# Patient Record
Sex: Male | Born: 1950 | Hispanic: No | Marital: Single | Smoking: Former smoker
Health system: Southern US, Community
[De-identification: ages and names within clinical notes are randomized; demographics above are authoritative.]

## PROBLEM LIST (undated history)

## (undated) DIAGNOSIS — M1990 Unspecified osteoarthritis, unspecified site: Secondary | ICD-10-CM

## (undated) DIAGNOSIS — K625 Hemorrhage of anus and rectum: Secondary | ICD-10-CM

## (undated) DIAGNOSIS — E119 Type 2 diabetes mellitus without complications: Secondary | ICD-10-CM

## (undated) DIAGNOSIS — I10 Essential (primary) hypertension: Secondary | ICD-10-CM

## (undated) DIAGNOSIS — K469 Unspecified abdominal hernia without obstruction or gangrene: Secondary | ICD-10-CM

## (undated) DIAGNOSIS — K7460 Unspecified cirrhosis of liver: Secondary | ICD-10-CM

## (undated) HISTORY — PX: FRACTURE SURGERY: SHX138

---

## 1996-01-24 HISTORY — PX: PELVIC FRACTURE SURGERY: SHX119

## 2000-05-16 ENCOUNTER — Emergency Department (HOSPITAL_COMMUNITY): Admission: EM | Admit: 2000-05-16 | Discharge: 2000-05-16 | Payer: Self-pay | Admitting: Emergency Medicine

## 2000-05-16 ENCOUNTER — Encounter: Payer: Self-pay | Admitting: Emergency Medicine

## 2000-05-30 ENCOUNTER — Encounter: Admission: RE | Admit: 2000-05-30 | Discharge: 2000-05-30 | Payer: Self-pay | Admitting: Hematology and Oncology

## 2000-07-02 ENCOUNTER — Emergency Department (HOSPITAL_COMMUNITY): Admission: EM | Admit: 2000-07-02 | Discharge: 2000-07-02 | Payer: Self-pay

## 2002-11-25 ENCOUNTER — Emergency Department (HOSPITAL_COMMUNITY): Admission: EM | Admit: 2002-11-25 | Discharge: 2002-11-25 | Payer: Self-pay | Admitting: Emergency Medicine

## 2004-04-03 ENCOUNTER — Emergency Department (HOSPITAL_COMMUNITY): Admission: EM | Admit: 2004-04-03 | Discharge: 2004-04-03 | Payer: Self-pay | Admitting: Emergency Medicine

## 2004-04-05 ENCOUNTER — Emergency Department (HOSPITAL_COMMUNITY): Admission: EM | Admit: 2004-04-05 | Discharge: 2004-04-05 | Payer: Self-pay | Admitting: Family Medicine

## 2004-04-09 ENCOUNTER — Emergency Department (HOSPITAL_COMMUNITY): Admission: EM | Admit: 2004-04-09 | Discharge: 2004-04-09 | Payer: Self-pay | Admitting: Emergency Medicine

## 2004-04-18 ENCOUNTER — Emergency Department (HOSPITAL_COMMUNITY): Admission: EM | Admit: 2004-04-18 | Discharge: 2004-04-18 | Payer: Self-pay | Admitting: Emergency Medicine

## 2004-11-09 ENCOUNTER — Emergency Department (HOSPITAL_COMMUNITY): Admission: EM | Admit: 2004-11-09 | Discharge: 2004-11-09 | Payer: Self-pay | Admitting: Emergency Medicine

## 2004-11-12 ENCOUNTER — Emergency Department (HOSPITAL_COMMUNITY): Admission: EM | Admit: 2004-11-12 | Discharge: 2004-11-12 | Payer: Self-pay | Admitting: Emergency Medicine

## 2004-11-13 ENCOUNTER — Emergency Department (HOSPITAL_COMMUNITY): Admission: EM | Admit: 2004-11-13 | Discharge: 2004-11-13 | Payer: Self-pay | Admitting: *Deleted

## 2005-03-10 ENCOUNTER — Emergency Department (HOSPITAL_COMMUNITY): Admission: EM | Admit: 2005-03-10 | Discharge: 2005-03-10 | Payer: Self-pay | Admitting: Family Medicine

## 2005-03-11 ENCOUNTER — Emergency Department (HOSPITAL_COMMUNITY): Admission: EM | Admit: 2005-03-11 | Discharge: 2005-03-11 | Payer: Self-pay | Admitting: Family Medicine

## 2006-01-19 ENCOUNTER — Emergency Department (HOSPITAL_COMMUNITY): Admission: EM | Admit: 2006-01-19 | Discharge: 2006-01-19 | Payer: Self-pay | Admitting: Emergency Medicine

## 2006-03-31 ENCOUNTER — Ambulatory Visit: Payer: Self-pay | Admitting: Internal Medicine

## 2006-03-31 ENCOUNTER — Inpatient Hospital Stay (HOSPITAL_COMMUNITY): Admission: EM | Admit: 2006-03-31 | Discharge: 2006-04-02 | Payer: Self-pay | Admitting: Emergency Medicine

## 2006-04-02 ENCOUNTER — Encounter: Payer: Self-pay | Admitting: Internal Medicine

## 2007-07-20 ENCOUNTER — Emergency Department (HOSPITAL_COMMUNITY): Admission: EM | Admit: 2007-07-20 | Discharge: 2007-07-20 | Payer: Self-pay | Admitting: Emergency Medicine

## 2007-11-24 ENCOUNTER — Emergency Department (HOSPITAL_COMMUNITY): Admission: EM | Admit: 2007-11-24 | Discharge: 2007-11-24 | Payer: Self-pay | Admitting: Emergency Medicine

## 2008-01-30 ENCOUNTER — Encounter: Admission: RE | Admit: 2008-01-30 | Discharge: 2008-01-30 | Payer: Self-pay | Admitting: Occupational Medicine

## 2008-08-23 ENCOUNTER — Emergency Department (HOSPITAL_COMMUNITY): Admission: EM | Admit: 2008-08-23 | Discharge: 2008-08-23 | Payer: Self-pay | Admitting: Emergency Medicine

## 2008-08-29 ENCOUNTER — Emergency Department (HOSPITAL_COMMUNITY): Admission: EM | Admit: 2008-08-29 | Discharge: 2008-08-29 | Payer: Self-pay | Admitting: Emergency Medicine

## 2008-09-20 ENCOUNTER — Emergency Department (HOSPITAL_COMMUNITY): Admission: EM | Admit: 2008-09-20 | Discharge: 2008-09-20 | Payer: Self-pay | Admitting: Emergency Medicine

## 2009-05-06 ENCOUNTER — Emergency Department (HOSPITAL_COMMUNITY): Admission: EM | Admit: 2009-05-06 | Discharge: 2009-05-06 | Payer: Self-pay | Admitting: Emergency Medicine

## 2009-05-17 ENCOUNTER — Emergency Department (HOSPITAL_COMMUNITY): Admission: EM | Admit: 2009-05-17 | Discharge: 2009-05-17 | Payer: Self-pay | Admitting: Emergency Medicine

## 2009-11-04 ENCOUNTER — Emergency Department (HOSPITAL_COMMUNITY): Admission: EM | Admit: 2009-11-04 | Discharge: 2009-11-04 | Payer: Self-pay | Admitting: Emergency Medicine

## 2010-02-08 ENCOUNTER — Emergency Department (HOSPITAL_COMMUNITY)
Admission: EM | Admit: 2010-02-08 | Discharge: 2010-02-09 | Payer: Self-pay | Source: Home / Self Care | Admitting: Emergency Medicine

## 2010-02-09 LAB — CBC
HCT: 41.7 % (ref 39.0–52.0)
Hemoglobin: 14.5 g/dL (ref 13.0–17.0)
MCH: 31.3 pg (ref 26.0–34.0)
MCHC: 34.8 g/dL (ref 30.0–36.0)
MCV: 89.9 fL (ref 78.0–100.0)
Platelets: 128 10*3/uL — ABNORMAL LOW (ref 150–400)
RBC: 4.64 MIL/uL (ref 4.22–5.81)
RDW: 13.4 % (ref 11.5–15.5)
WBC: 4.5 10*3/uL (ref 4.0–10.5)

## 2010-02-09 LAB — DIFFERENTIAL
Basophils Absolute: 0 10*3/uL (ref 0.0–0.1)
Basophils Relative: 1 % (ref 0–1)
Eosinophils Absolute: 0.1 10*3/uL (ref 0.0–0.7)
Eosinophils Relative: 1 % (ref 0–5)
Lymphocytes Relative: 35 % (ref 12–46)
Lymphs Abs: 1.6 10*3/uL (ref 0.7–4.0)
Monocytes Absolute: 0.5 10*3/uL (ref 0.1–1.0)
Monocytes Relative: 12 % (ref 3–12)
Neutro Abs: 2.3 10*3/uL (ref 1.7–7.7)
Neutrophils Relative %: 51 % (ref 43–77)

## 2010-02-09 LAB — BASIC METABOLIC PANEL
BUN: 10 mg/dL (ref 6–23)
CO2: 25 mEq/L (ref 19–32)
Calcium: 8.5 mg/dL (ref 8.4–10.5)
Chloride: 98 mEq/L (ref 96–112)
Creatinine, Ser: 0.72 mg/dL (ref 0.4–1.5)
GFR calc Af Amer: >60 mL/min (ref >60)
GFR calc non Af Amer: >60 mL/min (ref >60)
Glucose, Bld: 97 mg/dL (ref 70–99)
Potassium: 4.1 mEq/L (ref 3.5–5.1)
Sodium: 130 mEq/L — ABNORMAL LOW (ref 135–145)

## 2010-02-09 LAB — URINALYSIS, ROUTINE W REFLEX MICROSCOPIC
Bilirubin Urine: NEGATIVE
Hgb urine dipstick: NEGATIVE
Ketones, ur: NEGATIVE mg/dL
Nitrite: NEGATIVE
Protein, ur: NEGATIVE mg/dL
Specific Gravity, Urine: 1.038 — ABNORMAL HIGH (ref 1.005–1.030)
Urine Glucose, Fasting: NEGATIVE mg/dL
Urobilinogen, UA: 0.2 mg/dL (ref 0.0–1.0)
pH: 6 (ref 5.0–8.0)

## 2010-06-10 NOTE — Discharge Summary (Signed)
[name], [name]                ACCOUNT NO.:  [id number]   MEDICAL RECORD NO.:  [id number]          PATIENT TYPE:  INP   LOCATION:  2016                         FACILITY:  [facility]   PHYSICIAN:  Gardiner Barefoot, MD    DATE OF BIRTH:  08-28-1950   DATE OF ADMISSION:  03/31/2006  DATE OF DISCHARGE:  04/02/2006                               DISCHARGE SUMMARY   REASON FOR ADMISSION:  Chest pain.   DISCHARGE DIAGNOSIS:  Resolved chest pain secondary to crack cocaine  use.   HISTORY OF PRESENT ILLNESS:  Please see dictated history and physical  already dictated.  Briefly, this is a 60 year old male with no known  cardiac history and no physician who presented with about a two week  history of this malaise and flu-like symptoms, some subjective fevers,  sweats and some weakness and had unstable angina symptoms with 6-7/10  retrosternal chest pain and some shortness of breath.  The patient said  that occurred prior 4-5 days prior to coming into the hospital and was  not pleuritic in nature and would last about 15-20 minutes and then  resolve spontaneously.  He continued to work as a Scientist, water quality, doing  heavy exertion, but was limited by the chest pain.  In evaluation in the  emergency room, his EKG was noted to have no acute ST changes although  one EKG did report some PACs and had a bradycardiac episode after a  nitroglycerin secondary to vasovagal event which responded well to  atropine.  The patient denied any significant risk factors for cardiac  disease initially with admitting to just about 1 cigarette weekly,  although a significant history of smoking and denied any alcohol or drug  use.  However, the patient upon further questioning by myself and the  Cardiologist it was revealed that the patient has had a recent history  of crack cocaine use and in fact his urine drug screen was positive for  cocaine this hospitalization.  The patient was then admitted for rule  out of cardiac  disease and risk stratification.   ALLERGIES:  No known drug allergies.   MEDICATIONS:  No home medications prior to the hospitalization.   HOSPITAL COURSE:  The patient was admitted and ruled out with negative  enzymes and had an echocardiogram which was significant for diastolic  dysfunction with an EF of 65-70% and mildly increased left ventricular  wall thickness.  Otherwise no pericardial effusion or other  abnormalities noted.  The patient remained chest pain-free throughout  his hospital stay and Cardiology did feel that this would unlikely be a  cardiac origin of chest pain, it is more likely secondary to drug use  and was resolving.  The patient also was seen by the social worker which  gave him contacts for drug and alcohol rehabilitation.  The patient the  day of discharge was eager to leave, in fact was becoming agitated with  nursing staff and it was felt that there was no further need for in  patient work up of his noncardiac chest pain.  Additionally, the patient  was  found to be smoking in his bathroom in the hospital room.  The  patient was given contact information for the primary care Maleena Eddleman and  encouraged to follow up and continue with a daily aspirin.   For hypertriglyceridemia:  The patient was noted to have significantly  elevated hypertriglycerides in the 600s, however, also with elevated  LFTs likely secondary to his chronic alcohol abuse.  The fibrin was not  started in hospital due to his increased LFTs but it was recommended  that he have follow up with his primary care physician about his  triglycerides for question of starting gemfibrozil.   The patient was stable upon discharge, in no pain and acknowledged his  need to follow up closely with a new physician.      Gardiner Barefoot, MD  Electronically Signed     RWC/MEDQ  D:  04/03/2006  T:  04/05/2006  Job:  [id number]
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
## 2010-07-10 ENCOUNTER — Emergency Department (HOSPITAL_COMMUNITY)
Admission: EM | Admit: 2010-07-10 | Discharge: 2010-07-11 | Disposition: A | Discharge status: Home / Self Care | Payer: Self-pay | Attending: Emergency Medicine | Admitting: Emergency Medicine

## 2010-07-10 DIAGNOSIS — M25559 Pain in unspecified hip: Secondary | ICD-10-CM | POA: Insufficient documentation

## 2010-07-10 DIAGNOSIS — X58XXXA Exposure to other specified factors, initial encounter: Secondary | ICD-10-CM | POA: Insufficient documentation

## 2010-07-10 DIAGNOSIS — S139XXA Sprain of joints and ligaments of unspecified parts of neck, initial encounter: Secondary | ICD-10-CM | POA: Insufficient documentation

## 2010-07-10 DIAGNOSIS — M533 Sacrococcygeal disorders, not elsewhere classified: Secondary | ICD-10-CM | POA: Insufficient documentation

## 2010-07-10 DIAGNOSIS — M542 Cervicalgia: Secondary | ICD-10-CM | POA: Insufficient documentation

## 2010-07-11 ENCOUNTER — Emergency Department (HOSPITAL_COMMUNITY): Payer: Self-pay

## 2010-11-12 ENCOUNTER — Emergency Department (HOSPITAL_COMMUNITY)
Admission: EM | Admit: 2010-11-12 | Discharge: 2010-11-13 | Disposition: A | Discharge status: Home / Self Care | Payer: Self-pay | Attending: Emergency Medicine | Admitting: Emergency Medicine

## 2010-11-12 DIAGNOSIS — M25559 Pain in unspecified hip: Secondary | ICD-10-CM | POA: Insufficient documentation

## 2010-11-12 DIAGNOSIS — M76899 Other specified enthesopathies of unspecified lower limb, excluding foot: Secondary | ICD-10-CM | POA: Insufficient documentation

## 2010-11-12 DIAGNOSIS — F411 Generalized anxiety disorder: Secondary | ICD-10-CM | POA: Insufficient documentation

## 2010-11-13 ENCOUNTER — Emergency Department (HOSPITAL_COMMUNITY): Payer: Self-pay

## 2010-12-29 ENCOUNTER — Encounter: Payer: Self-pay | Admitting: Emergency Medicine

## 2010-12-29 ENCOUNTER — Emergency Department (HOSPITAL_COMMUNITY)
Admission: EM | Admit: 2010-12-29 | Discharge: 2010-12-30 | Disposition: A | Discharge status: Home / Self Care | Payer: Self-pay | Attending: Emergency Medicine | Admitting: Emergency Medicine

## 2010-12-29 DIAGNOSIS — L0291 Cutaneous abscess, unspecified: Secondary | ICD-10-CM

## 2010-12-29 DIAGNOSIS — L0231 Cutaneous abscess of buttock: Secondary | ICD-10-CM | POA: Insufficient documentation

## 2010-12-29 DIAGNOSIS — L0390 Cellulitis, unspecified: Secondary | ICD-10-CM

## 2010-12-29 NOTE — ED Notes (Signed)
PT. REPORTS ABSCESS AT RIGHT BUTTOCK FOR 4 DAYS WITH DRAINAGE , LOW GRADE FEVER AND SLIGHT CHILLS.

## 2010-12-30 MED ORDER — DOXYCYCLINE HYCLATE 100 MG PO TABS
100.0000 mg | ORAL_TABLET | Freq: Once | ORAL | Status: AC
  Administered 2010-12-30: 100 mg via ORAL
  Filled 2010-12-30: qty 1

## 2010-12-30 MED ORDER — HYDROCODONE-ACETAMINOPHEN 5-325 MG PO TABS: 2.0000 | ORAL_TABLET | Freq: Four times a day (QID) | ORAL | Status: DC | PRN

## 2010-12-30 MED ORDER — OXYCODONE-ACETAMINOPHEN 5-325 MG PO TABS
2.0000 | ORAL_TABLET | Freq: Once | ORAL | Status: AC
  Administered 2010-12-30: 2 via ORAL
  Filled 2010-12-30: qty 2

## 2010-12-30 MED ORDER — DOXYCYCLINE HYCLATE 100 MG PO CAPS: 100.0000 mg | ORAL_CAPSULE | Freq: Two times a day (BID) | ORAL | Status: DC

## 2010-12-30 MED ORDER — LIDOCAINE HCL (PF) 1 % IJ SOLN
2.0000 mL | Freq: Once | INTRAMUSCULAR | Status: AC
  Administered 2010-12-30: 2 mL via INTRADERMAL
  Filled 2010-12-30: qty 5

## 2010-12-30 NOTE — ED Provider Notes (Signed)
History     CSN: [id number] Arrival date & time: 12/29/2010  9:14 PM   First MD Initiated Contact with Patient 12/29/10 2342      Chief Complaint  Patient presents with  . Abscess    (Consider location/radiation/quality/duration/timing/severity/associated sxs/prior treatment) HPI Comments: [name] has an abscess on his right buttock at the gluteal fold, but not involving the anus.  This is been progressive over the last 4 days.  He is tried warm soaks and over-the-counter ibuprofen or Tylenol without relief.  Denies previous history of abscesses  Patient is a 60 y.o. male presenting with abscess. The history is provided by the patient.  Abscess  This is a new problem. The current episode started less than one week ago. The problem occurs rarely. The problem has been unchanged. The abscess is present on the right buttock. The problem is moderate. The abscess is characterized by redness and painfulness. The patient was exposed to OTC medications. The abscess first occurred at home. Pertinent negatives include no fever. There were no sick contacts. He has received no recent medical care.    History reviewed. No pertinent past medical history.  History reviewed. No pertinent past surgical history.  No family history on file.  History  Substance Use Topics  . Smoking status: Never Smoker   . Smokeless tobacco: Not on file  . Alcohol Use: Yes      Review of Systems  Constitutional: Negative for fever.  HENT: Negative.   Eyes: Negative.   Respiratory: Negative.   Gastrointestinal: Negative for blood in stool, abdominal distention, anal bleeding and rectal pain.  Genitourinary: Negative.   Musculoskeletal: Negative.   Skin: Positive for wound.  Neurological: Negative.   Hematological: Negative.   Psychiatric/Behavioral: Negative.     Allergies  Review of patient's allergies indicates no known allergies.  Home Medications   Current Outpatient Rx  Name Route Sig  Dispense Refill  . DOXYCYCLINE HYCLATE 100 MG PO CAPS Oral Take 1 capsule (100 mg total) by mouth 2 (two) times daily. 20 capsule 0  . HYDROCODONE-ACETAMINOPHEN 5-325 MG PO TABS Oral Take 2 tablets by mouth every 6 (six) hours as needed for pain. 30 tablet 0    BP 141/107  Pulse 95  Temp(Src) 97.8 F (36.6 C) (Oral)  Resp 18  SpO2 96%  Physical Exam  Constitutional: He is oriented to person, place, and time. He appears well-developed and well-nourished.  Eyes: Pupils are equal, round, and reactive to light.  Neck: Normal range of motion.  Cardiovascular: Normal rate.   Pulmonary/Chest: Effort normal.  Abdominal: Soft.  Musculoskeletal: Normal range of motion.  Neurological: He is oriented to person, place, and time.  Skin:       Small fluctuant area surrounded by 6 cm round area of firm cellulitis   Psychiatric: He has a normal mood and affect.    ED Course  INCISION AND DRAINAGE Date/Time: 12/30/2010 1:17 AM Performed by: Arman Filter Authorized by: Arman Filter Consent: Verbal consent obtained. Consent given by: patient Patient understanding: patient states understanding of the procedure being performed Site marked: the operative site was not marked Required items: required blood products, implants, devices, and special equipment available Patient identity confirmed: verbally with patient Time out: Immediately prior to procedure a "time out" was called to verify the correct patient, procedure, equipment, support staff and site/side marked as required. Type: abscess Body area: anogenital Anesthesia: local infiltration Local anesthetic: lidocaine 1% without epinephrine Patient sedated: no Scalpel size:  11 Needle gauge: 22 Incision type: single straight Complexity: simple Drainage: purulent Drainage amount: moderate Wound treatment: drain placed Packing material: 1/4 in iodoform gauze Patient tolerance: Patient tolerated the procedure well with no immediate  complications.   (including critical care time)  Labs Reviewed - No data to display No results found.   1. Abscess and cellulitis       MDM  Abscess with cellulitis will I&D prescribed antibiotics, pain control and have patient return in 2 days for re\re examination of the wound as he has no primary care doctor        Arman Filter, NP 12/30/10 0119  Arman Filter, NP 12/30/10 0120

## 2010-12-30 NOTE — ED Notes (Signed)
Pt c/o abscess to R buttock.  Redness and pus draining from R buttock.

## 2010-12-30 NOTE — ED Provider Notes (Signed)
Medical screening examination/treatment/procedure(s) were performed by non-physician practitioner and as supervising physician I was immediately available for consultation/collaboration.  Kendel Pesnell M Chirag Krueger, MD 12/30/10 1746 

## 2010-12-30 NOTE — ED Notes (Signed)
Dry dressing placed on pt.

## 2010-12-31 ENCOUNTER — Encounter (HOSPITAL_COMMUNITY): Payer: Self-pay | Admitting: Emergency Medicine

## 2010-12-31 ENCOUNTER — Emergency Department (HOSPITAL_COMMUNITY)
Admission: EM | Admit: 2010-12-31 | Discharge: 2010-12-31 | Disposition: A | Discharge status: Home / Self Care | Payer: Self-pay | Attending: Emergency Medicine | Admitting: Emergency Medicine

## 2010-12-31 DIAGNOSIS — Z09 Encounter for follow-up examination after completed treatment for conditions other than malignant neoplasm: Secondary | ICD-10-CM | POA: Insufficient documentation

## 2010-12-31 DIAGNOSIS — IMO0001 Reserved for inherently not codable concepts without codable children: Secondary | ICD-10-CM | POA: Insufficient documentation

## 2010-12-31 DIAGNOSIS — L0231 Cutaneous abscess of buttock: Secondary | ICD-10-CM | POA: Insufficient documentation

## 2010-12-31 MED ORDER — HYDROCODONE-ACETAMINOPHEN 5-325 MG PO TABS
2.0000 | ORAL_TABLET | Freq: Once | ORAL | Status: AC
  Administered 2010-12-31: 2 via ORAL
  Filled 2010-12-31: qty 2

## 2010-12-31 MED ORDER — HYDROCODONE-ACETAMINOPHEN 5-500 MG PO TABS: 1.0000 | ORAL_TABLET | Freq: Four times a day (QID) | ORAL | Status: AC | PRN

## 2010-12-31 NOTE — ED Provider Notes (Addendum)
History     CSN: [id number] Arrival date & time: 12/31/2010  6:26 PM   First MD Initiated Contact with Patient 12/31/10 1949      Chief Complaint  Patient presents with  . Wound Check    Right buttock area    (Consider location/radiation/quality/duration/timing/severity/associated sxs/prior treatment) Patient is a 60 y.o. male presenting with wound check. The history is provided by the patient.  Wound Check   pt had I and D of right buttock abscess 2 days ago. Presents for recheck. Says pain improved, but persists. No fever or chills. Still taking antibiotic. Does not feel ill, no weakness, no nv, no chills/sweats. States is almost out of pain meds. Constant, dull soreness to area, no radiation, worse w palpation, but improved from prior.   History reviewed. No pertinent past medical history.  Past Surgical History  Procedure Date  . Pelvic fracture surgery     History reviewed. No pertinent family history.  History  Substance Use Topics  . Smoking status: Never Smoker   . Smokeless tobacco: Not on file  . Alcohol Use: Yes      Review of Systems  Constitutional: Negative for fever and chills.  Gastrointestinal: Negative for abdominal pain.  Musculoskeletal: Negative for myalgias.  Skin: Negative for rash.    Allergies  Review of patient's allergies indicates no known allergies.  Home Medications   Current Outpatient Rx  Name Route Sig Dispense Refill  . DOXYCYCLINE HYCLATE 100 MG PO CAPS Oral Take 100 mg by mouth 2 (two) times daily.      Marland Kitchen HYDROCODONE-ACETAMINOPHEN 5-325 MG PO TABS Oral Take 2 tablets by mouth every 6 (six) hours as needed. For pain       BP 145/100  Pulse 86  Temp(Src) 98 F (36.7 C) (Oral)  Resp 20  SpO2 96%  Physical Exam  Nursing note and vitals reviewed. Constitutional: He is oriented to person, place, and time. He appears well-developed and well-nourished. No distress.  HENT:  Head: Atraumatic.  Eyes: Pupils are equal, round,  and reactive to light.  Neck: Neck supple. No tracheal deviation present.  Cardiovascular: Normal rate.   Pulmonary/Chest: Effort normal. No accessory muscle usage. No respiratory distress.  Abdominal: Soft. He exhibits no distension. There is no tenderness.  Musculoskeletal: Normal range of motion. He exhibits no edema.  Neurological: He is alert and oriented to person, place, and time.  Skin: Skin is warm and dry.       Drained abscess to right buttock. Area of erythema much improved as compared to recent prior exam. No fluctuance.   Psychiatric: He has a normal mood and affect.    ED Course  Procedures (including critical care time)     MDM   No fluctuance and area of erythema much improved. Pt requests small quantity pain med.    Pt says has ride, did not drive to ed. No pain meds pta. vicodin po.    Suzi Roots, MD 12/31/10 1953  Suzi Roots, MD 12/31/10 [phone]

## 2010-12-31 NOTE — ED Notes (Signed)
Pt reports boil to right buttock area x 7 days.

## 2011-01-25 ENCOUNTER — Emergency Department (HOSPITAL_COMMUNITY)
Admission: EM | Admit: 2011-01-25 | Discharge: 2011-01-26 | Disposition: A | Discharge status: Home / Self Care | Payer: Self-pay | Attending: Emergency Medicine | Admitting: Emergency Medicine

## 2011-01-25 ENCOUNTER — Encounter (HOSPITAL_COMMUNITY): Payer: Self-pay | Admitting: *Deleted

## 2011-01-25 ENCOUNTER — Emergency Department (HOSPITAL_COMMUNITY): Payer: Self-pay

## 2011-01-25 DIAGNOSIS — R61 Generalized hyperhidrosis: Secondary | ICD-10-CM | POA: Insufficient documentation

## 2011-01-25 DIAGNOSIS — M549 Dorsalgia, unspecified: Secondary | ICD-10-CM | POA: Insufficient documentation

## 2011-01-25 DIAGNOSIS — G43909 Migraine, unspecified, not intractable, without status migrainosus: Secondary | ICD-10-CM | POA: Insufficient documentation

## 2011-01-25 DIAGNOSIS — R51 Headache: Secondary | ICD-10-CM

## 2011-01-25 DIAGNOSIS — B9789 Other viral agents as the cause of diseases classified elsewhere: Secondary | ICD-10-CM | POA: Insufficient documentation

## 2011-01-25 DIAGNOSIS — B349 Viral infection, unspecified: Secondary | ICD-10-CM

## 2011-01-25 LAB — DIFFERENTIAL
Basophils Absolute: 0 10*3/uL (ref 0.0–0.1)
Basophils Relative: 1 % (ref 0–1)
Eosinophils Absolute: 0.1 10*3/uL (ref 0.0–0.7)
Eosinophils Relative: 2 % (ref 0–5)
Lymphocytes Relative: 40 % (ref 12–46)
Lymphs Abs: 2.1 10*3/uL (ref 0.7–4.0)
Monocytes Absolute: 0.6 10*3/uL (ref 0.1–1.0)
Monocytes Relative: 11 % (ref 3–12)
Neutro Abs: 2.4 10*3/uL (ref 1.7–7.7)
Neutrophils Relative %: 46 % (ref 43–77)

## 2011-01-25 LAB — BASIC METABOLIC PANEL
BUN: 10 mg/dL (ref 6–23)
CO2: 26 mEq/L (ref 19–32)
Calcium: 9.4 mg/dL (ref 8.4–10.5)
Chloride: 104 mEq/L (ref 96–112)
Creatinine, Ser: 0.73 mg/dL (ref 0.50–1.35)
GFR calc Af Amer: >90 mL/min (ref >90)
GFR calc non Af Amer: >90 mL/min (ref >90)
Glucose, Bld: 115 mg/dL — ABNORMAL HIGH (ref 70–99)
Potassium: 4.1 mEq/L (ref 3.5–5.1)
Sodium: 139 mEq/L (ref 135–145)

## 2011-01-25 LAB — CBC
HCT: 41.2 % (ref 39.0–52.0)
Hemoglobin: 14.4 g/dL (ref 13.0–17.0)
MCH: 31.8 pg (ref 26.0–34.0)
MCHC: 35 g/dL (ref 30.0–36.0)
MCV: 90.9 fL (ref 78.0–100.0)
Platelets: 136 10*3/uL — ABNORMAL LOW (ref 150–400)
RBC: 4.53 MIL/uL (ref 4.22–5.81)
RDW: 12.6 % (ref 11.5–15.5)
WBC: 5.3 10*3/uL (ref 4.0–10.5)

## 2011-01-25 MED ORDER — DEXAMETHASONE SODIUM PHOSPHATE 10 MG/ML IJ SOLN
10.0000 mg | Freq: Once | INTRAMUSCULAR | Status: AC
  Administered 2011-01-25: 10 mg via INTRAVENOUS
  Filled 2011-01-25: qty 1

## 2011-01-25 MED ORDER — METOCLOPRAMIDE HCL 5 MG/ML IJ SOLN
10.0000 mg | Freq: Once | INTRAMUSCULAR | Status: AC
  Administered 2011-01-25: 10 mg via INTRAVENOUS
  Filled 2011-01-25: qty 2

## 2011-01-25 MED ORDER — SODIUM CHLORIDE 0.9 % IV BOLUS (SEPSIS)
1000.0000 mL | Freq: Once | INTRAVENOUS | Status: AC
  Administered 2011-01-25: 1000 mL via INTRAVENOUS

## 2011-01-25 MED ORDER — KETOROLAC TROMETHAMINE 30 MG/ML IJ SOLN
30.0000 mg | Freq: Once | INTRAMUSCULAR | Status: AC
  Administered 2011-01-25: 30 mg via INTRAVENOUS
  Filled 2011-01-25: qty 1

## 2011-01-25 MED ORDER — DIPHENHYDRAMINE HCL 50 MG/ML IJ SOLN
25.0000 mg | Freq: Once | INTRAMUSCULAR | Status: AC
  Administered 2011-01-25: 50 mg via INTRAVENOUS
  Filled 2011-01-25: qty 1

## 2011-01-25 NOTE — ED Notes (Signed)
When pt given mask, pt mentions: "some blood when he blows nose".

## 2011-01-25 NOTE — ED Notes (Signed)
MD at bedside to discuss plan of care

## 2011-01-25 NOTE — ED Notes (Signed)
Patient transported to CT stable and in no acute distress.

## 2011-01-25 NOTE — ED Notes (Addendum)
Pt reports that he has had a migraine headache x 1 week, states nothing makes worse or better, with bilateral lower back pain radiating around to hips. Reports walking increases pain. Pt able to move extremities, no numbness or tingling.

## 2011-01-25 NOTE — ED Notes (Signed)
Pt reports feeling better. Headache now 6/10 and patient is resting with eyes closed. SR up. No acute distress is noted.
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
## 2011-01-25 NOTE — ED Notes (Signed)
Pt now admitting to the rad tech, "yes, I have had cough, congestion & sob for ~ 1 week".

## 2011-01-25 NOTE — ED Provider Notes (Signed)
History     CSN: [id number]  Arrival date & time 01/25/11  4098   First MD Initiated Contact with Patient 01/25/11 2055      Chief Complaint  Patient presents with  . Migraine  . Back Pain    (Consider location/radiation/quality/duration/timing/severity/associated sxs/prior treatment) Patient is a 61 y.o. male presenting with migraine and back pain. The history is provided by the patient.  Migraine This is a new problem. Episode onset: about a week ago. Episode frequency: on and off. The problem has been gradually worsening. Associated symptoms include headaches. Pertinent negatives include no chest pain, no abdominal pain and no shortness of breath. The symptoms are aggravated by nothing. The symptoms are relieved by nothing. He has tried acetaminophen for the symptoms. The treatment provided mild relief.  Back Pain  Associated symptoms include headaches. Pertinent negatives include no chest pain, no numbness, no abdominal pain, no dysuria and no weakness.   Patient has chronic back pain status post remote injury.  Using tonight with on and off headache and some flulike symptoms. The taking Tylenol at home intermittent relief. He has remote history of TB does complain of some night sweats but no productive sputum no hemoptysis no recorded fevers. No recent rash or hospitalization or travel. He did not get a flu shot this season. He has no known sick contacts. He has a weakness or numbness. He denies any neck pain or stiffness. Denies any difficulty with speech or with ambulation. He does not have a primary care physician but states he is able to followup at [facility] if needed History reviewed. No pertinent past medical history.  Past Surgical History  Procedure Date  . Pelvic fracture surgery     History reviewed. No pertinent family history.  History  Substance Use Topics  . Smoking status: Former Games developer  . Smokeless tobacco: Not on file  . Alcohol Use: Yes       Review of Systems  Constitutional: Negative for appetite change and fatigue.  HENT: Negative for sore throat, mouth sores, neck pain and neck stiffness.   Eyes: Negative for pain.  Respiratory: Negative for shortness of breath.   Cardiovascular: Negative for chest pain.  Gastrointestinal: Negative for nausea, vomiting and abdominal pain.  Genitourinary: Negative for dysuria.  Musculoskeletal: Positive for back pain.  Skin: Negative for rash.  Neurological: Positive for headaches. Negative for dizziness, tremors, seizures, syncope, speech difficulty, weakness and numbness.  All other systems reviewed and are negative.    Allergies  Review of patient's allergies indicates no known allergies.  Home Medications  No current outpatient prescriptions on file.  BP 140/89  Pulse 70  Temp(Src) 97.3 F (36.3 C) (Oral)  Resp 20  SpO2 98%  Physical Exam  Constitutional: He is oriented to person, place, and time. He appears well-developed and well-nourished.  HENT:  Head: Normocephalic and atraumatic.  Eyes: Conjunctivae and EOM are normal. Pupils are equal, round, and reactive to light.  Neck: Full passive range of motion without pain. Neck supple. No thyromegaly present.       No meningismus  Cardiovascular: Normal rate, regular rhythm, S1 normal, S2 normal and intact distal pulses.   Pulmonary/Chest: Effort normal and breath sounds normal.  Abdominal: Soft. Bowel sounds are normal. There is no tenderness. There is no CVA tenderness.  Musculoskeletal: Normal range of motion.  Neurological: He is alert and oriented to person, place, and time. He has normal strength and normal reflexes. No cranial nerve deficit or sensory  deficit. He displays a negative Romberg sign. GCS eye subscore is 4. GCS verbal subscore is 5. GCS motor subscore is 6.       Normal Gait  Skin: Skin is warm and dry. No rash noted. No cyanosis. Nails show no clubbing.  Psychiatric: He has a normal mood and  affect. His speech is normal and behavior is normal.    ED Course  Procedures (including critical care time)  Labs Reviewed  CBC - Abnormal; Notable for the following:    Platelets 136 (*)    All other components within normal limits  BASIC METABOLIC PANEL - Abnormal; Notable for the following:    Glucose, Bld 115 (*)    All other components within normal limits  DIFFERENTIAL   Dg Chest 2 View  01/25/2011  *RADIOLOGY REPORT*  Clinical Data: Shortness of breath, cough, night sweats.  History of TB.  CHEST - 2 VIEW  Comparison: 01/30/2008  Findings: The heart size and pulmonary vascularity are normal. The lungs appear clear and expanded without focal air space disease or consolidation. No blunting of the costophrenic angles.  No pneumothorax.  No findings to suggest active TB.  No significant change since previous study.  IMPRESSION: No evidence of active pulmonary disease.  Original Report Authenticated By: Marlon Pel, M.D.   Ct Head Wo Contrast  01/25/2011  *RADIOLOGY REPORT*  Clinical Data: Migraine headache for week.  Low back pain radiating to the hips.  CT HEAD WITHOUT CONTRAST  Technique:  Contiguous axial images were obtained from the base of the skull through the vertex without contrast.  Comparison: 02/08/2010  Findings: Mild diffuse cerebral atrophy.  Mild ventricular dilatation consistent with central atrophy.  No mass effect or midline shift.  No abnormal extra-axial fluid collections.  The gray-white matter junctions are distinct.  The basal cisterns are not effaced.  No evidence of acute intracranial hemorrhage.  No depressed skull fractures.  Visualized paranasal sinuses are are not opacified.  No significant change since previous study.  IMPRESSION: Mild cerebral atrophy.  No evidence of acute intracranial hemorrhage, mass lesion, or acute infarct.  Original Report Authenticated By: Marlon Pel, M.D.    Labs. His x-ray. CT brain as above. IV fluids and had a  cocktail. Recheck at 11:45 PM still in much better and wishing to be discharged home. Patient states she can followup at the health department.  He was also [facility]   MDM   Headache and flulike symptoms last week. Currently vital signs within normal limits and afebrile. No meningismus. No findings on exam to suggest active TB and has already been treated for the same.  On recheck is improved with exam unchanged stable for discharge home.        Sunnie Nielsen, MD 01/26/11 [phone]

## 2011-01-25 NOTE — ED Notes (Signed)
Report given to [facility], RNs, no further questions asked, "no more information needed".

## 2011-01-25 NOTE — ED Provider Notes (Signed)
Pt presents w/ multiple complaints. 2 week history of severe headache. Has had headaches in the past but none as severe as this headache. Patient also reports increasing low back pain over the last several days. Remote history of pelvic fractures, states has had problems with his back since this injury. Denies any recent injury. Patient also complains of flu-like symptoms for one week. States he's had night sweats and possible fever. Patient denies cough but noted to be coughing. Patient denies shortness of breath or sinus congestion. However radiology technician entered patient's room and ask same question to which pt answered yes. Reports history of TB approximately one year ago while incarcerated, states received complete treatment. BBS CTA, denies any neurological symptoms and his neurological exam is without focal findings. Will medicate for headache and pain and move to acute side for further evaluation.  Leanne Chang, NP 01/25/11 [phone]

## 2011-01-26 MED ORDER — ACETAMINOPHEN-CODEINE 120-12 MG/5ML PO SUSP: 5.0000 mL | Freq: Four times a day (QID) | ORAL | Status: AC | PRN

## 2011-01-26 NOTE — ED Provider Notes (Signed)
Medical screening examination/treatment/procedure(s) were performed by non-physician practitioner and as supervising physician I was immediately available for consultation/collaboration.  Raeford Razor, MD 01/26/11 1511

## 2011-06-18 ENCOUNTER — Encounter (HOSPITAL_COMMUNITY): Payer: Self-pay | Admitting: Family Medicine

## 2011-06-18 ENCOUNTER — Emergency Department (HOSPITAL_COMMUNITY): Payer: Self-pay

## 2011-06-18 ENCOUNTER — Emergency Department (HOSPITAL_COMMUNITY)
Admission: EM | Admit: 2011-06-18 | Discharge: 2011-06-18 | Disposition: A | Discharge status: Home / Self Care | Payer: Self-pay | Attending: Emergency Medicine | Admitting: Emergency Medicine

## 2011-06-18 DIAGNOSIS — G8929 Other chronic pain: Secondary | ICD-10-CM | POA: Insufficient documentation

## 2011-06-18 DIAGNOSIS — M25559 Pain in unspecified hip: Secondary | ICD-10-CM | POA: Insufficient documentation

## 2011-06-18 DIAGNOSIS — Z87891 Personal history of nicotine dependence: Secondary | ICD-10-CM | POA: Insufficient documentation

## 2011-06-18 DIAGNOSIS — I10 Essential (primary) hypertension: Secondary | ICD-10-CM | POA: Insufficient documentation

## 2011-06-18 HISTORY — DX: Essential (primary) hypertension: I10

## 2011-06-18 MED ORDER — OXYCODONE-ACETAMINOPHEN 5-325 MG PO TABS: 1.0000 | ORAL_TABLET | ORAL | Status: AC | PRN

## 2011-06-18 MED ORDER — OXYCODONE-ACETAMINOPHEN 5-325 MG PO TABS
1.0000 | ORAL_TABLET | Freq: Once | ORAL | Status: AC
  Administered 2011-06-18: 1 via ORAL
  Filled 2011-06-18: qty 1

## 2011-06-18 NOTE — ED Notes (Signed)
Pt sts right hip pain for about 3 days. Pt had pins in pelvis that were placed in 1990 something.

## 2011-06-18 NOTE — ED Provider Notes (Signed)
History     CSN: [id number]  Arrival date & time 06/18/11  1130   First MD Initiated Contact with Patient 06/18/11 1140      Chief Complaint  Patient presents with  . Hip Pain    The history is provided by the patient. No language interpreter was used.    [name] is a 61 y.o. male who presents to the Emergency Department complaining of gradual onset moderate severe localized Right hip pain onset 3 days ago without injury, denying cough, back pain, and fever. Pt has a history of hypertension and pelvic fracture surgery.  His pain is moderate to severe.  He's had trauma and orthopedic procedures back in the 1990s    Past Medical History  Diagnosis Date  . Hypertension     Past Surgical History  Procedure Date  . Pelvic fracture surgery     History reviewed. No pertinent family history.  History  Substance Use Topics  . Smoking status: Former Games developer  . Smokeless tobacco: Not on file  . Alcohol Use: Yes      Review of Systems  Constitutional: Negative for fever and chills.  Respiratory: Negative for shortness of breath.   Gastrointestinal: Negative for nausea and vomiting.  Musculoskeletal: Negative for back pain.       Right Hip pain  Neurological: Negative for weakness.    Allergies  Review of patient's allergies indicates no known allergies.  Home Medications  No current outpatient prescriptions on file.  BP 153/109  Pulse 75  Temp(Src) 97.8 F (36.6 C) (Oral)  Resp 19  SpO2 97%  Physical Exam  Nursing note and vitals reviewed. Constitutional: He is oriented to person, place, and time. He appears well-developed and well-nourished. No distress.  HENT:  Head: Normocephalic and atraumatic.  Eyes: EOM are normal. Pupils are equal, round, and reactive to light.  Neck: Neck supple. No tracheal deviation present.  Cardiovascular: Normal rate.   Pulmonary/Chest: Effort normal. No respiratory distress.       Normal DT and DP pulse in right leg    Abdominal: Soft. He exhibits no distension.  Musculoskeletal: Normal range of motion. He exhibits no edema.       Normal ROM of R leg and R knee  Neurological: He is alert and oriented to person, place, and time. No sensory deficit.  Skin: Skin is warm and dry.  Psychiatric: He has a normal mood and affect. His behavior is normal.    ED Course  Procedures (including critical care time) DIAGNOSTIC STUDIES: Oxygen Saturation is 97% on room air, normal by my interpretation.    COORDINATION OF CARE:  Labs Reviewed - No data to display No results found.   1. Chronic right hip pain      MDM  This is likely ongoing chronic right hip pain with acute exacerbation.  He no longer follows with his primary care physician he is to write him a chronic oxycodone.  The patient be written a short course of pain medicine but he was instructed to develop a relationship with her primary care physician.  He was informed that this would not be the place for treatment for ongoing chronic pain.  His plain films are without significant change and no acute injury/fracture   I personally performed the services described in this documentation, which was scribed in my presence. The recorded information has been reviewed and considered.        Lyanne Co, MD 06/18/11 [phone]

## 2011-06-18 NOTE — Discharge Instructions (Signed)

## 2011-08-30 ENCOUNTER — Encounter (HOSPITAL_COMMUNITY): Payer: Self-pay | Admitting: Emergency Medicine

## 2011-08-30 DIAGNOSIS — K7689 Other specified diseases of liver: Secondary | ICD-10-CM | POA: Insufficient documentation

## 2011-08-30 DIAGNOSIS — Z87891 Personal history of nicotine dependence: Secondary | ICD-10-CM | POA: Insufficient documentation

## 2011-08-30 DIAGNOSIS — I10 Essential (primary) hypertension: Secondary | ICD-10-CM | POA: Insufficient documentation

## 2011-08-30 DIAGNOSIS — K625 Hemorrhage of anus and rectum: Secondary | ICD-10-CM | POA: Insufficient documentation

## 2011-08-30 DIAGNOSIS — K649 Unspecified hemorrhoids: Secondary | ICD-10-CM | POA: Insufficient documentation

## 2011-08-30 DIAGNOSIS — K429 Umbilical hernia without obstruction or gangrene: Secondary | ICD-10-CM | POA: Insufficient documentation

## 2011-08-30 LAB — POCT I-STAT, CHEM 8
Calcium, Ion: 1.17 mmol/L (ref 1.12–1.23)
Glucose, Bld: 140 mg/dL — ABNORMAL HIGH (ref 70–99)
HCT: 47 % (ref 39.0–52.0)
Hemoglobin: 16 g/dL (ref 13.0–17.0)
Potassium: 3.6 mEq/L (ref 3.5–5.1)
TCO2: 23 mmol/L (ref 0–100)

## 2011-08-30 LAB — URINALYSIS, ROUTINE W REFLEX MICROSCOPIC
Glucose, UA: NEGATIVE mg/dL
Hgb urine dipstick: NEGATIVE
Nitrite: NEGATIVE
Protein, ur: NEGATIVE mg/dL
Urobilinogen, UA: 0.2 mg/dL (ref 0.0–1.0)
pH: 6 (ref 5.0–8.0)

## 2011-08-30 NOTE — ED Notes (Signed)
Pt c/o mid abd pain x's 3 days.  St's noticed bright red blood in stool yesterday.  Pt denies nausea or vomiting.  Skin warm and dry color appropriate

## 2011-08-31 ENCOUNTER — Emergency Department (HOSPITAL_COMMUNITY): Payer: Self-pay

## 2011-08-31 ENCOUNTER — Emergency Department (HOSPITAL_COMMUNITY)
Admission: EM | Admit: 2011-08-31 | Discharge: 2011-08-31 | Disposition: A | Discharge status: Home / Self Care | Payer: Self-pay | Attending: Emergency Medicine | Admitting: Emergency Medicine

## 2011-08-31 DIAGNOSIS — R109 Unspecified abdominal pain: Secondary | ICD-10-CM

## 2011-08-31 DIAGNOSIS — K625 Hemorrhage of anus and rectum: Secondary | ICD-10-CM

## 2011-08-31 DIAGNOSIS — K429 Umbilical hernia without obstruction or gangrene: Secondary | ICD-10-CM

## 2011-08-31 DIAGNOSIS — K649 Unspecified hemorrhoids: Secondary | ICD-10-CM

## 2011-08-31 LAB — CBC WITH DIFFERENTIAL/PLATELET
Eosinophils Relative: 2 % (ref 0–5)
Hemoglobin: 15.3 g/dL (ref 13.0–17.0)
Lymphocytes Relative: 35 % (ref 12–46)
Lymphs Abs: 2 10*3/uL (ref 0.7–4.0)
MCH: 32.3 pg (ref 26.0–34.0)
MCV: 92.4 fL (ref 78.0–100.0)
Monocytes Relative: 9 % (ref 3–12)
Platelets: 116 10*3/uL — ABNORMAL LOW (ref 150–400)
RBC: 4.73 MIL/uL (ref 4.22–5.81)
WBC: 5.7 10*3/uL (ref 4.0–10.5)

## 2011-08-31 MED ORDER — IOHEXOL 300 MG/ML  SOLN
20.0000 mL | INTRAMUSCULAR | Status: AC
  Administered 2011-08-31: 20 mL via ORAL

## 2011-08-31 MED ORDER — IOHEXOL 300 MG/ML  SOLN
100.0000 mL | Freq: Once | INTRAMUSCULAR | Status: AC | PRN
  Administered 2011-08-31: 100 mL via INTRAVENOUS

## 2011-08-31 MED ORDER — OXYCODONE-ACETAMINOPHEN 5-325 MG PO TABS
2.0000 | ORAL_TABLET | ORAL | Status: AC | PRN
Start: 2011-08-31 — End: 2011-09-10

## 2011-08-31 MED ORDER — MORPHINE SULFATE 4 MG/ML IJ SOLN
4.0000 mg | Freq: Once | INTRAMUSCULAR | Status: AC
  Administered 2011-08-31: 4 mg via INTRAVENOUS
  Filled 2011-08-31: qty 1

## 2011-08-31 MED ORDER — PRAMOXINE HCL 1 % RE FOAM: RECTAL | Status: AC | PRN

## 2011-08-31 MED ORDER — POLYETHYLENE GLYCOL 3350 17 G PO PACK: 17.0000 g | PACK | Freq: Every day | ORAL | Status: AC

## 2011-08-31 NOTE — ED Notes (Signed)
CT informed that pt finished contrast dye

## 2011-08-31 NOTE — ED Notes (Signed)
Pt c/o bright red blood in stool x2days. Pt states he sees blood in toilet and on paper. Pt states he also has pain from abdominal hernia since 2002, no new pain. NAD noted. Denies n/v.

## 2011-08-31 NOTE — ED Notes (Signed)
Pt dc home in NAD. PT voiced understanding of d/c instructions and follow up care. Instructed not to drive after taking percocet.

## 2011-09-01 NOTE — ED Provider Notes (Signed)
History     CSN: [id number]  Arrival date & time 08/30/11  2246   First MD Initiated Contact with Patient 08/31/11 0040      Chief Complaint  Patient presents with  . Abdominal Pain    (Consider location/radiation/quality/duration/timing/severity/associated sxs/prior treatment) HPI 61 year old male presents to emergency department complaining of diffuse abdominal pain, 3 days of bright red blood per rectum, more when wiping toilet paper. He denies any nausea or vomiting, no change in his stool habits. Patient reports remote history of hernia, never followed back up with the surgeon. 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 He denies previous history of hemorrhoids. No prior colonoscopy. No recent weight loss.  Past Medical History  Diagnosis Date  . Hypertension   . Hernia     Past Surgical History  Procedure Date  . Pelvic fracture surgery     No family history on file.  History  Substance Use Topics  . Smoking status: Former Games developer  . Smokeless tobacco: Not on file  . Alcohol Use: Yes     6 beers daily      Review of Systems  All other systems reviewed and are negative.    Allergies  Review of patient's allergies indicates no known allergies.  Home Medications   Current Outpatient Rx  Name Route Sig Dispense Refill  . OXYCODONE-ACETAMINOPHEN 5-325 MG PO TABS Oral Take 2 tablets by mouth every 4 (four) hours as needed for pain. 20 tablet 0  . POLYETHYLENE GLYCOL 3350 PO PACK Oral Take 17 g by mouth daily. 14 each 0  . PRAMOXINE HCL 1 % RE FOAM Rectal Place rectally every 2 (two) hours as needed for hemorrhoids. 15 g 0    BP 136/75  Pulse 66  Temp 97.9 F (36.6 C) (Oral)  Resp 14  SpO2 98%  Physical Exam  Nursing note and vitals reviewed. Constitutional: He is oriented to person, place, and time. He appears well-developed and well-nourished.  HENT:  Head: Normocephalic and atraumatic.  Nose: Nose normal.  Mouth/Throat:  Oropharynx is clear and moist.  Eyes: Conjunctivae and EOM are normal. Pupils are equal, round, and reactive to light.  Neck: Normal range of motion. Neck supple. No JVD present. No tracheal deviation present. No thyromegaly present.  Cardiovascular: Normal rate, regular rhythm, normal heart sounds and intact distal pulses.  Exam reveals no gallop and no friction rub.   No murmur heard. Pulmonary/Chest: Effort normal and breath sounds normal. No stridor. No respiratory distress. He has no wheezes. He has no rales. He exhibits no tenderness.  Abdominal: Soft. Bowel sounds are normal. He exhibits no distension and no mass. There is tenderness (tenderness throughout the abdomen more in around the umbilical area. Unable to palpate a hernia in this area). There is no rebound and no guarding.  Genitourinary:       Patient with several hemorrhoids, none actively bleeding thrombosed. There is a slightly inflamed one at 11:00 area. patient without inguinal hernias  Musculoskeletal: Normal range of motion. He exhibits no edema and no tenderness.  Lymphadenopathy:    He has no cervical adenopathy.  Neurological: He is alert and oriented to person, place, and time. He exhibits normal muscle tone. Coordination normal.  Skin: Skin is warm and dry. No rash noted. No erythema. No pallor.  Psychiatric: He has a normal mood and affect. His behavior is normal. Judgment and thought content normal.    ED Course  Procedures (including critical care time)  Labs Reviewed  CBC WITH DIFFERENTIAL - Abnormal; Notable for the following:    Platelets 116 (*)  PLATELET COUNT CONFIRMED BY SMEAR   All other components within normal limits  POCT I-STAT, CHEM 8 - Abnormal; Notable for the following:    Glucose, Bld 140 (*)     All other components within normal limits  URINALYSIS, ROUTINE W REFLEX MICROSCOPIC  OCCULT BLOOD, POC DEVICE  LAB REPORT - SCANNED   Ct Abdomen Pelvis W Contrast  08/31/2011  *RADIOLOGY REPORT*   Clinical Data: Abdominal pain.  Fever.  Rectal bleeding.  CT ABDOMEN AND PELVIS WITH CONTRAST  Technique:  Multidetector CT imaging of the abdomen and pelvis was performed following the standard protocol during bolus administration of intravenous contrast.  Contrast: 1 OMNIPAQUE IOHEXOL 300 MG/ML  SOLN, OMNIPAQUE IOHEXOL 300 MG/ML  SOLN  Comparison: 02/08/2010  Findings: Dependent changes in the lung bases.  Diffuse low attenuation change throughout the liver consistent with fatty infiltration.  Calcified granuloma in the spleen. Gallbladder, pancreas, adrenal glands, kidneys, and retroperitoneal lymph nodes are unremarkable.  Minimal calcification in the abdominal aorta without aneurysm.  The stomach, small bowel, and colon are not abnormally distended.  No apparent wall thickening. No free air or free fluid in the abdomen.  Mesenteric and portal vessels are patent.  Small supra umbilical hernia containing fat, stable since previous study.  Pelvis:  Prostate gland is not enlarged.  Bladder wall is not thickened.  No free or loculated pelvic fluid collections.  No evidence of diverticulitis.  The appendix is normal.  No significant pelvic lymphadenopathy.  Degenerative changes in the lower thoracic spine.  Spondylolysis and mild spondylolisthesis of L5 on the sacrum.  Screw fixation of the sacroiliac joints bilaterally.  Old bilateral inferior and superior pubic rami fractures.  IMPRESSION: Stable appearance of the examination since previous study.  Fatty infiltration of the liver.  Minimal fat containing supraumbilical hernia.  Old postoperative and post-traumatic changes in the pelvis and sacrum.  Spondylolysis and spondylolisthesis at L5-S1.  Original Report Authenticated By: Marlon Pel, M.D.     1. Umbilical hernia   2. Rectal bleeding   3. Hemorrhoids   4. Abdominal pain of unknown etiology       MDM  61 year old male with reported rectal bleeding most likely from hemorrhoids. We'll  refer patient to gastroenterology for colonoscopy as he is hemodynamically stable with normal H&H and no further bleeding. We'll also refer him back to the surgery clinic for his umbilical hernia.        Olivia Mackie, MD 09/01/11 [phone]

## 2011-12-11 ENCOUNTER — Emergency Department (HOSPITAL_COMMUNITY): Payer: Self-pay

## 2011-12-11 ENCOUNTER — Emergency Department (HOSPITAL_COMMUNITY)
Admission: EM | Admit: 2011-12-11 | Discharge: 2011-12-11 | Disposition: A | Discharge status: Home / Self Care | Payer: Self-pay | Attending: Emergency Medicine | Admitting: Emergency Medicine

## 2011-12-11 ENCOUNTER — Encounter (HOSPITAL_COMMUNITY): Payer: Self-pay | Admitting: *Deleted

## 2011-12-11 DIAGNOSIS — M546 Pain in thoracic spine: Secondary | ICD-10-CM | POA: Insufficient documentation

## 2011-12-11 DIAGNOSIS — Z79899 Other long term (current) drug therapy: Secondary | ICD-10-CM | POA: Insufficient documentation

## 2011-12-11 DIAGNOSIS — I10 Essential (primary) hypertension: Secondary | ICD-10-CM | POA: Insufficient documentation

## 2011-12-11 DIAGNOSIS — Z87891 Personal history of nicotine dependence: Secondary | ICD-10-CM | POA: Insufficient documentation

## 2011-12-11 DIAGNOSIS — J40 Bronchitis, not specified as acute or chronic: Secondary | ICD-10-CM | POA: Insufficient documentation

## 2011-12-11 DIAGNOSIS — M549 Dorsalgia, unspecified: Secondary | ICD-10-CM

## 2011-12-11 DIAGNOSIS — Z8719 Personal history of other diseases of the digestive system: Secondary | ICD-10-CM | POA: Insufficient documentation

## 2011-12-11 LAB — CBC WITH DIFFERENTIAL/PLATELET
Basophils Relative: 1 % (ref 0–1)
Eosinophils Relative: 2 % (ref 0–5)
HCT: 42.8 % (ref 39.0–52.0)
Hemoglobin: 14.6 g/dL (ref 13.0–17.0)
MCHC: 34.1 g/dL (ref 30.0–36.0)
MCV: 94.3 fL (ref 78.0–100.0)
Monocytes Absolute: 0.7 10*3/uL (ref 0.1–1.0)
Monocytes Relative: 11 % (ref 3–12)
Neutro Abs: 3 10*3/uL (ref 1.7–7.7)
RDW: 14.1 % (ref 11.5–15.5)

## 2011-12-11 LAB — POCT I-STAT, CHEM 8
BUN: 7 mg/dL (ref 6–23)
Calcium, Ion: 1.17 mmol/L (ref 1.12–1.23)
Chloride: 106 mEq/L (ref 96–112)
Glucose, Bld: 103 mg/dL — ABNORMAL HIGH (ref 70–99)
HCT: 45 % (ref 39.0–52.0)
TCO2: 22 mmol/L (ref 0–100)

## 2011-12-11 MED ORDER — OXYCODONE-ACETAMINOPHEN 5-325 MG PO TABS: 1.0000 | ORAL_TABLET | ORAL | Status: DC | PRN

## 2011-12-11 MED ORDER — AZITHROMYCIN 250 MG PO TABS: 250.0000 mg | ORAL_TABLET | Freq: Every day | ORAL | Status: DC

## 2011-12-11 MED ORDER — NITROGLYCERIN 0.4 MG SL SUBL
0.4000 mg | SUBLINGUAL_TABLET | SUBLINGUAL | Status: DC | PRN
Start: 2011-12-11 — End: 2011-12-11
  Administered 2011-12-11: 0.4 mg via SUBLINGUAL

## 2011-12-11 MED ORDER — ASPIRIN 81 MG PO CHEW
324.0000 mg | CHEWABLE_TABLET | Freq: Once | ORAL | Status: AC
  Administered 2011-12-11: 324 mg via ORAL
  Filled 2011-12-11: qty 4

## 2011-12-11 MED ORDER — OXYCODONE-ACETAMINOPHEN 5-325 MG PO TABS
2.0000 | ORAL_TABLET | Freq: Once | ORAL | Status: AC
  Administered 2011-12-11: 2 via ORAL
  Filled 2011-12-11: qty 2

## 2011-12-11 MED ORDER — NAPROXEN 500 MG PO TABS: 500.0000 mg | ORAL_TABLET | Freq: Two times a day (BID) | ORAL | Status: DC

## 2011-12-11 NOTE — ED Notes (Signed)
Pt to ED c/o sternal chest pain and back pain for several weeks that increased the last few days.  Pt explains pain as "quick shocks".  Pt with non-productive cough for several days and night sweats.  Pt was tx for TB when incarcerated and states he completed the entire tx.

## 2011-12-11 NOTE — ED Notes (Addendum)
C/o CP & back pain, onset ~ 2000, (denies: sx other than pain), (denies: dizziness, sob, injury, nvd, fever, cough, congestion, cold sx or other sx), no agggravating or aleviating factors, climbs trees for living (tree service). Took ASA 81mg  pta. EKG reviewed in triage.

## 2011-12-11 NOTE — ED Notes (Signed)
Patient transported to X-ray 

## 2011-12-11 NOTE — ED Provider Notes (Signed)
History     CSN: [id number]  Arrival date & time 12/11/11  0023   First MD Initiated Contact with Patient 12/11/11 0051      Chief Complaint  Patient presents with  . Chest Pain  . 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 The symptoms started approximately 4 days ago, mostly the upper back. 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 He also denies to having night sweats. He states that he has intermittent chest pains over the last several days which last for 2-3 seconds, are sharp and shooting and often wake him up at night. He does not have exertional symptoms. He denies fevers or chills but he does have night sweats. There is been no swelling in the legs. 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 He denies having active tuberculosis in the past.  Patient is a 61 y.o. male presenting with chest pain and back pain. The history is provided by the patient.  Chest Pain    Back Pain  Associated symptoms include chest pain.    Past Medical History  Diagnosis Date  . Hypertension   . Hernia     Past Surgical History  Procedure Date  . Pelvic fracture surgery     No family history on file.  History  Substance Use Topics  . Smoking status: Former Games developer  . Smokeless tobacco: Not on file  . Alcohol Use: Yes     Comment: 6 beers daily      Review of Systems  Cardiovascular: Positive for chest pain.  Musculoskeletal: Positive for back pain.  All other systems reviewed and are negative.    Allergies    Review of patient's allergies indicates no known allergies.  Home Medications   Current Outpatient Rx  Name  Route  Sig  Dispense  Refill  . AZITHROMYCIN 250 MG PO TABS   Oral   Take 1 tablet (250 mg total) by mouth daily. 500mg  PO day 1, then 250mg  PO days 205   6 tablet   0   . NAPROXEN 500 MG PO TABS   Oral   Take 1 tablet (500 mg total) by mouth 2 (two) times daily with a meal.   30 tablet   0   . OXYCODONE-ACETAMINOPHEN 5-325 MG PO TABS   Oral   Take 1 tablet by mouth every 4 (four) hours as needed for pain.   20 tablet   0     BP 146/100  Pulse 77  Temp 97.4 F (36.3 C) (Oral)  Resp 16  SpO2 99%  Physical Exam  Nursing note and vitals reviewed. Constitutional: He appears well-developed and well-nourished. No distress.  HENT:  Head: Normocephalic and atraumatic.  Mouth/Throat: Oropharynx is clear and moist. No oropharyngeal exudate.  Eyes: Conjunctivae normal and EOM are normal. Pupils are equal, round, and reactive to light. Right eye exhibits no discharge. Left eye exhibits no discharge. No scleral icterus.  Neck: Normal range of motion. Neck supple. No JVD present. No thyromegaly present.  Cardiovascular: Normal rate, regular rhythm, normal heart sounds and intact distal pulses.  Exam reveals no gallop and no friction rub.   No murmur heard.      Strong pulses at the radial arteries bilaterally  Pulmonary/Chest: Effort normal and breath sounds normal. No respiratory distress. He has no wheezes. He has no rales.       Lungs are clear, no respiratory distress  Abdominal: Soft. Bowel sounds are normal. He exhibits no distension and no mass. There is no tenderness.  Musculoskeletal: Normal range of motion. He exhibits tenderness ( Reproducible tenderness to palpation of the mid upper right back, no spinal tenderness to palpation). He exhibits no edema.  Lymphadenopathy:    He has no cervical adenopathy.  Neurological: He is alert. Coordination normal.  Skin:  Skin is warm and dry. No rash noted. No erythema.  Psychiatric: He has a normal mood and affect. His behavior is normal.    ED Course  Procedures (including critical care time)  Labs Reviewed  CBC WITH DIFFERENTIAL - Abnormal; Notable for the following:    Platelets 139 (*)     All other components within normal limits  POCT I-STAT, CHEM 8 - Abnormal; Notable for the following:    Glucose, Bld 103 (*)     All other components within normal limits  POCT I-STAT TROPONIN I   Dg Chest 2 View  12/11/2011  *RADIOLOGY REPORT*  Clinical Data: Chest pain  CHEST - 2 VIEW  Comparison: 01/25/2011  Findings: Heart size within normal limits.  Mild aortic tortuosity, similar to prior.  Lungs remain predominately clear.  No pleural effusion or pneumothorax.  No acute osseous finding.  IMPRESSION: No radiographic evidence of acute cardiopulmonary process.   Original Report Authenticated By: Jearld Lesch, M.D.      1. Back pain   2. Bronchitis       MDM  The patient's normal EKG, his symptoms are concerning for possible infectious process would consider active tuberculosis versus pneumonia. Would also consider musculoskeletal strain of his upper back. We'll obtain a troponin, lab work, chest x-ray. He is in no distress and has normal vital signs other than mild hypertension at this time.  ED ECG REPORT  I personally interpreted this EKG   Date: 12/11/2011   Rate: 73  Rhythm: normal sinus rhythm  QRS Axis: normal  Intervals: normal  ST/T Wave abnormalities: normal  Conduction Disutrbances:none  Narrative Interpretation:   Old EKG Reviewed: none available   Care discussed with Dr. Ninetta Lights of infectious disease who agrees that the patient's symptoms are unlikely to be related to tuberculosis. He has a normal chest x-ray today, normal vital signs, normal lab work and has undergone a complete treatment of his inactive tuberculosis while he was in prison in the past. At this time the patient  is minimally symptomatic and has had significant improvement with Percocet for his back pain. He appears stable for discharge on Zithromax, Percocet, Naprosyn. ID clinic followup given      Vida Roller, MD 12/11/11 [phone]

## 2012-02-18 ENCOUNTER — Emergency Department (HOSPITAL_COMMUNITY): Payer: Self-pay

## 2012-02-18 ENCOUNTER — Emergency Department (HOSPITAL_COMMUNITY)
Admission: EM | Admit: 2012-02-18 | Discharge: 2012-02-18 | Disposition: A | Discharge status: Home / Self Care | Payer: Self-pay | Attending: Emergency Medicine | Admitting: Emergency Medicine

## 2012-02-18 ENCOUNTER — Encounter (HOSPITAL_COMMUNITY): Payer: Self-pay | Admitting: Emergency Medicine

## 2012-02-18 DIAGNOSIS — M25551 Pain in right hip: Secondary | ICD-10-CM

## 2012-02-18 DIAGNOSIS — S79919A Unspecified injury of unspecified hip, initial encounter: Secondary | ICD-10-CM | POA: Insufficient documentation

## 2012-02-18 DIAGNOSIS — Y929 Unspecified place or not applicable: Secondary | ICD-10-CM | POA: Insufficient documentation

## 2012-02-18 DIAGNOSIS — Z87891 Personal history of nicotine dependence: Secondary | ICD-10-CM | POA: Insufficient documentation

## 2012-02-18 DIAGNOSIS — Z8719 Personal history of other diseases of the digestive system: Secondary | ICD-10-CM | POA: Insufficient documentation

## 2012-02-18 DIAGNOSIS — W11XXXA Fall on and from ladder, initial encounter: Secondary | ICD-10-CM | POA: Insufficient documentation

## 2012-02-18 DIAGNOSIS — Y9389 Activity, other specified: Secondary | ICD-10-CM | POA: Insufficient documentation

## 2012-02-18 DIAGNOSIS — I10 Essential (primary) hypertension: Secondary | ICD-10-CM | POA: Insufficient documentation

## 2012-02-18 MED ORDER — OXYCODONE-ACETAMINOPHEN 5-325 MG PO TABS
1.0000 | ORAL_TABLET | Freq: Once | ORAL | Status: AC
  Administered 2012-02-18: 1 via ORAL
  Filled 2012-02-18: qty 1

## 2012-02-18 MED ORDER — OXYCODONE-ACETAMINOPHEN 5-325 MG PO TABS: ORAL_TABLET | ORAL | Status: DC

## 2012-02-18 MED ORDER — IBUPROFEN 800 MG PO TABS
800.0000 mg | ORAL_TABLET | Freq: Once | ORAL | Status: AC
  Administered 2012-02-18: 800 mg via ORAL
  Filled 2012-02-18: qty 1

## 2012-02-18 NOTE — ED Notes (Signed)
Pt c/o severe pain in right hip. Pt was on ladder but tied into the tree. A branch he cut knocked the ladder out from under him and he swung around and hit tree with his right hip.

## 2012-02-18 NOTE — ED Provider Notes (Signed)
History     CSN: [id number]  Arrival date & time 02/18/12  2109   First MD Initiated Contact with Patient 02/18/12 2151      Chief Complaint  Patient presents with  . Fall    (Consider location/radiation/quality/duration/timing/severity/associated sxs/prior treatment) HPI  [name] is a 62 y.o. male complaining of  Right hip pain x24 hours s/p fall off ladder at approximately 10 feet yesterday. Patient denies any head trauma, loss of consciousness, nausea vomiting, chest pain, shortness of breath, abdominal pain, hematuria, difficulty ambulating, numbness or paresthesia. Patient has prior trauma to his pelvis. Patient is able to ambulate with pain. He rates his pain is 10 out of 10. Has not taken any pain medications.   Past Medical History  Diagnosis Date  . Hypertension   . Hernia     Past Surgical History  Procedure Date  . Pelvic fracture surgery     No family history on file.  History  Substance Use Topics  . Smoking status: Former Games developer  . Smokeless tobacco: Not on file  . Alcohol Use: Yes     Comment: 6 beers daily      Review of Systems  Constitutional: Negative for fever.  Respiratory: Negative for shortness of breath.   Cardiovascular: Negative for chest pain.  Gastrointestinal: Negative for nausea, vomiting, abdominal pain and diarrhea.  All other systems reviewed and are negative.    Allergies  Review of patient's allergies indicates no known allergies.  Home Medications  No current outpatient prescriptions on file.  BP 193/102  Pulse 69  Temp 96.8 F (36 C) (Oral)  Resp 14  SpO2 97%  Physical Exam  Nursing note and vitals reviewed. Constitutional: He is oriented to person, place, and time. He appears well-developed and well-nourished. No distress.  HENT:  Head: Normocephalic and atraumatic.  Mouth/Throat: Oropharynx is clear and moist.  Eyes: Conjunctivae normal and EOM are normal. Pupils are equal, round, and reactive to light.    Neck: Normal range of motion. Neck supple.  Cardiovascular: Normal rate, regular rhythm and intact distal pulses.   Pulmonary/Chest: Effort normal and breath sounds normal. No stridor. No respiratory distress. He has no wheezes. He has no rales. He exhibits no tenderness.  Abdominal: Soft. Bowel sounds are normal. He exhibits no distension and no mass. There is no tenderness. There is no rebound and no guarding.  Musculoskeletal: Normal range of motion.       No swelling, ecchymoses or abrasions. Mild tenderness to palpation of the right hip. Full active range of motion with mild pain. Patient ambulate with mildly antalgic gait. Dorsalis pedis is 2+ bilaterally distal sensation is grossly intact.  Neurological: He is alert and oriented to person, place, and time.  Psychiatric: He has a normal mood and affect.    ED Course  Procedures (including critical care time)  Labs Reviewed - No data to display Dg Hip Complete Right  02/18/2012  *RADIOLOGY REPORT*  Clinical Data: Tree limb injury to the right hip.  Right hip pain.  RIGHT HIP - COMPLETE 2+ VIEW  Comparison: 06/18/2011  Findings: Stable lag screws node along the sacroiliac joints. Evidence of prior saddle fracture of the pubic rami.  Mild spurring of the lesser trochanter is chronic.  No right hip fracture or acute bony findings.  IMPRESSION:  1.  No acute bony findings. 2.  Stable lag screw fixation of the sacroiliac joints.  Old saddle fracture of the pubic rami.   Original Report Authenticated By:  Gaylyn Rong, M.D.      1. Right hip pain       MDM  X-ray shows no bony abnormalities. Patient is able to ambulate. Vital signs are stable and he is amenable to discharge at this time with pain control   Pt verbalized understanding and agrees with care plan. Outpatient follow-up and return precautions given.    New Prescriptions   OXYCODONE-ACETAMINOPHEN (PERCOCET/ROXICET) 5-325 MG PER TABLET    1 to 2 tabs PO q6hrs  PRN for pain          Wynetta Emery, PA-C 02/19/12 0021

## 2012-02-18 NOTE — ED Notes (Signed)
PT. FELL FROM  A LADDER YESTERDAY , REPORTS PAIN AT RIGHT HIP , NO LOC, AMBULATORY.

## 2012-02-19 NOTE — ED Provider Notes (Signed)
Medical screening examination/treatment/procedure(s) were performed by non-physician practitioner and as supervising physician I was immediately available for consultation/collaboration.   Naketa Daddario L Miko Markwood, MD 02/19/12 1239 

## 2012-02-26 ENCOUNTER — Encounter (HOSPITAL_COMMUNITY): Payer: Self-pay | Admitting: Emergency Medicine

## 2012-02-26 ENCOUNTER — Emergency Department (HOSPITAL_COMMUNITY): Payer: Self-pay

## 2012-02-26 ENCOUNTER — Emergency Department (HOSPITAL_COMMUNITY)
Admission: EM | Admit: 2012-02-26 | Discharge: 2012-02-27 | Disposition: A | Discharge status: Home / Self Care | Payer: Self-pay | Attending: Emergency Medicine | Admitting: Emergency Medicine

## 2012-02-26 DIAGNOSIS — Z23 Encounter for immunization: Secondary | ICD-10-CM | POA: Insufficient documentation

## 2012-02-26 DIAGNOSIS — Y929 Unspecified place or not applicable: Secondary | ICD-10-CM | POA: Insufficient documentation

## 2012-02-26 DIAGNOSIS — Z87891 Personal history of nicotine dependence: Secondary | ICD-10-CM | POA: Insufficient documentation

## 2012-02-26 DIAGNOSIS — Z8719 Personal history of other diseases of the digestive system: Secondary | ICD-10-CM | POA: Insufficient documentation

## 2012-02-26 DIAGNOSIS — S8010XA Contusion of unspecified lower leg, initial encounter: Secondary | ICD-10-CM | POA: Insufficient documentation

## 2012-02-26 DIAGNOSIS — S8012XA Contusion of left lower leg, initial encounter: Secondary | ICD-10-CM

## 2012-02-26 DIAGNOSIS — IMO0002 Reserved for concepts with insufficient information to code with codable children: Secondary | ICD-10-CM | POA: Insufficient documentation

## 2012-02-26 DIAGNOSIS — S80812A Abrasion, left lower leg, initial encounter: Secondary | ICD-10-CM

## 2012-02-26 DIAGNOSIS — I10 Essential (primary) hypertension: Secondary | ICD-10-CM | POA: Insufficient documentation

## 2012-02-26 DIAGNOSIS — Y9389 Activity, other specified: Secondary | ICD-10-CM | POA: Insufficient documentation

## 2012-02-26 MED ORDER — HYDROCODONE-ACETAMINOPHEN 5-325 MG PO TABS
1.0000 | ORAL_TABLET | Freq: Once | ORAL | Status: AC
  Administered 2012-02-26: 1 via ORAL
  Filled 2012-02-26: qty 1

## 2012-02-26 MED ORDER — HYDROCODONE-ACETAMINOPHEN 5-325 MG PO TABS: 1.0000 | ORAL_TABLET | Freq: Four times a day (QID) | ORAL | Status: DC | PRN

## 2012-02-26 MED ORDER — TETANUS-DIPHTH-ACELL PERTUSSIS 5-2.5-18.5 LF-MCG/0.5 IM SUSP
0.5000 mL | Freq: Once | INTRAMUSCULAR | Status: AC
  Administered 2012-02-26: 0.5 mL via INTRAMUSCULAR
  Filled 2012-02-26: qty 0.5
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
## 2012-03-09 ENCOUNTER — Emergency Department (HOSPITAL_COMMUNITY)
Admission: EM | Admit: 2012-03-09 | Discharge: 2012-03-09 | Disposition: A | Discharge status: Home / Self Care | Payer: Self-pay | Attending: Emergency Medicine | Admitting: Emergency Medicine

## 2012-03-09 ENCOUNTER — Encounter (HOSPITAL_COMMUNITY): Payer: Self-pay

## 2012-03-09 DIAGNOSIS — R109 Unspecified abdominal pain: Secondary | ICD-10-CM | POA: Insufficient documentation

## 2012-03-09 DIAGNOSIS — I10 Essential (primary) hypertension: Secondary | ICD-10-CM | POA: Insufficient documentation

## 2012-03-09 DIAGNOSIS — Z8719 Personal history of other diseases of the digestive system: Secondary | ICD-10-CM | POA: Insufficient documentation

## 2012-03-09 DIAGNOSIS — Z87891 Personal history of nicotine dependence: Secondary | ICD-10-CM | POA: Insufficient documentation

## 2012-03-09 DIAGNOSIS — G8929 Other chronic pain: Secondary | ICD-10-CM | POA: Insufficient documentation

## 2012-03-09 DIAGNOSIS — R61 Generalized hyperhidrosis: Secondary | ICD-10-CM | POA: Insufficient documentation

## 2012-03-09 DIAGNOSIS — Z8781 Personal history of (healed) traumatic fracture: Secondary | ICD-10-CM | POA: Insufficient documentation

## 2012-03-09 LAB — CBC WITH DIFFERENTIAL/PLATELET
Eosinophils Absolute: 0.1 10*3/uL (ref 0.0–0.7)
Eosinophils Relative: 2 % (ref 0–5)
Hemoglobin: 15.5 g/dL (ref 13.0–17.0)
Lymphs Abs: 1.9 10*3/uL (ref 0.7–4.0)
MCH: 32.4 pg (ref 26.0–34.0)
MCHC: 35.6 g/dL (ref 30.0–36.0)
MCV: 90.8 fL (ref 78.0–100.0)
Monocytes Relative: 6 % (ref 3–12)
RBC: 4.79 MIL/uL (ref 4.22–5.81)

## 2012-03-09 LAB — BASIC METABOLIC PANEL
BUN: 6 mg/dL (ref 6–23)
Calcium: 9.5 mg/dL (ref 8.4–10.5)
Creatinine, Ser: 0.72 mg/dL (ref 0.50–1.35)
GFR calc non Af Amer: >90 mL/min (ref >90)
Glucose, Bld: 120 mg/dL — ABNORMAL HIGH (ref 70–99)

## 2012-03-09 MED ORDER — OXYCODONE-ACETAMINOPHEN 5-325 MG PO TABS: 1.0000 | ORAL_TABLET | ORAL | Status: DC | PRN

## 2012-03-09 MED ORDER — OXYCODONE-ACETAMINOPHEN 5-325 MG PO TABS
2.0000 | ORAL_TABLET | Freq: Once | ORAL | Status: AC
  Administered 2012-03-09: 2 via ORAL
  Filled 2012-03-09: qty 2
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
## 2012-03-09 NOTE — ED Notes (Signed)
Pt reports increase pelvic pain and night sweats. Pt states fractured his pelvic in 1998 and having increased pain x2 days and night sweats x2 months

## 2012-03-09 NOTE — ED Notes (Signed)
Patient is alert and orientedx4.  Patient was explained discharge instructions and he understood them.  Patient's neighbor, Albertha Ghee is taking the patient home.
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
## 2012-03-10 NOTE — ED Provider Notes (Signed)
Medical screening examination/treatment/procedure(s) were performed by non-physician practitioner and as supervising physician I was immediately available for consultation/collaboration.   Jake Goodson Y. Evalynn Hankins, MD 03/10/12 1500 

## 2012-05-02 ENCOUNTER — Emergency Department (HOSPITAL_COMMUNITY)
Admission: EM | Admit: 2012-05-02 | Discharge: 2012-05-02 | Disposition: A | Discharge status: Home / Self Care | Payer: Self-pay | Attending: Emergency Medicine | Admitting: Emergency Medicine

## 2012-05-02 ENCOUNTER — Emergency Department (HOSPITAL_COMMUNITY): Payer: Self-pay

## 2012-05-02 ENCOUNTER — Encounter (HOSPITAL_COMMUNITY): Payer: Self-pay | Admitting: Emergency Medicine

## 2012-05-02 DIAGNOSIS — S0003XA Contusion of scalp, initial encounter: Secondary | ICD-10-CM | POA: Insufficient documentation

## 2012-05-02 DIAGNOSIS — Z8719 Personal history of other diseases of the digestive system: Secondary | ICD-10-CM | POA: Insufficient documentation

## 2012-05-02 DIAGNOSIS — Z87891 Personal history of nicotine dependence: Secondary | ICD-10-CM | POA: Insufficient documentation

## 2012-05-02 DIAGNOSIS — S0083XA Contusion of other part of head, initial encounter: Secondary | ICD-10-CM

## 2012-05-02 DIAGNOSIS — W208XXA Other cause of strike by thrown, projected or falling object, initial encounter: Secondary | ICD-10-CM | POA: Insufficient documentation

## 2012-05-02 DIAGNOSIS — I10 Essential (primary) hypertension: Secondary | ICD-10-CM | POA: Insufficient documentation

## 2012-05-02 DIAGNOSIS — Y92009 Unspecified place in unspecified non-institutional (private) residence as the place of occurrence of the external cause: Secondary | ICD-10-CM | POA: Insufficient documentation

## 2012-05-02 DIAGNOSIS — Y939 Activity, unspecified: Secondary | ICD-10-CM | POA: Insufficient documentation

## 2012-05-02 MED ORDER — TRAMADOL HCL 50 MG PO TABS: 50.0000 mg | ORAL_TABLET | Freq: Four times a day (QID) | ORAL | Status: DC | PRN

## 2012-05-02 MED ORDER — HYDROCODONE-ACETAMINOPHEN 5-325 MG PO TABS
1.0000 | ORAL_TABLET | Freq: Once | ORAL | Status: AC
  Administered 2012-05-02: 1 via ORAL
  Filled 2012-05-02: qty 1
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
## 2012-05-02 NOTE — ED Notes (Signed)
Pt states understanding of discharge instructions 

## 2012-11-15 ENCOUNTER — Encounter (HOSPITAL_COMMUNITY): Payer: Self-pay | Admitting: Emergency Medicine

## 2012-11-15 ENCOUNTER — Emergency Department (HOSPITAL_COMMUNITY)
Admission: EM | Admit: 2012-11-15 | Discharge: 2012-11-16 | Disposition: A | Discharge status: Home / Self Care | Payer: Self-pay | Attending: Emergency Medicine | Admitting: Emergency Medicine

## 2012-11-15 DIAGNOSIS — I10 Essential (primary) hypertension: Secondary | ICD-10-CM | POA: Insufficient documentation

## 2012-11-15 DIAGNOSIS — Z8719 Personal history of other diseases of the digestive system: Secondary | ICD-10-CM | POA: Insufficient documentation

## 2012-11-15 DIAGNOSIS — Z87891 Personal history of nicotine dependence: Secondary | ICD-10-CM | POA: Insufficient documentation

## 2012-11-15 DIAGNOSIS — H5789 Other specified disorders of eye and adnexa: Secondary | ICD-10-CM | POA: Insufficient documentation

## 2012-11-15 DIAGNOSIS — H5710 Ocular pain, unspecified eye: Secondary | ICD-10-CM | POA: Insufficient documentation

## 2012-11-15 DIAGNOSIS — H5711 Ocular pain, right eye: Secondary | ICD-10-CM

## 2012-11-15 MED ORDER — TETRACAINE HCL 0.5 % OP SOLN
2.0000 [drp] | Freq: Once | OPHTHALMIC | Status: AC
  Administered 2012-11-15: 2 [drp] via OPHTHALMIC
  Filled 2012-11-15: qty 2

## 2012-11-15 MED ORDER — OXYCODONE-ACETAMINOPHEN 5-325 MG PO TABS: 1.0000 | ORAL_TABLET | Freq: Four times a day (QID) | ORAL | Status: DC | PRN

## 2012-11-15 MED ORDER — FLUORESCEIN SODIUM 1 MG OP STRP
1.0000 | ORAL_STRIP | Freq: Once | OPHTHALMIC | Status: AC
  Administered 2012-11-15: 1 via OPHTHALMIC
  Filled 2012-11-15: qty 1

## 2012-11-15 MED ORDER — TOBRAMYCIN 0.3 % OP SOLN: 1.0000 [drp] | OPHTHALMIC | Status: DC

## 2012-11-15 NOTE — ED Provider Notes (Signed)
CSN: [id number]     Arrival date & time 11/15/12  2219 History   None    Chief Complaint  Patient presents with  . Eye Pain   HPI  History provided by the patient. Patient is a 62 year old male presents with complaints of right eye pain irritation. Patient states he believes he had a piece of wood go into his right eye after cutting a tree with chain saw yesterday.  Patient did rinse out his eye after the incident. Since then he continues to have irritation and some redness to the right eye. Pain is mild to moderate. He denies any vision change. There is occasional tearing of the eye. No other drainage. No swelling of the eyelids. Denies any photophobia.      Past Medical History  Diagnosis Date  . Hypertension   . Hernia    Past Surgical History  Procedure Laterality Date  . Pelvic fracture surgery     No family history on file. History  Substance Use Topics  . Smoking status: Former Games developer  . Smokeless tobacco: Not on file  . Alcohol Use: Yes     Comment: 6 beers daily    Review of Systems  Eyes: Positive for pain, discharge and redness. Negative for photophobia, itching and visual disturbance.  All other systems reviewed and are negative.    Allergies  Review of patient's allergies indicates no known allergies.  Home Medications   Current Outpatient Rx  Name  Route  Sig  Dispense  Refill  . traMADol (ULTRAM) 50 MG tablet   Oral   Take 1 tablet (50 mg total) by mouth every 6 (six) hours as needed for pain.   15 tablet   0    BP 146/105  Pulse 70  Temp(Src) 97.7 F (36.5 C) (Oral)  Resp 20  Wt 190 lb 3 oz (86.268 kg)  SpO2 97% Physical Exam  Nursing note and vitals reviewed. Constitutional: He is oriented to person, place, and time. He appears well-developed and well-nourished. No distress.  HENT:  Head: Normocephalic.  Eyes: EOM are normal. Pupils are equal, round, and reactive to light. Lids are everted and swept, no foreign bodies found. Right  conjunctiva is injected. Left conjunctiva is injected.  Slit lamp exam:      The right eye shows no corneal abrasion, no corneal flare, no corneal ulcer, no foreign body, no hyphema, no hypopyon and no fluorescein uptake.  Left eye pressure 12.  Cardiovascular: Normal rate and regular rhythm.   Pulmonary/Chest: Effort normal and breath sounds normal. No respiratory distress. He has no wheezes. He has no rales.  Musculoskeletal: Normal range of motion.  Neurological: He is alert and oriented to person, place, and time.  Skin: Skin is warm.  Psychiatric: He has a normal mood and affect. His behavior is normal.    ED Course  Procedures   Patient appears well. Vision unchanged. No signs of corneal abrasion. There is some redness and irritation to both eyes. No foreign bodies under the eyelids. Normal ocular pressure.   MDM   1. Eye pain, right         Angus Seller, PA-C 11/16/12 0222

## 2012-11-15 NOTE — ED Notes (Addendum)
While cutting trees, a chip of wood got in his rt. Eye. Denies blurred vision. C/o chronic pelvis - aggravating - taking tylenol with min. Relief. Pelvic pain unrelated to chief complaint.

## 2012-11-16 NOTE — ED Provider Notes (Signed)
Medical screening examination/treatment/procedure(s) were performed by non-physician practitioner and as supervising physician I was immediately available for consultation/collaboration.    Asalee Barrette, MD 11/16/12 0602 

## 2012-12-13 ENCOUNTER — Encounter (HOSPITAL_COMMUNITY): Payer: Self-pay | Admitting: Emergency Medicine

## 2012-12-13 ENCOUNTER — Emergency Department (HOSPITAL_COMMUNITY)
Admission: EM | Admit: 2012-12-13 | Discharge: 2012-12-13 | Disposition: A | Discharge status: Home / Self Care | Payer: Self-pay | Attending: Emergency Medicine | Admitting: Emergency Medicine

## 2012-12-13 DIAGNOSIS — S335XXA Sprain of ligaments of lumbar spine, initial encounter: Secondary | ICD-10-CM | POA: Insufficient documentation

## 2012-12-13 DIAGNOSIS — M549 Dorsalgia, unspecified: Secondary | ICD-10-CM

## 2012-12-13 DIAGNOSIS — I10 Essential (primary) hypertension: Secondary | ICD-10-CM | POA: Insufficient documentation

## 2012-12-13 DIAGNOSIS — Z8719 Personal history of other diseases of the digestive system: Secondary | ICD-10-CM | POA: Insufficient documentation

## 2012-12-13 DIAGNOSIS — Y929 Unspecified place or not applicable: Secondary | ICD-10-CM | POA: Insufficient documentation

## 2012-12-13 DIAGNOSIS — Y939 Activity, unspecified: Secondary | ICD-10-CM | POA: Insufficient documentation

## 2012-12-13 DIAGNOSIS — S39012A Strain of muscle, fascia and tendon of lower back, initial encounter: Secondary | ICD-10-CM

## 2012-12-13 DIAGNOSIS — Y990 Civilian activity done for income or pay: Secondary | ICD-10-CM | POA: Insufficient documentation

## 2012-12-13 DIAGNOSIS — X500XXA Overexertion from strenuous movement or load, initial encounter: Secondary | ICD-10-CM | POA: Insufficient documentation

## 2012-12-13 DIAGNOSIS — Z87891 Personal history of nicotine dependence: Secondary | ICD-10-CM | POA: Insufficient documentation

## 2012-12-13 MED ORDER — IBUPROFEN 800 MG PO TABS: 800.0000 mg | ORAL_TABLET | Freq: Three times a day (TID) | ORAL | Status: DC

## 2012-12-13 MED ORDER — DIAZEPAM 5 MG PO TABS: 5.0000 mg | ORAL_TABLET | Freq: Two times a day (BID) | ORAL | Status: DC

## 2012-12-13 MED ORDER — DIAZEPAM 5 MG PO TABS
5.0000 mg | ORAL_TABLET | Freq: Once | ORAL | Status: AC
  Administered 2012-12-13: 5 mg via ORAL
  Filled 2012-12-13: qty 1

## 2012-12-13 MED ORDER — KETOROLAC TROMETHAMINE 60 MG/2ML IM SOLN
60.0000 mg | Freq: Once | INTRAMUSCULAR | Status: AC
  Administered 2012-12-13: 60 mg via INTRAMUSCULAR
  Filled 2012-12-13: qty 2

## 2012-12-13 NOTE — ED Notes (Signed)
While at work, pt. Twisted lower back. Left sided back pain.  Sharp.

## 2012-12-13 NOTE — ED Provider Notes (Signed)
Medical screening examination/treatment/procedure(s) were performed by non-physician practitioner and as supervising physician I was immediately available for consultation/collaboration.  EKG Interpretation   None         Mylan B. Amalee Olsen, MD 12/13/12 2355 

## 2012-12-13 NOTE — ED Provider Notes (Signed)
CSN: [id number]     Arrival date & time 12/13/12  1805 History  This chart was scribed for non-physician practitioner Darren Gourd, PA-C, working with [name]. Darren Mayers, MD by [name], ED Scribe. This patient was seen in room TR05C/TR05C and the patient's care was started at 8:35 PM.    Chief Complaint  Patient presents with  . Back Pain   The history is provided by the patient. No language interpreter was used.   HPI Comments: [name] is a 62 y.o. male who presents to the Emergency Department complaining of a constant pain to the left side of the lower back, 10/10 currently, onset PTA when he states that he twisted the lower back while at work. He reports that the pain is exacerbated with rotation of the back. He denies any pain radiation down the legs, numbness, paresthesias, bowel or bladder incontinence. Patient denies taking any medications at home to treat his symptoms. Patient reports a history of HTN.   Past Medical History  Diagnosis Date  . Hypertension   . Hernia    Past Surgical History  Procedure Laterality Date  . Pelvic fracture surgery     History reviewed. No pertinent family history. History  Substance Use Topics  . Smoking status: Former Games developer  . Smokeless tobacco: Not on file  . Alcohol Use: Yes     Comment: 6 beers daily    Review of Systems  A complete 10 system review of systems was obtained and all systems are negative except as noted in the HPI and PMH.   Allergies  Review of patient's allergies indicates no known allergies.  Home Medications   Current Outpatient Rx  Name  Route  Sig  Dispense  Refill  . oxyCODONE-acetaminophen (PERCOCET/ROXICET) 5-325 MG per tablet   Oral   Take 1 tablet by mouth every 6 (six) hours as needed for pain.   10 tablet   0   . tobramycin (TOBREX) 0.3 % ophthalmic solution   Right Eye   Place 1 drop into the right eye every 4 (four) hours.   5 mL   0    Triage Vitals: BP 162/107  Pulse 72   Temp(Src) 98.2 F (36.8 C) (Oral)  Resp 18  SpO2 100%  Physical Exam  Nursing note and vitals reviewed. Constitutional: He is oriented to person, place, and time. He appears well-developed and well-nourished. No distress.  HENT:  Head: Normocephalic and atraumatic.  Eyes: Conjunctivae and EOM are normal.  Neck: Normal range of motion. Neck supple.  Cardiovascular: Normal rate, regular rhythm and normal heart sounds.   Pulmonary/Chest: Effort normal and breath sounds normal.  Musculoskeletal: Normal range of motion. He exhibits no edema.  Tenderness to palpation to the left paralumbar muscles with spasm. No spinous tenderness.   Neurological: He is alert and oriented to person, place, and time.  Strength and sensation intact.   Skin: Skin is warm and dry.  Psychiatric: He has a normal mood and affect. His behavior is normal.    ED Course  Procedures (including critical care time)  DIAGNOSTIC STUDIES: Oxygen Saturation is 100% on room air, normal by my interpretation.    COORDINATION OF CARE: 8:38 PM- Discussed that symptoms are likely muscular in nature. Will discharge patient with muscle relaxants to manage symptoms. Advised patient to apply ice to the area tonight and heat tomorrow and to take ibuprofen at home. Discussed treatment plan with patient at bedside and patient verbalized agreement.  Labs Review Labs Reviewed - No data to display Imaging Review No results found.  EKG Interpretation   None       MDM   1. Lumbar strain, initial encounter   2. Back pain    No red flags concerning patient's back pain. No s/s of central cord compression or cauda equina. Lower extremities are neurovascularly intact and patient is ambulating without difficulty.   I personally performed the services described in this documentation, which was scribed in my presence. The recorded information has been reviewed and is accurate.     Darren Mace, PA-C 12/13/12 2045

## 2013-01-15 ENCOUNTER — Encounter (HOSPITAL_COMMUNITY): Payer: Self-pay | Admitting: Emergency Medicine

## 2013-01-15 DIAGNOSIS — Z87828 Personal history of other (healed) physical injury and trauma: Secondary | ICD-10-CM | POA: Insufficient documentation

## 2013-01-15 DIAGNOSIS — Z8719 Personal history of other diseases of the digestive system: Secondary | ICD-10-CM | POA: Insufficient documentation

## 2013-01-15 DIAGNOSIS — G8929 Other chronic pain: Secondary | ICD-10-CM | POA: Insufficient documentation

## 2013-01-15 DIAGNOSIS — Z791 Long term (current) use of non-steroidal anti-inflammatories (NSAID): Secondary | ICD-10-CM | POA: Insufficient documentation

## 2013-01-15 DIAGNOSIS — Z79899 Other long term (current) drug therapy: Secondary | ICD-10-CM | POA: Insufficient documentation

## 2013-01-15 DIAGNOSIS — I10 Essential (primary) hypertension: Secondary | ICD-10-CM | POA: Insufficient documentation

## 2013-01-15 DIAGNOSIS — Z87891 Personal history of nicotine dependence: Secondary | ICD-10-CM | POA: Insufficient documentation

## 2013-01-15 DIAGNOSIS — M545 Low back pain, unspecified: Secondary | ICD-10-CM | POA: Insufficient documentation

## 2013-01-15 DIAGNOSIS — M25519 Pain in unspecified shoulder: Secondary | ICD-10-CM | POA: Insufficient documentation

## 2013-01-15 DIAGNOSIS — R109 Unspecified abdominal pain: Secondary | ICD-10-CM | POA: Insufficient documentation
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
## 2013-01-16 ENCOUNTER — Emergency Department (HOSPITAL_COMMUNITY)
Admission: EM | Admit: 2013-01-16 | Discharge: 2013-01-16 | Disposition: A | Discharge status: Home / Self Care | Payer: Self-pay | Attending: Emergency Medicine | Admitting: Emergency Medicine

## 2013-01-16 DIAGNOSIS — M549 Dorsalgia, unspecified: Secondary | ICD-10-CM

## 2013-01-16 DIAGNOSIS — G8929 Other chronic pain: Secondary | ICD-10-CM

## 2013-01-16 MED ORDER — OXYCODONE-ACETAMINOPHEN 5-325 MG PO TABS: 1.0000 | ORAL_TABLET | Freq: Three times a day (TID) | ORAL | Status: DC | PRN

## 2013-01-16 MED ORDER — OXYCODONE-ACETAMINOPHEN 5-325 MG PO TABS
2.0000 | ORAL_TABLET | Freq: Once | ORAL | Status: AC
  Administered 2013-01-16: 2 via ORAL
  Filled 2013-01-16: qty 2

## 2013-01-16 NOTE — ED Notes (Signed)
Pt reports his friend will be driving him home. 

## 2013-01-16 NOTE — ED Provider Notes (Signed)
CSN: [id number]     Arrival date & time 01/15/13  2208 History   First MD Initiated Contact with Patient 01/16/13 0022     Chief Complaint  Patient presents with  . pelvis pain   . collarbone pain    (Consider location/radiation/quality/duration/timing/severity/associated sxs/prior Treatment) HPI Comments: Patient presents to the ED with a chief complaint of chronic pain.  He states that the pain is mostly in his low back, but has also had pain for many years in his collar bone.  He denies chest pain, SOB, nausea, vomiting, diarrhea, constipation, bowel or bladder incontinence, or saddle anesthesia.  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  He also asks me for a referral to a pain specialist.  The history is provided by the patient. No language interpreter was used.    Past Medical History  Diagnosis Date  . Hypertension   . Hernia    Past Surgical History  Procedure Laterality Date  . Pelvic fracture surgery     History reviewed. No pertinent family history. History  Substance Use Topics  . Smoking status: Former Games developer  . Smokeless tobacco: Not on file  . Alcohol Use: Yes     Comment: ocassionally    Review of Systems  All other systems reviewed and are negative.    Allergies  Review of patient's allergies indicates no known allergies.  Home Medications   Current Outpatient Rx  Name  Route  Sig  Dispense  Refill  . diazepam (VALIUM) 5 MG tablet   Oral   Take 1 tablet (5 mg total) by mouth 2 (two) times daily.   10 tablet   0   . ibuprofen (ADVIL,MOTRIN) 800 MG tablet   Oral   Take 1 tablet (800 mg total) by mouth 3 (three) times daily.   21 tablet   0   . oxyCODONE-acetaminophen (PERCOCET/ROXICET) 5-325 MG per tablet   Oral   Take 1 tablet by mouth every 6 (six) hours as needed for pain.   10 tablet   0   . tobramycin (TOBREX) 0.3 % ophthalmic solution   Right Eye   Place 1 drop into the right eye every 4 (four) hours.   5 mL   0    BP 136/89  Pulse 71  Temp(Src) 98.3 F (36.8 C) (Oral)  Resp 18  SpO2 98% Physical Exam  Nursing note and vitals reviewed. Constitutional: He is oriented to person, place, and time. He appears well-developed and well-nourished. No distress.  HENT:  Head: Normocephalic and atraumatic.  Eyes: Conjunctivae and EOM are normal. Right eye exhibits no discharge. Left eye exhibits no discharge. No scleral icterus.  Neck: Normal range of motion. Neck supple. No tracheal deviation present.  Cardiovascular: Normal rate, regular rhythm and normal heart sounds.  Exam reveals no gallop and no friction rub.   No murmur heard. Pulmonary/Chest: Effort normal and breath sounds normal. No respiratory distress. He has no wheezes.  Abdominal: Soft. He exhibits no distension. There is no tenderness.  Musculoskeletal: Normal range of motion.  Lumbar paraspinal muscles tender to palpation, no bony tenderness, step-offs, or gross abnormality or deformity of spine, patient is able to ambulate, moves all extremities  Bilateral great toe extension intact Bilateral plantar/dorsiflexion intact  Neurological: He is alert and oriented to person, place, and time. He has  normal reflexes.  Sensation and strength intact bilaterally Symmetrical reflexes  Skin: Skin is warm. He is not diaphoretic.  Psychiatric: He has a normal mood and affect. His behavior is normal. Judgment and thought content normal.    ED Course  Procedures (including critical care time) Labs Review Labs Reviewed - No data to display Imaging Review No results found.  EKG Interpretation   None       MDM   1. Back pain   2. Chronic pain    Patient with back pain.  No neurological deficits and normal neuro exam.  Patient can walk but states is painful.  No loss of bowel or bladder control.  No concern  for cauda equina.  No fever, night sweats, weight loss, h/o cancer, IVDU.  RICE protocol and pain medicine indicated and discussed with patient.      Roxy Horseman, PA-C 01/16/13 [phone]

## 2013-01-19 ENCOUNTER — Encounter (HOSPITAL_COMMUNITY): Payer: Self-pay | Admitting: Emergency Medicine

## 2013-01-19 ENCOUNTER — Emergency Department (HOSPITAL_COMMUNITY)
Admission: EM | Admit: 2013-01-19 | Discharge: 2013-01-19 | Disposition: A | Discharge status: Home / Self Care | Payer: Self-pay | Attending: Emergency Medicine | Admitting: Emergency Medicine

## 2013-01-19 DIAGNOSIS — Z87891 Personal history of nicotine dependence: Secondary | ICD-10-CM | POA: Insufficient documentation

## 2013-01-19 DIAGNOSIS — M1990 Unspecified osteoarthritis, unspecified site: Secondary | ICD-10-CM

## 2013-01-19 DIAGNOSIS — Z8781 Personal history of (healed) traumatic fracture: Secondary | ICD-10-CM | POA: Insufficient documentation

## 2013-01-19 DIAGNOSIS — M129 Arthropathy, unspecified: Secondary | ICD-10-CM | POA: Insufficient documentation

## 2013-01-19 DIAGNOSIS — Z87828 Personal history of other (healed) physical injury and trauma: Secondary | ICD-10-CM | POA: Insufficient documentation

## 2013-01-19 DIAGNOSIS — Z8719 Personal history of other diseases of the digestive system: Secondary | ICD-10-CM | POA: Insufficient documentation

## 2013-01-19 DIAGNOSIS — I10 Essential (primary) hypertension: Secondary | ICD-10-CM | POA: Insufficient documentation

## 2013-01-19 MED ORDER — KETOROLAC TROMETHAMINE 60 MG/2ML IM SOLN
30.0000 mg | Freq: Once | INTRAMUSCULAR | Status: AC
  Administered 2013-01-19: 30 mg via INTRAMUSCULAR
  Filled 2013-01-19: qty 2

## 2013-01-19 MED ORDER — TRAMADOL HCL 50 MG PO TABS: 50.0000 mg | ORAL_TABLET | Freq: Four times a day (QID) | ORAL | Status: DC | PRN
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
## 2013-01-20 NOTE — ED Provider Notes (Signed)
Medical screening examination/treatment/procedure(s) were performed by non-physician practitioner and as supervising physician I was immediately available for consultation/collaboration.  EKG Interpretation   None         Markan Cazarez W. Money Mckeithan, MD 01/20/13 0758 

## 2013-01-20 NOTE — ED Provider Notes (Signed)
Medical screening examination/treatment/procedure(s) were performed by non-physician practitioner and as supervising physician I was immediately available for consultation/collaboration.  EKG Interpretation   None         Junius Argyle, MD 01/20/13 1022

## 2013-01-31 ENCOUNTER — Other Ambulatory Visit (HOSPITAL_COMMUNITY): Payer: Self-pay | Admitting: Orthopedic Surgery

## 2013-01-31 DIAGNOSIS — M545 Low back pain, unspecified: Secondary | ICD-10-CM

## 2013-02-13 ENCOUNTER — Ambulatory Visit (HOSPITAL_COMMUNITY): Admission: RE | Admit: 2013-02-13 | Payer: Self-pay | Source: Ambulatory Visit

## 2013-02-27 ENCOUNTER — Ambulatory Visit (HOSPITAL_COMMUNITY)
Admission: RE | Admit: 2013-02-27 | Discharge: 2013-02-27 | Disposition: A | Discharge status: Home / Self Care | Payer: Self-pay | Source: Ambulatory Visit | Attending: Orthopedic Surgery | Admitting: Orthopedic Surgery

## 2013-02-27 DIAGNOSIS — M5126 Other intervertebral disc displacement, lumbar region: Secondary | ICD-10-CM | POA: Insufficient documentation

## 2013-02-27 DIAGNOSIS — M545 Low back pain, unspecified: Secondary | ICD-10-CM | POA: Insufficient documentation

## 2013-02-27 DIAGNOSIS — M47817 Spondylosis without myelopathy or radiculopathy, lumbosacral region: Secondary | ICD-10-CM | POA: Insufficient documentation

## 2013-03-16 ENCOUNTER — Emergency Department (HOSPITAL_COMMUNITY)
Admission: EM | Admit: 2013-03-16 | Discharge: 2013-03-17 | Disposition: A | Discharge status: Home / Self Care | Payer: Self-pay | Attending: Emergency Medicine | Admitting: Emergency Medicine

## 2013-03-16 ENCOUNTER — Encounter (HOSPITAL_COMMUNITY): Payer: Self-pay | Admitting: Emergency Medicine

## 2013-03-16 DIAGNOSIS — Z23 Encounter for immunization: Secondary | ICD-10-CM | POA: Insufficient documentation

## 2013-03-16 DIAGNOSIS — Z87891 Personal history of nicotine dependence: Secondary | ICD-10-CM | POA: Insufficient documentation

## 2013-03-16 DIAGNOSIS — S61412A Laceration without foreign body of left hand, initial encounter: Secondary | ICD-10-CM

## 2013-03-16 DIAGNOSIS — W260XXA Contact with knife, initial encounter: Secondary | ICD-10-CM | POA: Insufficient documentation

## 2013-03-16 DIAGNOSIS — W261XXA Contact with sword or dagger, initial encounter: Secondary | ICD-10-CM

## 2013-03-16 DIAGNOSIS — Z8719 Personal history of other diseases of the digestive system: Secondary | ICD-10-CM | POA: Insufficient documentation

## 2013-03-16 DIAGNOSIS — Y92009 Unspecified place in unspecified non-institutional (private) residence as the place of occurrence of the external cause: Secondary | ICD-10-CM | POA: Insufficient documentation

## 2013-03-16 DIAGNOSIS — S61409A Unspecified open wound of unspecified hand, initial encounter: Secondary | ICD-10-CM | POA: Insufficient documentation

## 2013-03-16 DIAGNOSIS — Y939 Activity, unspecified: Secondary | ICD-10-CM | POA: Insufficient documentation

## 2013-03-16 DIAGNOSIS — I10 Essential (primary) hypertension: Secondary | ICD-10-CM | POA: Insufficient documentation

## 2013-03-16 MED ORDER — OXYCODONE-ACETAMINOPHEN 7.5-325 MG PO TABS: 1.0000 | ORAL_TABLET | Freq: Four times a day (QID) | ORAL | Status: DC | PRN

## 2013-03-16 MED ORDER — ONDANSETRON 4 MG PO TBDP
8.0000 mg | ORAL_TABLET | Freq: Once | ORAL | Status: AC
  Administered 2013-03-16: 8 mg via ORAL
  Filled 2013-03-16: qty 2

## 2013-03-16 MED ORDER — TETANUS-DIPHTH-ACELL PERTUSSIS 5-2.5-18.5 LF-MCG/0.5 IM SUSP
0.5000 mL | Freq: Once | INTRAMUSCULAR | Status: AC
  Administered 2013-03-16: 0.5 mL via INTRAMUSCULAR
  Filled 2013-03-16: qty 0.5

## 2013-03-16 MED ORDER — IBUPROFEN 200 MG PO TABS
400.0000 mg | ORAL_TABLET | Freq: Once | ORAL | Status: AC
  Administered 2013-03-16: 400 mg via ORAL
  Filled 2013-03-16: qty 2

## 2013-03-16 MED ORDER — OXYCODONE-ACETAMINOPHEN 5-325 MG PO TABS
1.0000 | ORAL_TABLET | Freq: Once | ORAL | Status: AC
  Administered 2013-03-16: 1 via ORAL
  Filled 2013-03-16: qty 1

## 2013-03-16 MED ORDER — CEPHALEXIN 500 MG PO CAPS: 500.0000 mg | ORAL_CAPSULE | Freq: Four times a day (QID) | ORAL | Status: DC

## 2013-03-16 NOTE — Discharge Instructions (Signed)
Please have your sutures removed in 7-10 days at urgent care or at your doctor's office.  Take medication as prescribed.    Laceration Care, Adult A laceration is a cut or lesion that goes through all layers of the skin and into the tissue just beneath the skin. TREATMENT  Some lacerations may not require closure. Some lacerations may not be able to be closed due to an increased risk of infection. It is important to see your caregiver as soon as possible after an injury to minimize the risk of infection and maximize the opportunity for successful closure. If closure is appropriate, pain medicines may be given, if needed. The wound will be cleaned to help prevent infection. Your caregiver will use stitches (sutures), staples, wound glue (adhesive), or skin adhesive strips to repair the laceration. These tools bring the skin edges together to allow for faster healing and a better cosmetic outcome. However, all wounds will heal with a scar. Once the wound has healed, scarring can be minimized by covering the wound with sunscreen during the day for 1 full year. HOME CARE INSTRUCTIONS  For sutures or staples:  Keep the wound clean and dry.  If you were given a bandage (dressing), you should change it at least once a day. Also, change the dressing if it becomes wet or dirty, or as directed by your caregiver.  Wash the wound with soap and water 2 times a day. Rinse the wound off with water to remove all soap. Pat the wound dry with a clean towel.  After cleaning, apply a thin layer of the antibiotic ointment as recommended by your caregiver. This will help prevent infection and keep the dressing from sticking.  You may shower as usual after the first 24 hours. Do not soak the wound in water until the sutures are removed.  Only take over-the-counter or prescription medicines for pain, discomfort, or fever as directed by your caregiver.  Get your sutures or staples removed as directed by your  caregiver. For skin adhesive strips:  Keep the wound clean and dry.  Do not get the skin adhesive strips wet. You may bathe carefully, using caution to keep the wound dry.  If the wound gets wet, pat it dry with a clean towel.  Skin adhesive strips will fall off on their own. You may trim the strips as the wound heals. Do not remove skin adhesive strips that are still stuck to the wound. They will fall off in time. For wound adhesive:  You may briefly wet your wound in the shower or bath. Do not soak or scrub the wound. Do not swim. Avoid periods of heavy perspiration until the skin adhesive has fallen off on its own. After showering or bathing, gently pat the wound dry with a clean towel.  Do not apply liquid medicine, cream medicine, or ointment medicine to your wound while the skin adhesive is in place. This may loosen the film before your wound is healed.  If a dressing is placed over the wound, be careful not to apply tape directly over the skin adhesive. This may cause the adhesive to be pulled off before the wound is healed.  Avoid prolonged exposure to sunlight or tanning lamps while the skin adhesive is in place. Exposure to ultraviolet light in the first year will darken the scar.  The skin adhesive will usually remain in place for 5 to 10 days, then naturally fall off the skin. Do not pick at the adhesive film. You  may need a tetanus shot if:  You cannot remember when you had your last tetanus shot.  You have never had a tetanus shot. If you get a tetanus shot, your arm may swell, get red, and feel warm to the touch. This is common and not a problem. If you need a tetanus shot and you choose not to have one, there is a rare chance of getting tetanus. Sickness from tetanus can be serious. SEEK MEDICAL CARE IF:   You have redness, swelling, or increasing pain in the wound.  You see a red line that goes away from the wound.  You have yellowish-white fluid (pus) coming from  the wound.  You have a fever.  You notice a bad smell coming from the wound or dressing.  Your wound breaks open before or after sutures have been removed.  You notice something coming out of the wound such as wood or glass.  Your wound is on your hand or foot and you cannot move a finger or toe. SEEK IMMEDIATE MEDICAL CARE IF:   Your pain is not controlled with prescribed medicine.  You have severe swelling around the wound causing pain and numbness or a change in color in your arm, hand, leg, or foot.  Your wound splits open and starts bleeding.  You have worsening numbness, weakness, or loss of function of any joint around or beyond the wound.  You develop painful lumps near the wound or on the skin anywhere on your body. MAKE SURE YOU:   Understand these instructions.  Will watch your condition.  Will get help right away if you are not doing well or get worse. Document Released: 01/09/2005 Document Revised: 04/03/2011 Document Reviewed: 07/05/2010 [facility] Patient Information 2014 [place], [place].

## 2013-03-16 NOTE — ED Notes (Signed)
MD at Bedside.

## 2013-03-16 NOTE — ED Notes (Addendum)
2" laceration to L anterior index finger, cut with a meet grinder, occurred around 1 hr ago, Td, NOT UTD,  CMS intact, flex/ ext strength against resistance strong & present in finger, cap refill <2sec, bleeding controlled, NKDA, rates pain 10/10, throbbing, no meds PTA.

## 2013-03-16 NOTE — ED Notes (Signed)
Physician in room to suture wound.

## 2013-03-16 NOTE — ED Provider Notes (Signed)
CSN: [id number]     Arrival date & time 03/16/13  2031 History   First MD Initiated Contact with Patient 03/16/13 2130     Chief Complaint  Patient presents with  . Extremity Laceration     (Consider location/radiation/quality/duration/timing/severity/associated sxs/prior Treatment) HPI  63 year old male presents for evaluations of finger injury. Patient reports he accidentally cut his left hand with a meat grinder at home approximately 2 hours ago. Complaining of acute onset of sharp pain to palm of hand and index finger.  No numbness, no other injury, no specific treatment tried. Does not remember last Tdap.  Pain is 10/10, throbbing, non radiating.  No specific treatment tried.    Past Medical History  Diagnosis Date  . Hypertension   . Hernia    Past Surgical History  Procedure Laterality Date  . Pelvic fracture surgery     No family history on file. History  Substance Use Topics  . Smoking status: Former Research scientist (life sciences)  . Smokeless tobacco: Not on file  . Alcohol Use: Yes     Comment: ocassionally    Review of Systems  Constitutional: Negative for fever.  Skin: Positive for wound.  Neurological: Negative for numbness.      Allergies  Review of patient's allergies indicates no known allergies.  Home Medications  No current outpatient prescriptions on file. BP 138/111  Pulse 66  Temp(Src) 98.1 F (36.7 C) (Oral)  Resp 16  Ht 5\' 11"  (1.803 m)  Wt 192 lb (87.091 kg)  BMI 26.79 kg/m2  SpO2 93% Physical Exam  Constitutional: He appears well-developed and well-nourished. No distress.  HENT:  Head: Atraumatic.  Eyes: Conjunctivae are normal.  Neck: Normal range of motion. Neck supple.  Musculoskeletal: He exhibits tenderness (L hand: 4cm laceration to palmar region extending to proximal phalanx of index finger.  normal flexion/extension intact distal sensation with brisk cap refill.  not actively bleeding.  ).  Neurological: He is alert.  Skin: No rash noted.   Psychiatric: He has a normal mood and affect.    ED Course  Procedures (including critical care time)  9:48 PM The patient with deep laceration to left hand in the palmar region extending to proximal phalanx of index finger. No obvious bony muscle or nerve or tendon involvement. Normal sensation throughout and patient is neurovascularly intact. X-ray ordered. Tdap given. Will sutured wound.  11:30 PM Wound was sutured by me.  Pt refused xray.  On exam, no obvious bony involvement.  Will cancel xray.  Pt is otherwise neurovascularly intact.    LACERATION REPAIR Performed by: Domenic Moras Authorized byDomenic Moras Consent: Verbal consent obtained. Risks and benefits: risks, benefits and alternatives were discussed Consent given by: patient Patient identity confirmed: provided demographic data Prepped and Draped in normal sterile fashion Wound explored  Laceration Location: L hand, palmar region extending to proximal phalanx of index finger  Laceration Length: 4 cm  No Foreign Bodies seen or palpated  Anesthesia: local infiltration  Local anesthetic: lidocaine 2% w/o epinephrine  Anesthetic total: 6 ml  Irrigation method: syringe Amount of cleaning: standard  Skin closure: prolene 5.0  Number of sutures: 8  Technique: simple interrupted.    Patient tolerance: Patient tolerated the procedure well with no immediate complications.   Labs Review Labs Reviewed - No data to display Imaging Review No results found.  EKG Interpretation   None       MDM   Final diagnoses:  Laceration of left hand    BP 134/84  Pulse  65  Temp(Src) 98.1 F (36.7 C) (Oral)  Resp 16  Ht 5\' 11"  (1.803 m)  Wt 192 lb (87.091 kg)  BMI 26.79 kg/m2  SpO2 97%     Domenic Moras, PA-C 03/16/13 2336

## 2013-03-16 NOTE — ED Notes (Signed)
Saline soaked gauze applied to wound

## 2013-03-17 NOTE — ED Provider Notes (Signed)
Medical screening examination/treatment/procedure(s) were performed by non-physician practitioner and as supervising physician I was immediately available for consultation/collaboration.  EKG Interpretation   None        Ezequiel Essex, MD 03/17/13 [phone]

## 2013-03-23 ENCOUNTER — Emergency Department (HOSPITAL_COMMUNITY): Payer: Self-pay

## 2013-03-23 ENCOUNTER — Emergency Department (HOSPITAL_COMMUNITY)
Admission: EM | Admit: 2013-03-23 | Discharge: 2013-03-23 | Disposition: A | Discharge status: Home / Self Care | Payer: Self-pay | Attending: Emergency Medicine | Admitting: Emergency Medicine

## 2013-03-23 ENCOUNTER — Encounter (HOSPITAL_COMMUNITY): Payer: Self-pay | Admitting: Emergency Medicine

## 2013-03-23 DIAGNOSIS — T8140XA Infection following a procedure, unspecified, initial encounter: Secondary | ICD-10-CM | POA: Insufficient documentation

## 2013-03-23 DIAGNOSIS — S61412A Laceration without foreign body of left hand, initial encounter: Secondary | ICD-10-CM

## 2013-03-23 DIAGNOSIS — Z87891 Personal history of nicotine dependence: Secondary | ICD-10-CM | POA: Insufficient documentation

## 2013-03-23 DIAGNOSIS — I10 Essential (primary) hypertension: Secondary | ICD-10-CM | POA: Insufficient documentation

## 2013-03-23 DIAGNOSIS — L089 Local infection of the skin and subcutaneous tissue, unspecified: Secondary | ICD-10-CM

## 2013-03-23 DIAGNOSIS — Z8719 Personal history of other diseases of the digestive system: Secondary | ICD-10-CM | POA: Insufficient documentation

## 2013-03-23 DIAGNOSIS — Y838 Other surgical procedures as the cause of abnormal reaction of the patient, or of later complication, without mention of misadventure at the time of the procedure: Secondary | ICD-10-CM | POA: Insufficient documentation

## 2013-03-23 MED ORDER — CLINDAMYCIN PHOSPHATE 600 MG/50ML IV SOLN: 600.0000 mg | Freq: Once | INTRAVENOUS | Status: DC

## 2013-03-23 MED ORDER — CEPHALEXIN 500 MG PO CAPS: 500.0000 mg | ORAL_CAPSULE | Freq: Four times a day (QID) | ORAL | Status: DC

## 2013-03-23 MED ORDER — IBUPROFEN 800 MG PO TABS: 800.0000 mg | ORAL_TABLET | Freq: Three times a day (TID) | ORAL | Status: DC

## 2013-03-23 MED ORDER — OXYCODONE-ACETAMINOPHEN 5-325 MG PO TABS
1.0000 | ORAL_TABLET | Freq: Once | ORAL | Status: AC
  Administered 2013-03-23: 1 via ORAL
  Filled 2013-03-23: qty 1

## 2013-03-23 MED ORDER — SULFAMETHOXAZOLE-TRIMETHOPRIM 800-160 MG PO TABS: 1.0000 | ORAL_TABLET | Freq: Two times a day (BID) | ORAL | Status: AC

## 2013-03-23 MED ORDER — SULFAMETHOXAZOLE-TMP DS 800-160 MG PO TABS
1.0000 | ORAL_TABLET | Freq: Once | ORAL | Status: AC
  Administered 2013-03-23: 1 via ORAL
  Filled 2013-03-23: qty 1

## 2013-03-23 MED ORDER — CEPHALEXIN 250 MG PO CAPS
500.0000 mg | ORAL_CAPSULE | Freq: Once | ORAL | Status: AC
  Administered 2013-03-23: 500 mg via ORAL
  Filled 2013-03-23: qty 2

## 2013-03-23 NOTE — Progress Notes (Signed)
Met Patient at bedside.Role of CM explained.Patient reports todays ED visit is a check up visit of his left palm.Patient reports he cut this region on a blender  Last week.Patient has no Insurance or PCP.Education provided on Importance of following ED Discharge Instructions and establishing care with a PCP for Primary health care needs.Patient provided with resource contact sheet for a PCP and reports he will follow up.Verbal education / written resources provided  On the [employer] $4 dollar plan and the [facility] Generic Medication Program,[address] resource card also provided to the patient.Teach back was  Used to ensure patient understanding.

## 2013-03-23 NOTE — ED Notes (Signed)
Patient transported to X-ray 

## 2013-03-23 NOTE — ED Notes (Addendum)
Pt has stitches to left hand since last Sunday. Here today with hand swelling and pointer finger swelling. No drainage from wound. Sensation is intact.

## 2013-03-23 NOTE — ED Provider Notes (Signed)
CSN: [id number]     Arrival date & time 03/23/13  1237 History  This chart was scribed for non-physician practitioner Domenic Moras, working with Carmin Muskrat, MD by Donato Schultz, ED Scribe. This patient was seen in room TR07C/TR07C and the patient's care was started at 1:06 PM.    Chief Complaint  Patient presents with  . Hand Problem    The history is provided by the patient. No language interpreter was used.   HPI Comments: [name] is a 63 y.o. male who presents to the Emergency Department for a wound check on his right palm.  The patient states that he had sutures placed on his right hand after lacerating it with a meat grinder 7 days ago.  The patient states that the area has swollen and is more painful now.  He rates the pain at a 9/10 currently.  The patient states that he had to remove the finger splint due to the swelling.  Report that he did not filled medication prescribed originally due to inability to afford.  Discharge Instruction also recommend return if signs of infection.  Denies fever, chills, new numbness   Past Medical History  Diagnosis Date  . Hypertension   . Hernia    Past Surgical History  Procedure Laterality Date  . Pelvic fracture surgery     No family history on file. History  Substance Use Topics  . Smoking status: Former Research scientist (life sciences)  . Smokeless tobacco: Not on file  . Alcohol Use: Yes     Comment: ocassionally    Review of Systems  Constitutional: Negative for fever.  Skin: Positive for wound (right palm).  All other systems reviewed and are negative.      Allergies  Review of patient's allergies indicates no known allergies.  Home Medications   Current Outpatient Rx  Name  Route  Sig  Dispense  Refill  . cephALEXin (KEFLEX) 500 MG capsule   Oral   Take 1 capsule (500 mg total) by mouth 4 (four) times daily.   28 capsule   0   . oxyCODONE-acetaminophen (PERCOCET) 7.5-325 MG per tablet   Oral   Take 1 tablet by mouth every 6 (six)  hours as needed for pain.   15 tablet   0    Triage Vitals: BP 143/98  Pulse 73  Temp(Src) 97.8 F (36.6 C) (Oral)  Resp 18  SpO2 96%  Physical Exam  Nursing note and vitals reviewed. Constitutional: He is oriented to person, place, and time. He appears well-developed and well-nourished.  HENT:  Head: Normocephalic and atraumatic.  Eyes: EOM are normal.  Neck: Normal range of motion.  Cardiovascular: Normal rate.   Pulmonary/Chest: Effort normal.  Musculoskeletal: Normal range of motion.  Neurological: He is alert and oriented to person, place, and time.  Skin: Skin is warm and dry. There is erythema.  Right hand: 4 cm vertical laceration to the proximal aspects of index finger with suture in place and surrounding erythema and edema consistent with infection.    Psychiatric: He has a normal mood and affect. His behavior is normal.    ED Course  Procedures (including critical care time)  DIAGNOSTIC STUDIES: Oxygen Saturation is 96% on room air, adequate by my interpretation.    COORDINATION OF CARE: 1:07 PM- Discussed a clinical suspicion of an infected wound.  Discussed administering pain medication in the ED and consulting with the hand specialist to develop a proper treatment plan.  The patient agreed to the treatment plan.  3:51 PM I have consulted with Dr. Vanita Panda who evaluate pt and felt suture removal, oral abx and sitz bath is sufficient.  Xray of hand without acute fx or osteomyelitis.  I have request care manager to talk to pt and offer support with abx.  Otherwise, pt stable for discharge.  Close follow up given.  Hand specialist given.  Pt is NVI.    Labs Review Labs Reviewed - No data to display Imaging Review Dg Hand Complete Left  03/23/2013   CLINICAL DATA:  Rule out fracture or osteomyelitis. Trauma second finger with swelling for 1 week.  EXAM: LEFT HAND - COMPLETE 3+ VIEW  COMPARISON:  None.  FINDINGS: There soft tissue swelling over the second digit. No  evidence of fracture. No evidence of bone destruction to suggest osteomyelitis. Minimal subchondral cyst present over the metacarpal heads.  IMPRESSION: No evidence of fracture or evidence of osteomyelitis.   Electronically Signed   By: Marin Olp M.D.   On: 03/23/2013 14:52     EKG Interpretation None      MDM   Final diagnoses:  Laceration of left hand with infection    BP 143/98  Pulse 73  Temp(Src) 97.8 F (36.6 C) (Oral)  Resp 18  SpO2 96%  I have reviewed nursing notes and vital signs. I personally reviewed the imaging tests through PACS system  I reviewed available ER/hospitalization records thought the EMR  I personally performed the services described in this documentation, which was scribed in my presence. The recorded information has been reviewed and is accurate.     Domenic Moras, PA-C 03/23/13 1556

## 2013-03-23 NOTE — ED Provider Notes (Signed)
  This was a shared visit with a mid-level provided (NP or PA).  Throughout the patient's course I was available for consultation/collaboration.    On my exam the patient was in no distress.  However, there was evidence of minor infection around the previously repaired site.  No evidence of systemic infection, nor progression in the extremity.     Carmin Muskrat, MD 03/23/13 [phone]

## 2013-03-23 NOTE — Discharge Instructions (Signed)
Please continue to soak hand in warm water with Dial soap sud several times daily.  Apply neosporin to wound twice daily until infection resolved.  Keep loose dressing to protect wound.  Take antibiotic as prescribed.  Follow up with hand specialist if your infection worsen.    Wound Infection A wound infection happens when a type of germ (bacteria) starts growing in the wound. In some cases, this can cause the wound to break open. If cared for properly, the infected wound will heal from the inside to the outside. Wound infections need treatment. CAUSES An infection is caused by bacteria growing in the wound.  SYMPTOMS   Increase in redness, swelling, or pain at the wound site.  Increase in drainage at the wound site.  Wound or bandage (dressing) starts to smell bad.  Fever.  Feeling tired or fatigued.  Pus draining from the wound. TREATMENT  You caregiver will prescribe antibiotic medicine. The wound infection should improve within 24 to 48 hours. Any redness around the wound should stop spreading and the wound should be less painful.  HOME CARE INSTRUCTIONS   Only take over-the-counter or prescription medicines for pain, discomfort, or fever as directed by your caregiver.  Take your antibiotics as directed. Finish them even if you start to feel better.  Gently wash the area with mild soap and water 2 times a day, or as directed. Rinse off the soap. Pat the area dry with a clean towel. Do not rub the wound. This may cause bleeding.  Follow your caregiver's instructions for how often you need to change the dressing.  Apply ointment and a dressing to the wound as directed.  If the dressing sticks, moisten it with soapy water and gently remove it.  Change the bandage right away if it becomes wet, dirty, or develops a bad smell.  Take showers. Do not take tub baths, swim, or do anything that may soak the wound until it is healed.  Avoid exercises that make you sweat  heavily.  Use anti-itch medicine as directed by your caregiver. The wound may itch when it is healing. Do not pick or scratch at the wound.  Follow up with your caregiver to get your wound rechecked as directed. SEEK MEDICAL CARE IF:  You have an increase in swelling, pain, or redness around the wound.  You have an increase in the amount of pus coming from the wound.  There is a bad smell coming from the wound.  More of the wound breaks open.  You have a fever. MAKE SURE YOU:   Understand these instructions.  Will watch your condition.  Will get help right away if you are not doing well or get worse. Document Released: 10/08/2002 Document Revised: 04/03/2011 Document Reviewed: 05/15/2010 [facility] Patient Information 2014 [place], [place].

## 2013-05-19 ENCOUNTER — Emergency Department (HOSPITAL_COMMUNITY)
Admission: EM | Admit: 2013-05-19 | Discharge: 2013-05-19 | Disposition: A | Discharge status: Home / Self Care | Payer: Self-pay | Attending: Emergency Medicine | Admitting: Emergency Medicine

## 2013-05-19 ENCOUNTER — Encounter (HOSPITAL_COMMUNITY): Payer: Self-pay | Admitting: Emergency Medicine

## 2013-05-19 DIAGNOSIS — Z87891 Personal history of nicotine dependence: Secondary | ICD-10-CM | POA: Insufficient documentation

## 2013-05-19 DIAGNOSIS — Z9889 Other specified postprocedural states: Secondary | ICD-10-CM | POA: Insufficient documentation

## 2013-05-19 DIAGNOSIS — Z87828 Personal history of other (healed) physical injury and trauma: Secondary | ICD-10-CM | POA: Insufficient documentation

## 2013-05-19 DIAGNOSIS — Z8719 Personal history of other diseases of the digestive system: Secondary | ICD-10-CM | POA: Insufficient documentation

## 2013-05-19 DIAGNOSIS — M25559 Pain in unspecified hip: Secondary | ICD-10-CM | POA: Insufficient documentation

## 2013-05-19 DIAGNOSIS — I10 Essential (primary) hypertension: Secondary | ICD-10-CM | POA: Insufficient documentation

## 2013-05-19 DIAGNOSIS — G8929 Other chronic pain: Secondary | ICD-10-CM | POA: Insufficient documentation

## 2013-05-19 DIAGNOSIS — M549 Dorsalgia, unspecified: Secondary | ICD-10-CM | POA: Insufficient documentation

## 2013-05-19 MED ORDER — OXYCODONE-ACETAMINOPHEN 5-325 MG PO TABS: 1.0000 | ORAL_TABLET | Freq: Four times a day (QID) | ORAL | Status: DC | PRN

## 2013-05-19 MED ORDER — OXYCODONE-ACETAMINOPHEN 5-325 MG PO TABS
1.0000 | ORAL_TABLET | Freq: Once | ORAL | Status: AC
Start: 2013-05-19 — End: 2013-05-19
  Administered 2013-05-19: 1 via ORAL
  Filled 2013-05-19: qty 1
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
## 2013-05-19 NOTE — Discharge Instructions (Signed)
Arthralgia  Your caregiver has diagnosed you as suffering from an arthralgia. Arthralgia means there is pain in a joint. This can come from many reasons including:  · Bruising the joint which causes soreness (inflammation) in the joint.  · Wear and tear on the joints which occur as we grow older (osteoarthritis).  · Overusing the joint.  · Various forms of arthritis.  · Infections of the joint.  Regardless of the cause of pain in your joint, most of these different pains respond to anti-inflammatory drugs and rest. The exception to this is when a joint is infected, and these cases are treated with antibiotics, if it is a bacterial infection.  HOME CARE INSTRUCTIONS   · Rest the injured area for as long as directed by your caregiver. Then slowly start using the joint as directed by your caregiver and as the pain allows. Crutches as directed may be useful if the ankles, knees or hips are involved. If the knee was splinted or casted, continue use and care as directed. If an stretchy or elastic wrapping bandage has been applied today, it should be removed and re-applied every 3 to 4 hours. It should not be applied tightly, but firmly enough to keep swelling down. Watch toes and feet for swelling, bluish discoloration, coldness, numbness or excessive pain. If any of these problems (symptoms) occur, remove the ace bandage and re-apply more loosely. If these symptoms persist, contact your caregiver or return to this location.  · For the first 24 hours, keep the injured extremity elevated on pillows while lying down.  · Apply ice for 15-20 minutes to the sore joint every couple hours while awake for the first half day. Then 03-04 times per day for the first 48 hours. Put the ice in a plastic bag and place a towel between the bag of ice and your skin.  · Wear any splinting, casting, elastic bandage applications, or slings as instructed.  · Only take over-the-counter or prescription medicines for pain, discomfort, or fever as  directed by your caregiver. Do not use aspirin immediately after the injury unless instructed by your physician. Aspirin can cause increased bleeding and bruising of the tissues.  · If you were given crutches, continue to use them as instructed and do not resume weight bearing on the sore joint until instructed.  Persistent pain and inability to use the sore joint as directed for more than 2 to 3 days are warning signs indicating that you should see a caregiver for a follow-up visit as soon as possible. Initially, a hairline fracture (break in bone) may not be evident on X-rays. Persistent pain and swelling indicate that further evaluation, non-weight bearing or use of the joint (use of crutches or slings as instructed), or further X-rays are indicated. X-rays may sometimes not show a small fracture until a week or 10 days later. Make a follow-up appointment with your own caregiver or one to whom we have referred you. A radiologist (specialist in reading X-rays) may read your X-rays. Make sure you know how you are to obtain your X-ray results. Do not assume everything is normal if you do not hear from us.  SEEK MEDICAL CARE IF:  Bruising, swelling, or pain increases.  SEEK IMMEDIATE MEDICAL CARE IF:   · Your fingers or toes are numb or blue.  · The pain is not responding to medications and continues to stay the same or get worse.  · The pain in your joint becomes severe.  · You   develop a fever over 102° F (38.9° C).  · It becomes impossible to move or use the joint.  MAKE SURE YOU:   · Understand these instructions.  · Will watch your condition.  · Will get help right away if you are not doing well or get worse.  Document Released: 01/09/2005 Document Revised: 04/03/2011 Document Reviewed: 08/28/2007  [employer]® Patient Information ©2014 [employer].
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
## 2013-05-20 NOTE — ED Provider Notes (Signed)
Medical screening examination/treatment/procedure(s) were performed by non-physician practitioner and as supervising physician I was immediately available for consultation/collaboration.   EKG Interpretation None       Threasa Beards, MD 05/20/13 2047

## 2013-07-28 ENCOUNTER — Emergency Department (HOSPITAL_COMMUNITY)
Admission: EM | Admit: 2013-07-28 | Discharge: 2013-07-28 | Disposition: A | Discharge status: Home / Self Care | Payer: Self-pay | Attending: Emergency Medicine | Admitting: Emergency Medicine

## 2013-07-28 ENCOUNTER — Encounter (HOSPITAL_COMMUNITY): Payer: Self-pay | Admitting: Emergency Medicine

## 2013-07-28 DIAGNOSIS — Z8719 Personal history of other diseases of the digestive system: Secondary | ICD-10-CM | POA: Insufficient documentation

## 2013-07-28 DIAGNOSIS — Z9889 Other specified postprocedural states: Secondary | ICD-10-CM | POA: Insufficient documentation

## 2013-07-28 DIAGNOSIS — M545 Low back pain, unspecified: Secondary | ICD-10-CM | POA: Insufficient documentation

## 2013-07-28 DIAGNOSIS — M549 Dorsalgia, unspecified: Secondary | ICD-10-CM

## 2013-07-28 DIAGNOSIS — Z87891 Personal history of nicotine dependence: Secondary | ICD-10-CM | POA: Insufficient documentation

## 2013-07-28 DIAGNOSIS — I10 Essential (primary) hypertension: Secondary | ICD-10-CM | POA: Insufficient documentation

## 2013-07-28 DIAGNOSIS — G8929 Other chronic pain: Secondary | ICD-10-CM | POA: Insufficient documentation

## 2013-07-28 MED ORDER — OXYCODONE-ACETAMINOPHEN 5-325 MG PO TABS
2.0000 | ORAL_TABLET | Freq: Once | ORAL | Status: AC
  Administered 2013-07-28: 2 via ORAL
  Filled 2013-07-28: qty 2
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
## 2013-07-28 NOTE — ED Provider Notes (Signed)
Medical screening examination/treatment/procedure(s) were performed by non-physician practitioner and as supervising physician I was immediately available for consultation/collaboration.   Dorie Rank, MD 07/28/13 [phone]

## 2013-07-28 NOTE — Discharge Instructions (Signed)

## 2013-11-26 ENCOUNTER — Encounter (HOSPITAL_COMMUNITY): Payer: Self-pay | Admitting: *Deleted

## 2013-11-26 ENCOUNTER — Emergency Department (HOSPITAL_COMMUNITY)
Admission: EM | Admit: 2013-11-26 | Discharge: 2013-11-26 | Disposition: A | Discharge status: Home / Self Care | Payer: Self-pay | Attending: Emergency Medicine | Admitting: Emergency Medicine

## 2013-11-26 DIAGNOSIS — Z87891 Personal history of nicotine dependence: Secondary | ICD-10-CM | POA: Insufficient documentation

## 2013-11-26 DIAGNOSIS — K648 Other hemorrhoids: Secondary | ICD-10-CM | POA: Insufficient documentation

## 2013-11-26 DIAGNOSIS — K6289 Other specified diseases of anus and rectum: Secondary | ICD-10-CM | POA: Insufficient documentation

## 2013-11-26 DIAGNOSIS — I10 Essential (primary) hypertension: Secondary | ICD-10-CM | POA: Insufficient documentation

## 2013-11-26 LAB — COMPREHENSIVE METABOLIC PANEL
ALK PHOS: 85 U/L (ref 39–117)
ALT: 57 U/L — ABNORMAL HIGH (ref 0–53)
AST: 70 U/L — ABNORMAL HIGH (ref 0–37)
Albumin: 3.6 g/dL (ref 3.5–5.2)
Anion gap: 14 (ref 5–15)
BUN: 6 mg/dL (ref 6–23)
CO2: 23 mEq/L (ref 19–32)
CREATININE: 0.91 mg/dL (ref 0.50–1.35)
Calcium: 9.3 mg/dL (ref 8.4–10.5)
Chloride: 104 mEq/L (ref 96–112)
GFR calc non Af Amer: 90 mL/min — ABNORMAL LOW (ref >90)
GLUCOSE: 107 mg/dL — AB (ref 70–99)
POTASSIUM: 4 meq/L (ref 3.7–5.3)
Sodium: 141 mEq/L (ref 137–147)
TOTAL PROTEIN: 7.4 g/dL (ref 6.0–8.3)
Total Bilirubin: 0.9 mg/dL (ref 0.3–1.2)

## 2013-11-26 LAB — CBC WITH DIFFERENTIAL/PLATELET
Basophils Absolute: 0.1 10*3/uL (ref 0.0–0.1)
Basophils Relative: 1 % (ref 0–1)
EOS ABS: 0.1 10*3/uL (ref 0.0–0.7)
Eosinophils Relative: 1 % (ref 0–5)
HEMATOCRIT: 44.1 % (ref 39.0–52.0)
HEMOGLOBIN: 14.9 g/dL (ref 13.0–17.0)
LYMPHS ABS: 2.1 10*3/uL (ref 0.7–4.0)
Lymphocytes Relative: 39 % (ref 12–46)
MCH: 31.1 pg (ref 26.0–34.0)
MCHC: 33.8 g/dL (ref 30.0–36.0)
MCV: 92.1 fL (ref 78.0–100.0)
MONO ABS: 0.7 10*3/uL (ref 0.1–1.0)
MONOS PCT: 13 % — AB (ref 3–12)
NEUTROS PCT: 46 % (ref 43–77)
Neutro Abs: 2.5 10*3/uL (ref 1.7–7.7)
Platelets: 89 10*3/uL — ABNORMAL LOW (ref 150–400)
RBC: 4.79 MIL/uL (ref 4.22–5.81)
RDW: 14.8 % (ref 11.5–15.5)
WBC: 5.4 10*3/uL (ref 4.0–10.5)

## 2013-11-26 MED ORDER — DOCUSATE SODIUM 100 MG PO CAPS: 100.0000 mg | ORAL_CAPSULE | Freq: Every day | ORAL | Status: DC | PRN

## 2013-11-26 MED ORDER — IBUPROFEN 800 MG PO TABS: 800.0000 mg | ORAL_TABLET | Freq: Three times a day (TID) | ORAL | Status: DC | PRN

## 2013-11-26 NOTE — ED Notes (Signed)
[facility] called for update on POC occult blood sample, Sherita states sample was positive. Has not crossed into pt chart yet.

## 2013-11-26 NOTE — ED Notes (Signed)
Pt sts he had bright-red bloody stools once or twice a week.  Has been happening since the beginning of 2015, but decided to come in today per family and friends pressure

## 2013-11-26 NOTE — ED Notes (Signed)
Pt reports having bright red blood in stools for the past year. No acute distress noted at triage.

## 2013-11-26 NOTE — ED Provider Notes (Signed)
CSN: [id number]     Arrival date & time 11/26/13  1540 History   First MD Initiated Contact with Patient 11/26/13 1717     Chief Complaint  Patient presents with  . Blood In Stools     (Consider location/radiation/quality/duration/timing/severity/associated sxs/prior Treatment) Patient is a 63 y.o. male presenting with hematochezia. The history is provided by the patient. No language interpreter was used.  Rectal Bleeding Quality:  Bright red Amount:  Scant Duration:  11 months Timing:  Intermittent Progression:  Unchanged Context: defecation and hemorrhoids   Context: not constipation and not spontaneously   Similar prior episodes: yes   Relieved by:  None tried Worsened by:  Defecation Associated symptoms: no abdominal pain, no dizziness, no epistaxis, no fever, no hematemesis, no light-headedness, no loss of consciousness and no vomiting   Risk factors: no anticoagulant use, no hx of IBD and no NSAID use     Past Medical History  Diagnosis Date  . Hypertension   . Hernia    Past Surgical History  Procedure Laterality Date  . Pelvic fracture surgery     History reviewed. No pertinent family history. History  Substance Use Topics  . Smoking status: Former Research scientist (life sciences)  . Smokeless tobacco: Not on file  . Alcohol Use: Yes     Comment: ocassionally    Review of Systems  Constitutional: Negative for fever and fatigue.  HENT: Negative for nosebleeds.   Respiratory: Negative for chest tightness and shortness of breath.   Cardiovascular: Negative for chest pain.  Gastrointestinal: Positive for blood in stool, hematochezia and rectal pain (intermittent tenderness with bleeding). Negative for nausea, vomiting, abdominal pain, diarrhea and hematemesis.  Skin: Negative for pallor.  Neurological: Negative for dizziness, loss of consciousness, syncope and light-headedness.  Hematological: Does not bruise/bleed easily.  All other systems reviewed and are  negative.     Allergies  Review of patient's allergies indicates no known allergies.  Home Medications   Prior to Admission medications   Medication Sig Start Date End Date Taking? Authorizing Provider  docusate sodium (COLACE) 100 MG capsule Take 1 capsule (100 mg total) by mouth daily as needed for mild constipation. 11/26/13   Amparo Bristol, MD  ibuprofen (ADVIL,MOTRIN) 800 MG tablet Take 1 tablet (800 mg total) by mouth every 8 (eight) hours as needed for mild pain. 11/26/13   Amparo Bristol, MD  oxyCODONE-acetaminophen (PERCOCET/ROXICET) 5-325 MG per tablet Take 1 tablet by mouth every 6 (six) hours as needed for severe pain. 05/19/13   Tiffany Marilu Favre, PA-C   BP 147/92 mmHg  Pulse 63  Temp(Src) 98.3 F (36.8 C) (Oral)  Resp 22  Ht 5\' 11"  (1.803 m)  Wt 190 lb (86.183 kg)  BMI 26.51 kg/m2  SpO2 97% Physical Exam  Constitutional: He is oriented to person, place, and time. He appears well-developed and well-nourished. No distress.  HENT:  Head: Normocephalic.  Eyes: Conjunctivae are normal.  Cardiovascular: Normal rate, regular rhythm and normal heart sounds.   Pulmonary/Chest: Effort normal and breath sounds normal.  Abdominal: Soft. He exhibits no distension. There is no tenderness.  Genitourinary: Rectal exam shows external hemorrhoid and tenderness (of hemorrhoid). Rectal exam shows no internal hemorrhoid, no fissure and no mass. Guaiac positive stool.  Neurological: He is alert and oriented to person, place, and time.  Skin: Skin is warm.  Vitals reviewed.   ED Course  Procedures (including critical care time) Labs Review Labs Reviewed  CBC WITH DIFFERENTIAL - Abnormal; Notable for the following:  Platelets 89 (*)    Monocytes Relative 13 (*)    All other components within normal limits  COMPREHENSIVE METABOLIC PANEL - Abnormal; Notable for the following:    Glucose, Bld 107 (*)    AST 70 (*)    ALT 57 (*)    GFR calc non Af Amer 90 (*)    All other components  within normal limits  POC OCCULT BLOOD, ED    Imaging Review No results found.   EKG Interpretation None      MDM   Final diagnoses:  Other hemorrhoids    63 y/o male with intermittent blood in stool x 11 mo. Reports 1-2 episodes a week. No abdominal symptoms or vomiting. No melena. No s/s of anemia. Abdominal exam benign. Hemorrhoid on rectal exam, mildly tender. No gross blood. Hemoccult +. Hgb stable. Pt requesting motrin for ventral hernia present for months which he has been evaluated for surgical repair. No complications of this today. Appropriate for d/c with referral to PCP.     Amparo Bristol, MD 11/26/13 2314  [name], MD 12/01/13 [phone]

## 2013-11-26 NOTE — Discharge Instructions (Signed)

## 2013-11-27 LAB — POC OCCULT BLOOD, ED: Fecal Occult Bld: POSITIVE — AB

## 2014-04-11 ENCOUNTER — Emergency Department (HOSPITAL_COMMUNITY): Payer: Self-pay

## 2014-04-11 ENCOUNTER — Encounter (HOSPITAL_COMMUNITY): Payer: Self-pay | Admitting: Emergency Medicine

## 2014-04-11 ENCOUNTER — Emergency Department (HOSPITAL_COMMUNITY)
Admission: EM | Admit: 2014-04-11 | Discharge: 2014-04-11 | Disposition: A | Discharge status: Home / Self Care | Payer: Self-pay | Attending: Emergency Medicine | Admitting: Emergency Medicine

## 2014-04-11 DIAGNOSIS — Z87891 Personal history of nicotine dependence: Secondary | ICD-10-CM | POA: Insufficient documentation

## 2014-04-11 DIAGNOSIS — M25552 Pain in left hip: Secondary | ICD-10-CM

## 2014-04-11 DIAGNOSIS — Y9389 Activity, other specified: Secondary | ICD-10-CM | POA: Insufficient documentation

## 2014-04-11 DIAGNOSIS — Y998 Other external cause status: Secondary | ICD-10-CM | POA: Insufficient documentation

## 2014-04-11 DIAGNOSIS — Y9289 Other specified places as the place of occurrence of the external cause: Secondary | ICD-10-CM | POA: Insufficient documentation

## 2014-04-11 DIAGNOSIS — Z8719 Personal history of other diseases of the digestive system: Secondary | ICD-10-CM | POA: Insufficient documentation

## 2014-04-11 DIAGNOSIS — W14XXXA Fall from tree, initial encounter: Secondary | ICD-10-CM | POA: Insufficient documentation

## 2014-04-11 DIAGNOSIS — I10 Essential (primary) hypertension: Secondary | ICD-10-CM | POA: Insufficient documentation

## 2014-04-11 DIAGNOSIS — W19XXXA Unspecified fall, initial encounter: Secondary | ICD-10-CM

## 2014-04-11 DIAGNOSIS — S79912A Unspecified injury of left hip, initial encounter: Secondary | ICD-10-CM | POA: Insufficient documentation

## 2014-04-11 MED ORDER — TRAMADOL HCL 50 MG PO TABS: 50.0000 mg | ORAL_TABLET | Freq: Four times a day (QID) | ORAL | Status: DC | PRN

## 2014-04-11 MED ORDER — OXYCODONE-ACETAMINOPHEN 5-325 MG PO TABS
2.0000 | ORAL_TABLET | Freq: Once | ORAL | Status: AC
  Administered 2014-04-11: 2 via ORAL
  Filled 2014-04-11: qty 2

## 2014-04-11 NOTE — ED Notes (Signed)
Patient here with left hip pain secondary to sliding down tree today and landing hard on feet. 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 Did not fall do to safety belt. Previous injury to pelvis with hardware placement reported. States that he has been able to walk since incident, but uses a cane already. Incident occurred around 1100 today. Presents now because the pain is getting worse.

## 2014-04-11 NOTE — ED Provider Notes (Signed)
CSN: [id number]     Arrival date & time 04/11/14  2019 History  This chart was scribed for Darren Mons, PA-C working with Dorie Rank, MD by Mercy Moore, ED Scribe. This patient was seen in room TR09C/TR09C and the patient's care was started at 8:43 PM.   Chief Complaint  Patient presents with  . Hip Pain   The history is provided by the patient. No language interpreter was used.   HPI Comments: [name] is a 64 y.o. male who presents to the Emergency Department with a left hip injury incurred after falling from a tree today. 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 Patient reports losing his footing, sliding down the tree and landing firmly on his feet. Approximate eight foot descend. Patient ambulates with cane at baseline. Patient ambulatory with cane since the incident; however he reports worsening left hip pain with ambulation and bearing weight. Patient shares previous pelvice fracture in '98 repaired with hardware placement. Patient states the incident has aggravated his hip.     Past Medical History  Diagnosis Date  . Hypertension   . Hernia    Past Surgical History  Procedure Laterality Date  . Pelvic fracture surgery     History reviewed. No pertinent family history. History  Substance Use Topics  . Smoking status: Former Research scientist (life sciences)  . Smokeless tobacco: Not on file  . Alcohol Use: Yes     Comment: ocassionally    Review of Systems  Constitutional: Negative for fever and chills.  Musculoskeletal: Positive for arthralgias. Negative for back pain.  Neurological: Negative for weakness and numbness.  All other systems reviewed and are negative.  Allergies  Review of patient's allergies indicates no known allergies.  Home Medications   Prior to Admission medications   Medication Sig Start Date End Date Taking? Authorizing Provider  docusate sodium (COLACE) 100 MG capsule Take 1 capsule (100 mg total) by mouth  daily as needed for mild constipation. 11/26/13   Amparo Bristol, MD  ibuprofen (ADVIL,MOTRIN) 800 MG tablet Take 1 tablet (800 mg total) by mouth every 8 (eight) hours as needed for mild pain. 11/26/13   Amparo Bristol, MD  oxyCODONE-acetaminophen (PERCOCET/ROXICET) 5-325 MG per tablet Take 1 tablet by mouth every 6 (six) hours as needed for severe pain. 05/19/13   Tiffany Carlota Raspberry, PA-C  traMADol (ULTRAM) 50 MG tablet Take 1 tablet (50 mg total) by mouth every 6 (six) hours as needed. 04/11/14   [name], PA-C   Triage Vitals: BP 149/102 mmHg  Pulse 76  Temp(Src) 97.8 F (36.6 C) (Oral)  Resp 18  SpO2 96% Physical Exam  Constitutional: He is oriented to person, place, and time. He appears well-developed and well-nourished. No distress.  HENT:  Head: Normocephalic and atraumatic.  Eyes: Conjunctivae and EOM are normal.  Neck: Normal range of motion. Neck supple.  Cardiovascular: Normal rate, regular rhythm and normal heart sounds.   Pulmonary/Chest: Effort normal and breath sounds normal.  Musculoskeletal:  L hip TTP laterally, over SI joint, and anterior over iliac crest. No shortening or rotation of extremity. FROM, pain with hip abduction.  Neurological: He is alert and oriented to person, place, and time.  Skin: Skin is warm and dry.  Psychiatric: He has a normal mood and affect. His behavior is normal.  Nursing note and vitals reviewed.   ED Course  Procedures (including critical care time)  COORDINATION OF CARE: 10:00 PM- Discussed treatment  plan with patient at bedside and patient agreed to plan.   Labs Review Labs Reviewed - No data to display  Imaging Review Dg Hip Unilat With Pelvis 2-3 Views Left  04/11/2014   CLINICAL DATA:  [place] [address] 6-8 feet landing on both of his feet.  EXAM: LEFT HIP (WITH PELVIS) 2-3 VIEWS  COMPARISON:  None.  FINDINGS: There are old healed pubic ramus fracture deformities bilaterally. There are orthopedic screws transfixing the sacroiliac joints  bilaterally. There is no acute fracture. There is no dislocation. No significant arthritic changes are evident about the hip. No acute soft tissue abnormality is evident.  IMPRESSION: Old posttraumatic and postsurgical changes.  No acute findings.   Electronically Signed   By: Andreas Newport M.D.   On: 04/11/2014 21:50     EKG Interpretation None      MDM   Final diagnoses:  Fall  Left hip pain   NAD. Hypertensive, vitals otherwise stable. Or vascularly intact. X-ray without acute finding. He was concerned given his history of prior crush injury to pelvis. Ambulates without difficulty with his cane. Advised rest and ice, along with NSAIDs. Tramadol for pain. Resources given for PCP follow-up. Asymptomatic elevated blood pressure. Aware to follow-up with the wellness clinic or obtain care with a primary care physician from the resource guide. Stable for discharge. Return precautions given. Patient states understanding of treatment care plan and is agreeable.  I personally performed the services described in this documentation, which was scribed in my presence. The recorded information has been reviewed and is accurate.   [name], PA-C 04/11/14 2202  [name], PA-C 04/11/14 2204  Dorie Rank, MD 04/12/14 [phone]

## 2014-04-11 NOTE — Discharge Instructions (Signed)
Take tramadol as directed for pain. Apply ice to your hip. Follow-up with the wellness clinic or one of the resources below to establish care with a primary care physician.   Hip Pain Your hip is the joint between your upper legs and your lower pelvis. The bones, cartilage, tendons, and muscles of your hip joint perform a lot of work each day supporting your body weight and allowing you to move around. Hip pain can range from a minor ache to severe pain in one or both of your hips. Pain may be felt on the inside of the hip joint near the groin, or the outside near the buttocks and upper thigh. You may have swelling or stiffness as well.  HOME CARE INSTRUCTIONS   Take medicines only as directed by your health care provider.  Apply ice to the injured area:  Put ice in a plastic bag.  Place a towel between your skin and the bag.  Leave the ice on for 15-20 minutes at a time, 3-4 times a day.  Keep your leg raised (elevated) when possible to lessen swelling.  Avoid activities that cause pain.  Follow specific exercises as directed by your health care provider.  Sleep with a pillow between your legs on your most comfortable side.  Record how often you have hip pain, the location of the pain, and what it feels like. SEEK MEDICAL CARE IF:   You are unable to put weight on your leg.  Your hip is red or swollen or very tender to touch.  Your pain or swelling continues or worsens after 1 week.  You have increasing difficulty walking.  You have a fever. SEEK IMMEDIATE MEDICAL CARE IF:   You have fallen.  You have a sudden increase in pain and swelling in your hip. MAKE SURE YOU:   Understand these instructions.  Will watch your condition.  Will get help right away if you are not doing well or get worse. Document Released: 06/29/2009 Document Revised: 05/26/2013 Document Reviewed: 09/05/2012 [facility] Patient Information 2015 [place], [place]. This information is not intended to  replace advice given to you by your health care provider. Make sure you discuss any questions you have with your health care provider.  RESOURCE GUIDE  Chronic Pain Problems: Contact [facility]  [phone] Patients need to be referred by their primary care doctor.  Insufficient Money for Medicine: Contact [employer]:  call "211."   No Primary Care Doctor: - Call [facility]  [phone] - can help you locate a primary care doctor that  accepts your insurance, provides certain services, etc. - Physician Referral Service- [phone]  Agencies that provide inexpensive medical care: - [facility]  [facility]  [phone] - [facility]  [phone] - [facility]  [phone] - [facility]  [facility]  [phone]  [place] Providers: - [facility]- [address]  [phone], Mon-Fri 9am-7pm, Sat 9am-1pm - [facility]- [place], [place], [address]  [place]- [address]  [place], [address], [phone]  Only accepts [place] patients after they have their name  applied to their card  Self Pay (no insurance) in [place]: - Sickle Cell Patients: Dr Kevan Ny, [facility]  [place], [facility] [place] [facility] [place]- [address], [facility]- see information above (Speak to [employer] if you do not have insurance)       -  [facility]- [place],  [address], [place]  Dr Vista Lawman-  [address], [place], [place],  [place]       -  [facility] and [place], [phone]       -  [facility]- [address], [place], also [address], [phone]       -    [facility]- [address], [place], 1st & 3rd Saturday        every month, 10am-1pm  1) Find a Doctor and Pay Out of Pocket Although you won't have to find out who is covered by your insurance plan, it is a good idea to ask around and get recommendations. You will then need to call the office and see if the doctor you have chosen will accept you as a new patient and what types of options they offer for patients who are self-pay. Some doctors offer discounts or will set up payment plans for their patients who do not have insurance, but you will need to ask so you aren't surprised when you get to your appointment.  2) Contact Your Local Health Department Not all health departments have doctors that can see patients for sick visits, but many do, so it is worth a call to see if yours does. If you don't know where your local health department is, you can check in your phone book. The CDC also has a tool to help you locate your state's health department, and many state websites also have listings of all of their local health departments.  3) Find a [facility] If your illness is not likely to be very severe or complicated, you may want to try a walk in clinic. These are popping up all over the country in pharmacies, drugstores, and shopping centers. They're usually staffed by nurse practitioners or physician assistants that have been trained to treat common illnesses and complaints. They're usually fairly quick and inexpensive. However, if you have serious medical issues or chronic medical problems, these are probably not your best option

## 2014-05-20 ENCOUNTER — Encounter (HOSPITAL_COMMUNITY): Payer: Self-pay | Admitting: Neurology

## 2014-05-20 ENCOUNTER — Emergency Department (HOSPITAL_COMMUNITY)
Admission: EM | Admit: 2014-05-20 | Discharge: 2014-05-20 | Disposition: A | Discharge status: Home / Self Care | Payer: Self-pay | Attending: Emergency Medicine | Admitting: Emergency Medicine

## 2014-05-20 DIAGNOSIS — T1591XA Foreign body on external eye, part unspecified, right eye, initial encounter: Secondary | ICD-10-CM

## 2014-05-20 DIAGNOSIS — Z87891 Personal history of nicotine dependence: Secondary | ICD-10-CM | POA: Insufficient documentation

## 2014-05-20 DIAGNOSIS — I10 Essential (primary) hypertension: Secondary | ICD-10-CM | POA: Insufficient documentation

## 2014-05-20 DIAGNOSIS — L819 Disorder of pigmentation, unspecified: Secondary | ICD-10-CM | POA: Insufficient documentation

## 2014-05-20 DIAGNOSIS — Y9389 Activity, other specified: Secondary | ICD-10-CM | POA: Insufficient documentation

## 2014-05-20 DIAGNOSIS — Y998 Other external cause status: Secondary | ICD-10-CM | POA: Insufficient documentation

## 2014-05-20 DIAGNOSIS — Y9289 Other specified places as the place of occurrence of the external cause: Secondary | ICD-10-CM | POA: Insufficient documentation

## 2014-05-20 DIAGNOSIS — X58XXXA Exposure to other specified factors, initial encounter: Secondary | ICD-10-CM | POA: Insufficient documentation

## 2014-05-20 DIAGNOSIS — Z8719 Personal history of other diseases of the digestive system: Secondary | ICD-10-CM | POA: Insufficient documentation

## 2014-05-20 DIAGNOSIS — Z79899 Other long term (current) drug therapy: Secondary | ICD-10-CM | POA: Insufficient documentation

## 2014-05-20 DIAGNOSIS — Z792 Long term (current) use of antibiotics: Secondary | ICD-10-CM | POA: Insufficient documentation

## 2014-05-20 DIAGNOSIS — T1501XA Foreign body in cornea, right eye, initial encounter: Secondary | ICD-10-CM | POA: Insufficient documentation

## 2014-05-20 MED ORDER — FLUORESCEIN SODIUM 1 MG OP STRP
1.0000 | ORAL_STRIP | Freq: Once | OPHTHALMIC | Status: AC
  Administered 2014-05-20: 1 via OPHTHALMIC
  Filled 2014-05-20: qty 1

## 2014-05-20 MED ORDER — ERYTHROMYCIN 5 MG/GM OP OINT: 1.0000 "application " | TOPICAL_OINTMENT | Freq: Four times a day (QID) | OPHTHALMIC | Status: DC

## 2014-05-20 MED ORDER — IBUPROFEN 800 MG PO TABS: 800.0000 mg | ORAL_TABLET | Freq: Three times a day (TID) | ORAL | Status: DC

## 2014-05-20 MED ORDER — IBUPROFEN 800 MG PO TABS
800.0000 mg | ORAL_TABLET | Freq: Once | ORAL | Status: AC
  Administered 2014-05-20: 800 mg via ORAL
  Filled 2014-05-20: qty 1

## 2014-05-20 MED ORDER — HYDROCODONE-ACETAMINOPHEN 5-325 MG PO TABS
2.0000 | ORAL_TABLET | Freq: Once | ORAL | Status: AC
  Administered 2014-05-20: 2 via ORAL
  Filled 2014-05-20: qty 2

## 2014-05-20 MED ORDER — TETRACAINE HCL 0.5 % OP SOLN
2.0000 [drp] | Freq: Once | OPHTHALMIC | Status: AC
  Administered 2014-05-20: 2 [drp] via OPHTHALMIC
  Filled 2014-05-20: qty 2

## 2014-05-20 MED ORDER — ERYTHROMYCIN 5 MG/GM OP OINT
1.0000 "application " | TOPICAL_OINTMENT | Freq: Once | OPHTHALMIC | Status: AC
  Administered 2014-05-20: 1 via OPHTHALMIC
  Filled 2014-05-20: qty 3.5

## 2014-05-20 NOTE — ED Provider Notes (Signed)
CSN: [id number]     Arrival date & time 05/20/14  0706 History   First MD Initiated Contact with Patient 05/20/14 [phone]     Chief Complaint  Patient presents with  . Foreign Body in [place]     (Consider location/radiation/quality/duration/timing/severity/associated sxs/prior Treatment) HPI Patient was working on cutting wood with a chain saw yesterday. He states he thinks something got into his right eye. He thought it might come out overnight but this morning it still scratching and burning. There is no clear tearing with no change in vision. Past Medical History  Diagnosis Date  . Hypertension   . Hernia    Past Surgical History  Procedure Laterality Date  . Pelvic fracture surgery     No family history on file. History  Substance Use Topics  . Smoking status: Former Research scientist (life sciences)  . Smokeless tobacco: Not on file  . Alcohol Use: Yes     Comment: ocassionally    Review of Systems  Constitutional: No fever no chills  Allergies  Review of patient's allergies indicates no known allergies.  Home Medications   Prior to Admission medications   Medication Sig Start Date End Date Taking? Authorizing Provider  docusate sodium (COLACE) 100 MG capsule Take 1 capsule (100 mg total) by mouth daily as needed for mild constipation. Patient not taking: Reported on 05/20/2014 11/26/13   Amparo Bristol, MD  erythromycin ophthalmic ointment Place 1 application into the right eye 4 (four) times daily. 05/20/14   [name], MD  ibuprofen (ADVIL,MOTRIN) 800 MG tablet Take 1 tablet (800 mg total) by mouth every 8 (eight) hours as needed for mild pain. Patient not taking: Reported on 05/20/2014 11/26/13   Amparo Bristol, MD  ibuprofen (ADVIL,MOTRIN) 800 MG tablet Take 1 tablet (800 mg total) by mouth 3 (three) times daily. 05/20/14   [name], MD  oxyCODONE-acetaminophen (PERCOCET/ROXICET) 5-325 MG per tablet Take 1 tablet by mouth every 6 (six) hours as needed for severe pain. Patient not taking:  Reported on 05/20/2014 05/19/13   Delos Haring, PA-C  traMADol (ULTRAM) 50 MG tablet Take 1 tablet (50 mg total) by mouth every 6 (six) hours as needed. Patient not taking: Reported on 05/20/2014 04/11/14   Hessie Diener Hess, PA-C   BP 136/85 mmHg  Pulse 67  Temp(Src) 97.9 F (36.6 C) (Oral)  Resp 14  Ht 5\' 11"  (1.803 m)  Wt 200 lb (90.719 kg)  BMI 27.91 kg/m2  SpO2 94% Physical Exam  Constitutional: He is oriented to person, place, and time. He appears well-developed and well-nourished.  HENT:  Head: Normocephalic and atraumatic.  Eyes: EOM are normal. Pupils are equal, round, and reactive to light.  Moderate diffuse scleral injection on the right eye. Fluoroscopy seen staining does not show any uptake over the cornea. There is a small amount of foreskin uptake to the 9:00 position of the conjunctiva adjacent to the cornea. The eye was examined under magnification with eversion of the lids. No foreign body was identified. There is incidental note of a small patch of hyperpigmentation of the conjunctiva at approximately the 11:00 position. The left eye also has a patch of faint hyperpigmentation at the medial 7:00 position.  Pulmonary/Chest: Effort normal.  Neurological: He is alert and oriented to person, place, and time. Coordination normal.  Skin: Skin is warm and dry.  Psychiatric: He has a normal mood and affect.    ED Course  Procedures (including critical care time) Labs Review Labs Reviewed - No data to display  Imaging Review No results found.   EKG Interpretation None      MDM   Final diagnoses:  Eye foreign body, right, initial encounter  Hyperpigmentation   patient reports a possible foreign body to the eye. At this time none is visible by examination with lid eversion and fluoroscopy seen staining. There is no indication of any globe injury. The patient will be treated with anti-inflammatories and erythromycin ophthalmic ointment. He is counseled to have ophthalmology  follow-up is no etiology was identified. He is also counseled on follow-up chest to assess for the small hyperpigmented patches on his conjunctiva to make sure these are normal variant.    [name], MD 05/20/14 [phone]

## 2014-05-20 NOTE — Discharge Instructions (Signed)
Eye, Foreign Body A foreign body is an object that should not be there. The object could be near, on, or in the eye. HOME CARE If your doctor prescribes an eye patch:  Keep the eye patch on. Do this until you see your doctor again.  Do not remove the patch to put in medicine unless your doctor tells you.  Retape it as it was before:  When replacing the patch.  If the patch comes loose.  Do not drive or use machinery.  Only take medicine as told by your doctor. If your doctor does not prescribe an eye patch:  Keep the eye closed as much as possible.  Do not rub the eye.  Wear dark glasses in bright light.  Do not wear contact lenses until the eye feels normal, or as told by your doctor.  Wear protective eye covering, especially when using high speed tools.  Only take medicine as told by your doctor. GET HELP RIGHT AWAY IF:   Your pain gets worse.  Your vision changes.  You have problems with the eye patch.  The injury gets larger.  There is fluid (discharge) coming from the eye.  You get puffiness (swelling) and soreness.  You have an oral temperature above 102 F (38.9 C), not controlled by medicine.  Your baby is older than 3 months with a rectal temperature of 102 F (38.9 C) or higher.  Your baby is 54 months old or younger with a rectal temperature of 100.4 F (38 C) or higher. MAKE SURE YOU:   Understand these instructions.  Will watch your condition.  Will get help right away if you are not doing well or get worse. Document Released: 06/29/2009 Document Revised: 04/03/2011 Document Reviewed: 06/06/2012 [facility] Patient Information 2015 [place], [place]. This information is not intended to replace advice given to you by your health care provider. Make sure you discuss any questions you have with your health care provider.

## 2014-05-20 NOTE — ED Notes (Signed)
Pt reports using chainsaw yesterday to trim trees, thinks he may have gotten something in his right eye. Pain to eye and watery.

## 2014-07-05 ENCOUNTER — Emergency Department (HOSPITAL_COMMUNITY)
Admission: EM | Admit: 2014-07-05 | Discharge: 2014-07-05 | Disposition: A | Discharge status: Home / Self Care | Payer: Self-pay | Attending: Emergency Medicine | Admitting: Emergency Medicine

## 2014-07-05 ENCOUNTER — Encounter (HOSPITAL_COMMUNITY): Payer: Self-pay

## 2014-07-05 DIAGNOSIS — Z792 Long term (current) use of antibiotics: Secondary | ICD-10-CM | POA: Insufficient documentation

## 2014-07-05 DIAGNOSIS — M545 Low back pain, unspecified: Secondary | ICD-10-CM

## 2014-07-05 DIAGNOSIS — I10 Essential (primary) hypertension: Secondary | ICD-10-CM | POA: Insufficient documentation

## 2014-07-05 DIAGNOSIS — Z791 Long term (current) use of non-steroidal anti-inflammatories (NSAID): Secondary | ICD-10-CM | POA: Insufficient documentation

## 2014-07-05 DIAGNOSIS — Z8719 Personal history of other diseases of the digestive system: Secondary | ICD-10-CM | POA: Insufficient documentation

## 2014-07-05 DIAGNOSIS — Z87891 Personal history of nicotine dependence: Secondary | ICD-10-CM | POA: Insufficient documentation

## 2014-07-05 MED ORDER — METHOCARBAMOL 500 MG PO TABS: 500.0000 mg | ORAL_TABLET | Freq: Two times a day (BID) | ORAL | Status: DC

## 2014-07-05 MED ORDER — HYDROCODONE-ACETAMINOPHEN 5-325 MG PO TABS
2.0000 | ORAL_TABLET | Freq: Once | ORAL | Status: AC
  Administered 2014-07-05: 2 via ORAL
  Filled 2014-07-05: qty 2

## 2014-07-05 MED ORDER — HYDROCODONE-ACETAMINOPHEN 5-325 MG PO TABS
1.0000 | ORAL_TABLET | Freq: Four times a day (QID) | ORAL | Status: DC | PRN
Start: 2014-07-05 — End: 2014-09-14

## 2014-07-05 MED ORDER — NAPROXEN 500 MG PO TABS: 500.0000 mg | ORAL_TABLET | Freq: Two times a day (BID) | ORAL | Status: DC

## 2014-07-05 NOTE — ED Notes (Signed)
Onset yesterday pt was climbing tress cutting them down.  Last night mid and lower back started hurting.  Pt has not taken anything for pain. No pain/nuimbness/tingling in LE.

## 2014-07-05 NOTE — Discharge Instructions (Signed)
Followup with orthopedics if symptoms continue. Use conservative methods at home including heat therapy and cold therapy as we discussed. More information on cold therapy is listed below.  It is not recommended to use heat treatment directly after an acute injury. ° °SEEK IMMEDIATE MEDICAL ATTENTION IF: °New numbness, tingling, weakness, or problem with the use of your arms or legs.  °Severe back pain not relieved with medications.  °Change in bowel or bladder control.  °Increasing pain in any areas of the body (such as chest or abdominal pain).  °Shortness of breath, dizziness or fainting.  °Nausea (feeling sick to your stomach), vomiting, fever, or sweats. ° °COLD THERAPY DIRECTIONS:  °Ice or gel packs can be used to reduce both pain and swelling. Ice is the most helpful within the first 24 to 48 hours after an injury or flareup from overusing a muscle or joint.  Ice is effective, has very few side effects, and is safe for most people to use.  ° °If you expose your skin to cold temperatures for too long or without the proper protection, you can damage your skin or nerves. Watch for signs of skin damage due to cold.  ° °HOME CARE INSTRUCTIONS  °Follow these tips to use ice and cold packs safely.  °Place a dry or damp towel between the ice and skin. A damp towel will cool the skin more quickly, so you may need to shorten the time that the ice is used.  °For a more rapid response, add gentle compression to the ice.  °Ice for no more than 10 to 20 minutes at a time. The bonier the area you are icing, the less time it will take to get the benefits of ice.  °Check your skin after 5 minutes to make sure there are no signs of a poor response to cold or skin damage.  °Rest 20 minutes or more in between uses.  °Once your skin is numb, you can end your treatment. You can test numbness by very lightly touching your skin. The touch should be so light that you do not see the skin dimple from the pressure of your fingertip. When  using ice, most people will feel these normal sensations in this order: cold, burning, aching, and numbness.  °Do not use ice on someone who cannot communicate their responses to pain, such as small children or people with dementia.  ° °HOW TO MAKE AN ICE PACK  °To make an ice pack, do one of the following:  °Place crushed ice or a bag of frozen vegetables in a sealable plastic bag. Squeeze out the excess air. Place this bag inside another plastic bag. Slide the bag into a pillowcase or place a damp towel between your skin and the bag.  °Mix 3 parts water with 1 part rubbing alcohol. Freeze the mixture in a sealable plastic bag. When you remove the mixture from the freezer, it will be slushy. Squeeze out the excess air. Place this bag inside another plastic bag. Slide the bag into a pillowcase or place a damp towel between your skin and the bag.  ° °SEEK MEDICAL CARE IF:  °You develop white spots on your skin. This may give the skin a blotchy (mottled) appearance.  °Your skin turns blue or pale.  °Your skin becomes waxy or hard.  °Your swelling gets worse.  °MAKE SURE YOU:  °Understand these instructions.  °Will watch your condition.  °Will get help right away if you are not doing well or   get worse.

## 2014-07-05 NOTE — ED Provider Notes (Signed)
CSN: [id number]     Arrival date & time 07/05/14  2059 History   None   This chart was scribed for non-physician practitioner, Hyman Bible, PA-C, working with Pamella Pert, MD by Terressa Koyanagi, ED Scribe. This patient was seen in room TR09C/TR09C and the patient's care was started at 10:48 PM.  Chief Complaint  Patient presents with  . Back Pain   The history is provided by the patient. No language interpreter was used.   PCP: No PCP Per Patient HPI Comments: [name] is a 64 y.o. male, with PMH noted below including Hx of back pain, who presents to the Emergency Department complaining of atraumatic, sudden onset, radiating, constant mid/lower left sided back pain radiating to the left leg onset last night few hours after pt spent the day climbing and cutting down trees. Pt also complains of associated mild numbness of LE (though pt notes he has intermittent sensation of numbness at baseline). Pt denies taking any measures at home to alleviate his Sx. Pt denies fever, chills, saddle anaesthesia, urinary incontinence, or bowel incontinence.   Past Medical History  Diagnosis Date  . Hypertension   . Hernia    Past Surgical History  Procedure Laterality Date  . Pelvic fracture surgery     History reviewed. No pertinent family history. History  Substance Use Topics  . Smoking status: Former Research scientist (life sciences)  . Smokeless tobacco: Not on file  . Alcohol Use: Yes     Comment: ocassionally    Review of Systems  Constitutional: Negative for fever and chills.  Musculoskeletal: Positive for back pain.  Neurological: Positive for numbness.   Allergies  Review of patient's allergies indicates no known allergies.  Home Medications   Prior to Admission medications   Medication Sig Start Date End Date Taking? Authorizing Provider  docusate sodium (COLACE) 100 MG capsule Take 1 capsule (100 mg total) by mouth daily as needed for mild constipation. Patient not taking: Reported on  05/20/2014 11/26/13   Amparo Bristol, MD  erythromycin ophthalmic ointment Place 1 application into the right eye 4 (four) times daily. 05/20/14   [name], MD  ibuprofen (ADVIL,MOTRIN) 800 MG tablet Take 1 tablet (800 mg total) by mouth every 8 (eight) hours as needed for mild pain. Patient not taking: Reported on 05/20/2014 11/26/13   Amparo Bristol, MD  ibuprofen (ADVIL,MOTRIN) 800 MG tablet Take 1 tablet (800 mg total) by mouth 3 (three) times daily. 05/20/14   [name], MD  oxyCODONE-acetaminophen (PERCOCET/ROXICET) 5-325 MG per tablet Take 1 tablet by mouth every 6 (six) hours as needed for severe pain. Patient not taking: Reported on 05/20/2014 05/19/13   Delos Haring, PA-C  traMADol (ULTRAM) 50 MG tablet Take 1 tablet (50 mg total) by mouth every 6 (six) hours as needed. Patient not taking: Reported on 05/20/2014 04/11/14   [name], PA-C   Triage Vitals: BP 139/90 mmHg  Pulse 69  Temp(Src) 97.6 F (36.4 C) (Oral)  Resp 20  Ht 5\' 11"  (1.803 m)  Wt 200 lb (90.719 kg)  BMI 27.91 kg/m2  SpO2 97% Physical Exam  Constitutional: He is oriented to person, place, and time. He appears well-developed and well-nourished. No distress.  HENT:  Head: Normocephalic and atraumatic.  Eyes: Conjunctivae and EOM are normal.  Neck: Neck supple.  Cardiovascular: Normal rate, regular rhythm and normal heart sounds.   Pulmonary/Chest: Effort normal and breath sounds normal. No respiratory distress.  Musculoskeletal: Normal range of motion. He exhibits tenderness. He exhibits no  edema.  Tender to palpation in left lumbar paraspinal muscles, no overlying erythema or edema. Increased pain with ROM of the lower back.  Neurological: He is alert and oriented to person, place, and time.  5/5 muscle strength in LE bilaterally. 2+ patella reflexes bilaterally. 2+ dorsal pedis pulses bilaterally. Distal sensation of both feet intact.   Skin: Skin is warm and dry.  Psychiatric: He has a normal mood and  affect. His behavior is normal.  Nursing note and vitals reviewed.   ED Course  Procedures (including critical care time) DIAGNOSTIC STUDIES: Oxygen Saturation is 97% on RA, nl by my interpretation.    COORDINATION OF CARE: 10:54 PM-Discussed treatment plan which includes muscle relaxer, pain meds, f/u with PCP or ortho, ortho referral with pt at bedside and pt agreed to plan.   Labs Review Labs Reviewed - No data to display  Imaging Review No results found.   EKG Interpretation None      MDM   Final diagnoses:  None   Patient presents today    Hyman Bible, PA-C 07/05/14 2311  Pamella Pert, MD 07/06/14 1231

## 2014-08-17 ENCOUNTER — Encounter (HOSPITAL_COMMUNITY): Payer: Self-pay | Admitting: Emergency Medicine

## 2014-08-17 ENCOUNTER — Emergency Department (HOSPITAL_COMMUNITY)
Admission: EM | Admit: 2014-08-17 | Discharge: 2014-08-17 | Disposition: A | Discharge status: Home / Self Care | Payer: Self-pay | Attending: Emergency Medicine | Admitting: Emergency Medicine

## 2014-08-17 ENCOUNTER — Emergency Department (HOSPITAL_COMMUNITY): Payer: Self-pay

## 2014-08-17 DIAGNOSIS — M79673 Pain in unspecified foot: Secondary | ICD-10-CM | POA: Insufficient documentation

## 2014-08-17 DIAGNOSIS — Z79899 Other long term (current) drug therapy: Secondary | ICD-10-CM | POA: Insufficient documentation

## 2014-08-17 DIAGNOSIS — R600 Localized edema: Secondary | ICD-10-CM | POA: Insufficient documentation

## 2014-08-17 DIAGNOSIS — Z87891 Personal history of nicotine dependence: Secondary | ICD-10-CM | POA: Insufficient documentation

## 2014-08-17 DIAGNOSIS — Z792 Long term (current) use of antibiotics: Secondary | ICD-10-CM | POA: Insufficient documentation

## 2014-08-17 DIAGNOSIS — R0602 Shortness of breath: Secondary | ICD-10-CM | POA: Insufficient documentation

## 2014-08-17 DIAGNOSIS — I10 Essential (primary) hypertension: Secondary | ICD-10-CM | POA: Insufficient documentation

## 2014-08-17 DIAGNOSIS — R0601 Orthopnea: Secondary | ICD-10-CM | POA: Insufficient documentation

## 2014-08-17 LAB — CBC WITH DIFFERENTIAL/PLATELET
BASOS PCT: 0 % (ref 0–1)
Basophils Absolute: 0 10*3/uL (ref 0.0–0.1)
EOS ABS: 0.1 10*3/uL (ref 0.0–0.7)
Eosinophils Relative: 1 % (ref 0–5)
HEMATOCRIT: 41.1 % (ref 39.0–52.0)
HEMOGLOBIN: 13.9 g/dL (ref 13.0–17.0)
LYMPHS ABS: 1.8 10*3/uL (ref 0.7–4.0)
Lymphocytes Relative: 39 % (ref 12–46)
MCH: 31.4 pg (ref 26.0–34.0)
MCHC: 33.8 g/dL (ref 30.0–36.0)
MCV: 92.8 fL (ref 78.0–100.0)
Monocytes Absolute: 0.5 10*3/uL (ref 0.1–1.0)
Monocytes Relative: 10 % (ref 3–12)
Neutro Abs: 2.3 10*3/uL (ref 1.7–7.7)
Neutrophils Relative %: 49 % (ref 43–77)
PLATELETS: 61 10*3/uL — AB (ref 150–400)
RBC: 4.43 MIL/uL (ref 4.22–5.81)
RDW: 14.4 % (ref 11.5–15.5)
WBC: 4.7 10*3/uL (ref 4.0–10.5)

## 2014-08-17 LAB — COMPREHENSIVE METABOLIC PANEL
ALT: 51 U/L (ref 17–63)
AST: 105 U/L — ABNORMAL HIGH (ref 15–41)
Albumin: 3.4 g/dL — ABNORMAL LOW (ref 3.5–5.0)
Alkaline Phosphatase: 93 U/L (ref 38–126)
Anion gap: 5 (ref 5–15)
BILIRUBIN TOTAL: 1.7 mg/dL — AB (ref 0.3–1.2)
CALCIUM: 8.9 mg/dL (ref 8.9–10.3)
CO2: 29 mmol/L (ref 22–32)
Chloride: 106 mmol/L (ref 101–111)
Creatinine, Ser: 0.89 mg/dL (ref 0.61–1.24)
GFR calc Af Amer: >60 mL/min (ref >60)
Glucose, Bld: 92 mg/dL (ref 65–99)
Potassium: 3.9 mmol/L (ref 3.5–5.1)
SODIUM: 140 mmol/L (ref 135–145)
Total Protein: 7.1 g/dL (ref 6.5–8.1)

## 2014-08-17 LAB — BRAIN NATRIURETIC PEPTIDE: B NATRIURETIC PEPTIDE 5: 130.3 pg/mL — AB (ref 0.0–100.0)

## 2014-08-17 MED ORDER — FUROSEMIDE 10 MG/ML IJ SOLN
40.0000 mg | INTRAMUSCULAR | Status: AC
  Administered 2014-08-17: 40 mg via INTRAVENOUS
  Filled 2014-08-17: qty 4

## 2014-08-17 MED ORDER — FENTANYL CITRATE (PF) 100 MCG/2ML IJ SOLN
100.0000 ug | Freq: Once | INTRAMUSCULAR | Status: AC
  Administered 2014-08-17: 100 ug via INTRAVENOUS
  Filled 2014-08-17: qty 2

## 2014-08-17 MED ORDER — FUROSEMIDE 20 MG PO TABS: 20.0000 mg | ORAL_TABLET | Freq: Every day | ORAL | Status: DC

## 2014-08-17 MED ORDER — NAPROXEN 500 MG PO TABS: 500.0000 mg | ORAL_TABLET | Freq: Two times a day (BID) | ORAL | Status: DC

## 2014-08-17 NOTE — Discharge Instructions (Signed)
Please follow up with your primary care physician in 1-2 days. If you do not have one please call the [facility] number listed above. Please read all discharge instructions and return precautions.    Peripheral Edema You have swelling in your legs (peripheral edema). This swelling is due to excess accumulation of salt and water in your body. Edema may be a sign of heart, kidney or liver disease, or a side effect of a medication. It may also be due to problems in the leg veins. Elevating your legs and using special support stockings may be very helpful, if the cause of the swelling is due to poor venous circulation. Avoid long periods of standing, whatever the cause. Treatment of edema depends on identifying the cause. Chips, pretzels, pickles and other salty foods should be avoided. Restricting salt in your diet is almost always needed. Water pills (diuretics) are often used to remove the excess salt and water from your body via urine. These medicines prevent the kidney from reabsorbing sodium. This increases urine flow. Diuretic treatment may also result in lowering of potassium levels in your body. Potassium supplements may be needed if you have to use diuretics daily. Daily weights can help you keep track of your progress in clearing your edema. You should call your caregiver for follow up care as recommended. SEEK IMMEDIATE MEDICAL CARE IF:   You have increased swelling, pain, redness, or heat in your legs.  You develop shortness of breath, especially when lying down.  You develop chest or abdominal pain, weakness, or fainting.  You have a fever. Document Released: 02/17/2004 Document Revised: 04/03/2011 Document Reviewed: 01/27/2009 [facility] Patient Information 2015 [place], [place]. This information is not intended to replace advice given to you by your health care provider. Make sure you discuss any questions you have with your health care provider.

## 2014-08-17 NOTE — ED Notes (Signed)
Pt. Stated, I've had swollen feet for 4 days from my knees down for 4 days.

## 2014-08-17 NOTE — ED Provider Notes (Signed)
CSN: [id number]     Arrival date & time 08/17/14  1449 History   First MD Initiated Contact with Patient 08/17/14 1810     Chief Complaint  Patient presents with  . Foot Pain  . Leg Swelling     (Consider location/radiation/quality/duration/timing/severity/associated sxs/prior Treatment) HPI Comments: Patient is a 64 year old M PMHx significant for HTN presenting to the ED for evaluation of bilateral LE swelling. Patient states this has worsened over the last four days. He also notes he is now needing an additional pillow to sleep at night due to shortness of breath. No modifying or aggravating factors identified. Endorses 10/10 lower extremity pain. No trauma or injuries. No history of stress test, echocardiogram, or cardiac cathertization.   Patient is a 64 y.o. male presenting with lower extremity pain.  Foot Pain    Past Medical History  Diagnosis Date  . Hypertension   . Hernia    Past Surgical History  Procedure Laterality Date  . Pelvic fracture surgery     No family history on file. History  Substance Use Topics  . Smoking status: Former Research scientist (life sciences)  . Smokeless tobacco: Not on file  . Alcohol Use: Yes     Comment: ocassionally    Review of Systems  Respiratory:       + Orthopnea  Cardiovascular: Positive for leg swelling.  All other systems reviewed and are negative.     Allergies  Review of patient's allergies indicates no known allergies.  Home Medications   Prior to Admission medications   Medication Sig Start Date End Date Taking? Authorizing Provider  erythromycin ophthalmic ointment Place 1 application into the right eye 4 (four) times daily. 05/20/14  Yes [name], MD  HYDROcodone-acetaminophen (NORCO/VICODIN) 5-325 MG per tablet Take 1-2 tablets by mouth every 6 (six) hours as needed. 07/05/14  Yes Heather Laisure, PA-C  methocarbamol (ROBAXIN) 500 MG tablet Take 1 tablet (500 mg total) by mouth 2 (two) times daily. 07/05/14  Yes Heather Laisure,  PA-C  docusate sodium (COLACE) 100 MG capsule Take 1 capsule (100 mg total) by mouth daily as needed for mild constipation. Patient not taking: Reported on 05/20/2014 11/26/13   Amparo Bristol, MD  furosemide (LASIX) 20 MG tablet Take 1 tablet (20 mg total) by mouth daily. 08/17/14   Bonnee Zertuche, PA-C  ibuprofen (ADVIL,MOTRIN) 800 MG tablet Take 1 tablet (800 mg total) by mouth every 8 (eight) hours as needed for mild pain. Patient not taking: Reported on 05/20/2014 11/26/13   Amparo Bristol, MD  ibuprofen (ADVIL,MOTRIN) 800 MG tablet Take 1 tablet (800 mg total) by mouth 3 (three) times daily. Patient not taking: Reported on 08/17/2014 05/20/14   [name], MD  naproxen (NAPROSYN) 500 MG tablet Take 1 tablet (500 mg total) by mouth 2 (two) times daily. Patient not taking: Reported on 08/17/2014 07/05/14   Hyman Bible, PA-C  naproxen (NAPROSYN) 500 MG tablet Take 1 tablet (500 mg total) by mouth 2 (two) times daily with a meal. 08/17/14   Baron Sane, PA-C  oxyCODONE-acetaminophen (PERCOCET/ROXICET) 5-325 MG per tablet Take 1 tablet by mouth every 6 (six) hours as needed for severe pain. Patient not taking: Reported on 05/20/2014 05/19/13   Delos Haring, PA-C  traMADol (ULTRAM) 50 MG tablet Take 1 tablet (50 mg total) by mouth every 6 (six) hours as needed. Patient not taking: Reported on 05/20/2014 04/11/14   Hessie Diener Hess, PA-C   BP 165/91 mmHg  Pulse 89  Temp(Src) 98.6 F (37 C) (Oral)  Resp 20  SpO2 96% Physical Exam  Constitutional: He is oriented to person, place, and time. He appears well-developed and well-nourished.  HENT:  Head: Normocephalic and atraumatic.  Eyes: Conjunctivae are normal.  Neck: Neck supple.  Cardiovascular: Normal rate, regular rhythm, normal heart sounds and intact distal pulses.   Pulmonary/Chest: Effort normal and breath sounds normal.  Abdominal: Soft. There is no tenderness.  Musculoskeletal: Normal range of motion. He exhibits edema (2+  bilateral LE).  Neurological: He is alert and oriented to person, place, and time. Gait normal. GCS eye subscore is 4. GCS verbal subscore is 5. GCS motor subscore is 6.  Skin: Skin is warm and dry.  Nursing note and vitals reviewed.   ED Course  Procedures (including critical care time) Medications  fentaNYL (SUBLIMAZE) injection 100 mcg (100 mcg Intravenous Given 08/17/14 1943)  furosemide (LASIX) injection 40 mg (40 mg Intravenous Given 08/17/14 2114)    Labs Review Labs Reviewed  COMPREHENSIVE METABOLIC PANEL - Abnormal; Notable for the following:    BUN <5 (*)    Albumin 3.4 (*)    AST 105 (*)    Total Bilirubin 1.7 (*)    All other components within normal limits  BRAIN NATRIURETIC PEPTIDE - Abnormal; Notable for the following:    B Natriuretic Peptide 130.3 (*)    All other components within normal limits  CBC WITH DIFFERENTIAL/PLATELET - Abnormal; Notable for the following:    Platelets 61 (*)    All other components within normal limits    Imaging Review Dg Chest 2 View  08/17/2014   CLINICAL DATA:  Bilateral leg swelling.  Hypertension.  EXAM: CHEST  2 VIEW  COMPARISON:  12/11/2011  FINDINGS: Lungs are adequately inflated without consolidation or effusion. Cardiomediastinal silhouette is within normal. There is minimal degenerative change of the spine.  IMPRESSION: No active cardiopulmonary disease.   Electronically Signed   By: Marin Olp M.D.   On: 08/17/2014 20:06     EKG Interpretation   Date/Time:  Monday August 17 2014 19:20:10 EDT Ventricular Rate:  52 PR Interval:  142 QRS Duration: 106 QT Interval:  494 QTC Calculation: 459 R Axis:   46 Text Interpretation:  Sinus rhythm Sinus bradycardia Borderline ECG  Confirmed by Carmin Muskrat  MD ([id number]) on 08/17/2014 8:59:34 PM      MDM   Final diagnoses:  Bilateral lower extremity edema    Filed Vitals:   08/17/14 2130  BP: 165/91  Pulse: 89  Temp:   Resp:    I have reviewed nursing notes, vital  signs, and all lab and all imaging results as noted above.  Patient with new orthopnea and bilateral LE edema. Neurovascularly intact. Normal sensation. No evidence of compartment syndrome. No signs of respiratory distress. Lungs clear to auscultation bilaterally. No hypoxia. EKG reviewed w/o acute abnormality. BNP slightly elevated, no evidence of cardiomegaly or intravascular edema noted on CXR. Labs otherwise without acute abnormality. Will give dose of lasix and d/c home on lasix with advised PCP f/u and lab recheck. Return precautions discussed. Patient is agreeable to plan. Patient is stable at time of discharge     Baron Sane, PA-C 08/18/14 0012  Carmin Muskrat, MD 08/18/14 Laureen Abrahams

## 2014-09-14 ENCOUNTER — Encounter (HOSPITAL_COMMUNITY): Payer: Self-pay

## 2014-09-14 ENCOUNTER — Emergency Department (HOSPITAL_COMMUNITY)
Admission: EM | Admit: 2014-09-14 | Discharge: 2014-09-14 | Disposition: A | Discharge status: Home / Self Care | Payer: Self-pay | Attending: Emergency Medicine | Admitting: Emergency Medicine

## 2014-09-14 ENCOUNTER — Emergency Department (HOSPITAL_COMMUNITY): Payer: Self-pay

## 2014-09-14 DIAGNOSIS — K2920 Alcoholic gastritis without bleeding: Secondary | ICD-10-CM | POA: Insufficient documentation

## 2014-09-14 DIAGNOSIS — R079 Chest pain, unspecified: Secondary | ICD-10-CM | POA: Insufficient documentation

## 2014-09-14 DIAGNOSIS — I10 Essential (primary) hypertension: Secondary | ICD-10-CM | POA: Insufficient documentation

## 2014-09-14 DIAGNOSIS — Z87891 Personal history of nicotine dependence: Secondary | ICD-10-CM | POA: Insufficient documentation

## 2014-09-14 LAB — CBC
HCT: 39.7 % (ref 39.0–52.0)
Hemoglobin: 13.2 g/dL (ref 13.0–17.0)
MCH: 30.8 pg (ref 26.0–34.0)
MCHC: 33.2 g/dL (ref 30.0–36.0)
MCV: 92.5 fL (ref 78.0–100.0)
Platelets: 85 10*3/uL — ABNORMAL LOW (ref 150–400)
RBC: 4.29 MIL/uL (ref 4.22–5.81)
RDW: 14.6 % (ref 11.5–15.5)
WBC: 6.5 10*3/uL (ref 4.0–10.5)

## 2014-09-14 LAB — COMPREHENSIVE METABOLIC PANEL
ALT: 46 U/L (ref 17–63)
ANION GAP: 11 (ref 5–15)
AST: 82 U/L — ABNORMAL HIGH (ref 15–41)
Albumin: 3.1 g/dL — ABNORMAL LOW (ref 3.5–5.0)
Alkaline Phosphatase: 92 U/L (ref 38–126)
BUN: <5 mg/dL — ABNORMAL LOW (ref 6–20)
CALCIUM: 8.7 mg/dL — AB (ref 8.9–10.3)
CHLORIDE: 107 mmol/L (ref 101–111)
CO2: 21 mmol/L — AB (ref 22–32)
CREATININE: 0.89 mg/dL (ref 0.61–1.24)
Glucose, Bld: 97 mg/dL (ref 65–99)
Potassium: 4.1 mmol/L (ref 3.5–5.1)
SODIUM: 139 mmol/L (ref 135–145)
Total Bilirubin: 0.7 mg/dL (ref 0.3–1.2)
Total Protein: 6.8 g/dL (ref 6.5–8.1)

## 2014-09-14 LAB — I-STAT TROPONIN, ED: TROPONIN I, POC: 0 ng/mL (ref 0.00–0.08)

## 2014-09-14 LAB — LIPASE, BLOOD: LIPASE: 52 U/L — AB (ref 22–51)

## 2014-09-14 MED ORDER — HYDROMORPHONE HCL 1 MG/ML IJ SOLN
1.0000 mg | Freq: Once | INTRAMUSCULAR | Status: AC
  Administered 2014-09-14: 1 mg via INTRAVENOUS
  Filled 2014-09-14: qty 1

## 2014-09-14 MED ORDER — SODIUM CHLORIDE 0.9 % IV BOLUS (SEPSIS)
1000.0000 mL | Freq: Once | INTRAVENOUS | Status: AC
  Administered 2014-09-14: 1000 mL via INTRAVENOUS

## 2014-09-14 MED ORDER — GI COCKTAIL ~~LOC~~
30.0000 mL | Freq: Once | ORAL | Status: AC
  Administered 2014-09-14: 30 mL via ORAL
  Filled 2014-09-14: qty 30

## 2014-09-14 MED ORDER — HYDROCODONE-ACETAMINOPHEN 5-325 MG PO TABS
2.0000 | ORAL_TABLET | ORAL | Status: DC | PRN
Start: 2014-09-14 — End: 2014-12-07

## 2014-09-14 NOTE — ED Notes (Signed)
Pt states he has been having generalized chest pain since Friday that is intermittent, sharp in nature and abd pain. No nausea or vomiting.

## 2014-09-14 NOTE — ED Notes (Signed)
Dr. Hanley Ben at the bedside.

## 2014-09-14 NOTE — ED Notes (Signed)
Called [facility] to inquire if blood sample available for troponin order.

## 2014-09-14 NOTE — ED Notes (Signed)
MD at bedside. 

## 2014-09-14 NOTE — ED Provider Notes (Signed)
History   Chief Complaint  Patient presents with  . Chest Pain  . Abdominal Pain    HPI 64 year old male with past medical history of hypertension, ventral hernia, alcoholism presents to ED for evaluation of epigastric pain. Patient states this is a problem that has been ongoing for several months. He states it has recently become worse and he also has had associated nausea and vomiting. Patient denies history of pancreatitis, gallstones. He states his pain radiates into his chest and back. He does report having some mild shortness of breath when pain is severe as it is now but otherwise does not have shortness of breath, diaphoresis. Pain is made worse with palpation over epigastrium. Patient denies fevers, chills, itching, jaundice. He does report having some bilateral lower extremity edema for the last several months. Denies any leg pain. Patient states he drinks a sixpack a day of beer. No other complaints at this time.  Onset of symptoms: gradual. Duration 1 wk; constant, no waxing/waning. Modifying factors pressure worsens.  Severity: 10/10.  Associated symptoms: as above.Marland Kitchen  Hx of similar symptoms: yes, several times last yr.    Past medical/surgical history, social history, medications, allergies and FH have been reviewed with patient and/or in documentation. Furthermore, if pt family or friend(s) present, additional historical information was obtained from them.  Past Medical History  Diagnosis Date  . Hypertension   . Hernia    Past Surgical History  Procedure Laterality Date  . Pelvic fracture surgery     No family history on file. Social History  Substance Use Topics  . Smoking status: Former Research scientist (life sciences)  . Smokeless tobacco: None  . Alcohol Use: Yes     Comment: ocassionally     Review of Systems Constitutional: - F/C, -fatigue.  HENT: - congestion, -rhinorrhea, -sore throat.   Eyes: - eye pain, -visual disturbance.  Respiratory: - cough, +SOB, -hemoptysis.    Cardiovascular: - CP, -palps.  Gastrointestinal: + N/V, abd pain  Genitourinary: - flank pain, -dysuria, -frequency.  Musculoskeletal: - myalgia/arthritis, -joint swelling, -gait abnormality, -back pain, -neck pain/stiffness, -leg pain/swelling.  Skin: - rash/lesion.  Neurological: - focal weakness, -lightheadedness, -dizziness, -numbness, -HA.  All other systems reviewed and are negative.   Physical Exam  Physical Exam  ED Triage Vitals  Enc Vitals Group     BP 09/14/14 2112 161/95 mmHg     Pulse Rate 09/14/14 2112 61     Resp 09/14/14 2112 18     Temp 09/14/14 2112 97.8 F (36.6 C)     Temp Source 09/14/14 2112 Oral     SpO2 09/14/14 2112 98 %     Weight 09/14/14 2112 195 lb (88.451 kg)     Height 09/14/14 2112 5\' 11"  (1.803 m)     Head Cir --      Peak Flow --      Pain Score 09/14/14 2113 10     Pain Loc --      Pain Edu? --      Excl. in [place]? --    Constitutional: Patient is well appearing, well hydrated 64 yo male and in no acute distress Head: Normocephalic and atraumatic.  Eyes: Extraocular motion intact, no scleral icterus Mouth: MMM, OP clear Neck: Supple without meningismus, mass, or overt JVD Respiratory: No respiratory distress. Normal WOB. No w/r/g. CV: RRR, no obvious murmurs.  Pulses +2 and symmetric. Euvolemic Abdomen: Soft, mild TTP in epig region, ND, no r/g. No mass. Neg murpheys. MSK: Extremities are atraumatic without  deformity, ROM intact Skin: Warm, dry, intact without rash. No jaundice Neuro: AAOx4, MAE 5/5 sym, no focal deficit noted   ED Course  Procedures   Labs Reviewed  CBC - Abnormal; Notable for the following:    Platelets 85 (*)    All other components within normal limits  LIPASE, BLOOD - Abnormal; Notable for the following:    Lipase 52 (*)    All other components within normal limits  COMPREHENSIVE METABOLIC PANEL - Abnormal; Notable for the following:    CO2 21 (*)    BUN <5 (*)    Calcium 8.7 (*)    Albumin 3.1 (*)     AST 82 (*)    All other components within normal limits  I-STAT TROPOININ, ED   I personally reviewed and interpreted all labs.  Dg Chest 2 View  09/14/2014   CLINICAL DATA:  Chest pain tonight starting in the abdomen and radiating into the chest and right shoulder.  EXAM: CHEST  2 VIEW  COMPARISON:  08/17/2014  FINDINGS: The heart size and mediastinal contours are within normal limits. Both lungs are clear. The visualized skeletal structures are unremarkable.  IMPRESSION: No active cardiopulmonary disease.   Electronically Signed   By: Lucienne Capers M.D.   On: 09/14/2014 21:48   I personally viewed above image(s) which were used in my medical decision making. Formal interpretations by Radiology.   EKG Interpretation  Date/Time:  Monday September 14 2014 21:13:01 EDT Ventricular Rate:  57 PR Interval:  112 QRS Duration: 98 QT Interval:  462 QTC Calculation: 449 R Axis:   31 Text Interpretation:  Sinus bradycardia Left ventricular hypertrophy with repolarization abnormality Abnormal ECG q wave lead 3 is new No significant change since last tracing otherwise Confirmed by Kathrynn Humble, MD, Thelma Comp [phone]) on 09/14/2014 9:14:49 PM       MDM: [name] is a 64 y.o. male with H&P as above who p/w CC: epig pain  On arrival, patient is hemodynamically stable and in no apparent distress. He is a benign exam as above with only mild epigastric tenderness to palpation and negative Murphy's sign. Given patient's history of alcoholism, I feel patient is likely having alcoholic gastritis versus mild pancreatitis. Screening EKG was also sent given location of patient's discomfort in the setting of nausea, vomiting, shortness of breath. He does have a low risk here score and pain has been constant for the past several days without waxing or waning and a single troponin rule out is appropriate in this circumstance. Screening EKG is as above and shows sinus bradycardia, LVH, and new Q wave in lead 3 and is  otherwise unchanged from prior EKG without signs of acute ischemia. Screening labs were sent and are notable for slightly elevated lipase and otherwise unremarkable labs. Additionally, chest x-ray is unremarkable at this time. Patient was given IV fluids, pain medicine, GI cocktail. I had a lengthy discussion with patient regarding his alcohol use and advised he start at first to cut back.  -Results: Troponin is undetectable today. -Re-evaluation: Patient reports he is feeling much better now.  Patient stable for discharge home. I provided him a short course of pain medicine at home with. I also advised him to try Maalox over-the-counter as well for his symptoms. I advised him to follow closely with his PCP and cut back on his alcohol use.   Old records reviewed (if available). Labs and imaging reviewed personally by myself and considered in medical decision making if ordered.  Clinical Impression: 1. Alcoholic gastritis     Disposition: Discharge  Condition: Good  I have discussed the results, Dx and Tx plan with the pt(& family if present). He/she/they expressed understanding and agree(s) with the plan. Discharge instructions discussed at great length. Strict return precautions discussed and pt &/or family have verbalized understanding of the instructions. No further questions at time of discharge.    New Prescriptions   HYDROCODONE-ACETAMINOPHEN (NORCO/VICODIN) 5-325 MG PER TABLET    Take 2 tablets by mouth every 4 (four) hours as needed.    Follow Up: [place]     [address] [place] [address] [phone]  Follow up with your primary doctor in 1 week or sooner if not improved in 2 days;To establish primary care, call above  [facility] [address] [id number] [place] [phone]  If symptoms worsen   Pt seen in conjunction with Dr.  Orpah Greek, MD  Kirstie Peri, [facility] Resident - PGY-3   Kirstie Peri, MD 09/14/14 [place], MD 09/15/14 [phone]

## 2014-09-14 NOTE — Discharge Instructions (Signed)
Gastritis, Adult °Gastritis is soreness and puffiness (inflammation) of the lining of the stomach. If you do not get help, gastritis can cause bleeding and sores (ulcers) in the stomach. °HOME CARE  °· Only take medicine as told by your doctor. °· If you were given antibiotic medicines, take them as told. Finish the medicines even if you start to feel better. °· Drink enough fluids to keep your pee (urine) clear or pale yellow. °· Avoid foods and drinks that make your problems worse. Foods you may want to avoid include: °¨ Caffeine or alcohol. °¨ Chocolate. °¨ Mint. °¨ Garlic and onions. °¨ Spicy foods. °¨ Citrus fruits, including oranges, lemons, or limes. °¨ Food containing tomatoes, including sauce, chili, salsa, and pizza. °¨ Fried and fatty foods. °· Eat small meals throughout the day instead of large meals. °GET HELP RIGHT AWAY IF:  °· You have black or dark red poop (stools). °· You throw up (vomit) blood. It may look like coffee grounds. °· You cannot keep fluids down. °· Your belly (abdominal) pain gets worse. °· You have a fever. °· You do not feel better after 1 week. °· You have any other questions or concerns. °MAKE SURE YOU:  °· Understand these instructions. °· Will watch your condition. °· Will get help right away if you are not doing well or get worse. °Document Released: 06/28/2007 Document Revised: 04/03/2011 Document Reviewed: 02/22/2011 °ExitCare® Patient Information ©2015 [employer]. This information is not intended to replace advice given to you by your health care provider. Make sure you discuss any questions you have with your health care provider. ° °

## 2014-09-14 NOTE — ED Notes (Signed)
Pt. States unable to void at the moment.

## 2014-11-22 ENCOUNTER — Encounter (HOSPITAL_COMMUNITY): Payer: Self-pay | Admitting: *Deleted

## 2014-11-22 ENCOUNTER — Emergency Department (HOSPITAL_COMMUNITY): Payer: Self-pay

## 2014-11-22 ENCOUNTER — Emergency Department (HOSPITAL_COMMUNITY)
Admission: EM | Admit: 2014-11-22 | Discharge: 2014-11-22 | Disposition: A | Discharge status: Home / Self Care | Payer: Self-pay | Attending: Emergency Medicine | Admitting: Emergency Medicine

## 2014-11-22 DIAGNOSIS — I10 Essential (primary) hypertension: Secondary | ICD-10-CM | POA: Insufficient documentation

## 2014-11-22 DIAGNOSIS — M778 Other enthesopathies, not elsewhere classified: Secondary | ICD-10-CM

## 2014-11-22 DIAGNOSIS — Z8719 Personal history of other diseases of the digestive system: Secondary | ICD-10-CM | POA: Insufficient documentation

## 2014-11-22 DIAGNOSIS — M25522 Pain in left elbow: Secondary | ICD-10-CM

## 2014-11-22 DIAGNOSIS — Z87891 Personal history of nicotine dependence: Secondary | ICD-10-CM | POA: Insufficient documentation

## 2014-11-22 MED ORDER — MELOXICAM 7.5 MG PO TABS: 7.5000 mg | ORAL_TABLET | Freq: Every day | ORAL | Status: DC | PRN

## 2014-11-22 MED ORDER — MELOXICAM 7.5 MG PO TABS
7.5000 mg | ORAL_TABLET | Freq: Once | ORAL | Status: AC
  Administered 2014-11-22: 7.5 mg via ORAL
  Filled 2014-11-22: qty 1

## 2014-11-22 NOTE — ED Notes (Signed)
Declined W/C at D/C and was escorted to lobby by RN. 

## 2014-11-22 NOTE — ED Provider Notes (Signed)
CSN: [id number]     Arrival date & time 11/22/14  8756 History  By signing my name below, I, [name], attest that this documentation has been prepared under the direction and in the presence of [place], PA-C.  Electronically Signed: [name], ED Scribe. 11/22/2014. 11:17 AM.    Chief Complaint  Patient presents with  . Elbow Pain   The history is provided by the patient. No language interpreter was used.   HPI Comments: [name] is a 64 y.o. male who presents to the Emergency Department complaining of constant, moderate pain left elbow onset two weeks ago; worse with extension; no treatments tried.   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 Pt denies injury, fever, numbness/weakness in left hand, neck pain, back pain.  Past Medical History  Diagnosis Date  . Hypertension   . Hernia    Past Surgical History  Procedure Laterality Date  . Pelvic fracture surgery     History reviewed. No pertinent family history. Social History  Substance Use Topics  . Smoking status: Former Research scientist (life sciences)  . Smokeless tobacco: None  . Alcohol Use: Yes     Comment: ocassionally    Review of Systems  Constitutional: Negative for fever.  Musculoskeletal: Positive for arthralgias. Negative for back pain and neck pain.  Skin: Negative for color change, rash and wound.  Allergic/Immunologic: Negative for immunocompromised state.  Neurological: Negative for weakness and numbness.  Hematological: Does not bruise/bleed easily.  Psychiatric/Behavioral: Negative for self-injury.      Allergies  Review of patient's allergies indicates no known allergies.  Home Medications   Prior to Admission medications   Medication Sig Start Date End Date Taking? Authorizing Provider  docusate sodium (COLACE) 100 MG capsule Take 1 capsule (100 mg total) by mouth daily as needed for mild constipation. Patient not taking: Reported on  05/20/2014 11/26/13   Amparo Bristol, MD  furosemide (LASIX) 20 MG tablet Take 1 tablet (20 mg total) by mouth daily. Patient not taking: Reported on 09/14/2014 08/17/14   Baron Sane, PA-C  HYDROcodone-acetaminophen (NORCO/VICODIN) 5-325 MG per tablet Take 2 tablets by mouth every 4 (four) hours as needed. 09/14/14   Kirstie Peri, MD  ibuprofen (ADVIL,MOTRIN) 800 MG tablet Take 1 tablet (800 mg total) by mouth every 8 (eight) hours as needed for mild pain. Patient not taking: Reported on 05/20/2014 11/26/13   Amparo Bristol, MD  ibuprofen (ADVIL,MOTRIN) 800 MG tablet Take 1 tablet (800 mg total) by mouth 3 (three) times daily. Patient not taking: Reported on 08/17/2014 05/20/14   [name], MD  methocarbamol (ROBAXIN) 500 MG tablet Take 1 tablet (500 mg total) by mouth 2 (two) times daily. Patient not taking: Reported on 09/14/2014 07/05/14   Hyman Bible, PA-C  naproxen (NAPROSYN) 500 MG tablet Take 1 tablet (500 mg total) by mouth 2 (two) times daily. Patient not taking: Reported on 08/17/2014 07/05/14   Hyman Bible, PA-C  naproxen (NAPROSYN) 500 MG tablet Take 1 tablet (500 mg total) by mouth 2 (two) times daily with a meal. Patient not taking: Reported on 09/14/2014 08/17/14   Anderson Malta Piepenbrink, PA-C   BP 155/93 mmHg  Pulse 61  Temp(Src) 98 F (36.7 C) (Oral)  Resp 18  SpO2 98% Physical Exam  Constitutional: He appears well-developed and well-nourished. No distress.  HENT:  Head: Normocephalic and atraumatic.  Neck: Neck supple.  Pulmonary/Chest: Effort normal.  Musculoskeletal:  Left elbow with full active  ROM  No Erythema, edema, warmth, TTP over left epicondyle TTP over proximal muscle group of dorsal forearm Pain with extension of the wrist Left arm with 5/5 strength thought, sensation intact, distal pulses intact.  Neurological: He is alert.  Skin: He is not diaphoretic.  Nursing note and vitals reviewed.   ED Course  Procedures DIAGNOSTIC STUDIES: Oxygen  Saturation is 98% on RA, normal by my interpretation.    10:14 AM COORDINATION OF CARE: Discussed treatment plan which includes imaging of elbow and Mobic.  Patient agrees with plan.  Labs Review Labs Reviewed - No data to display  Imaging Review No results found. I have personally reviewed and evaluated these images and lab results as part of my medical decision-making.   EKG Interpretation None      MDM   Final diagnoses:  Left elbow pain  Left elbow tendonitis    Afebrile, nontoxic patient with 2 weeks of left elbow pain, consistent with lateral epicondylitis.  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  Neurovascularly intact.  Xray negative.   D/C home with mobic.  PCP follow up.  Discussed result, findings, treatment, and follow up  with patient.  Pt given return precautions.  Pt verbalizes understanding and agrees with plan.        I personally performed the services described in this documentation, which was scribed in my presence. The recorded information has been reviewed and is accurate.   [place], PA-C 11/22/14 [place] Liu, MD 11/22/14 [phone]

## 2014-11-22 NOTE — Discharge Instructions (Signed)
Read the information below.  Use the prescribed medication as directed.  Please discuss all new medications with your pharmacist.  You may return to the Emergency Department at any time for worsening condition or any new symptoms that concern you.  If you develop uncontrolled pain, weakness or numbness of the extremity, severe discoloration of the skin, or you are unable to move your elbow, return to the ER for a recheck.      Emergency Department Resource Guide 1) Find a Doctor and Pay Out of Pocket Although you won't have to find out who is covered by your insurance plan, it is a good idea to ask around and get recommendations. You will then need to call the office and see if the doctor you have chosen will accept you as a new patient and what types of options they offer for patients who are self-pay. Some doctors offer discounts or will set up payment plans for their patients who do not have insurance, but you will need to ask so you aren't surprised when you get to your appointment.  2) Contact Your Local Health Department Not all health departments have doctors that can see patients for sick visits, but many do, so it is worth a call to see if yours does. If you don't know where your local health department is, you can check in your phone book. The CDC also has a tool to help you locate your state's health department, and many state websites also have listings of all of their local health departments.  3) Find a [facility] If your illness is not likely to be very severe or complicated, you may want to try a walk in clinic. These are popping up all over the country in pharmacies, drugstores, and shopping centers. They're usually staffed by nurse practitioners or physician assistants that have been trained to treat common illnesses and complaints. They're usually fairly quick and inexpensive. However, if you have serious medical issues or chronic medical problems, these are probably not your best  option.  No Primary Care Doctor: - Call Health Connect at  [phone] - they can help you locate a primary care doctor that  accepts your insurance, provides certain services, etc. - Physician Referral Service- [phone]  Chronic Pain Problems: Organization         Address  Phone   Notes  [facility]  [phone] Patients need to be referred by their primary care doctor.   Medication Assistance: Organization         Address  Phone   Notes  [facility]ication [place]  [address] [place], [place] [address] [phone] --Must be a resident of [facility] -- Must have NO insurance coverage whatsoever (no Medicaid/ Medicare, etc.) -- The pt. MUST have a primary care doctor that directs their care regularly and follows them in the community   MedAssist  [phone]   [employer]  [phone]    Agencies that provide inexpensive medical care: Organization         Address  Phone   Notes  [place]  [phone]   [facility]    [phone]   [facility] [place], [place] [address] [phone]   [place] [address] [place] [phone]   Planned Parenthood    [phone]   [facility]    [phone]  [facility]  [address], [place] Phone:  [phone], Fax:  [phone] Hours of Operation:  9 am - 6 pm, M-F.  Also accepts Medicaid/Medicare and self-pay.  [facility] for [place] [address], [place] Phone: [phone], Fax: [phone]. Hours of Operation:  8:30 am - 5:30 pm, M-F.  Also accepts Medicaid and self-pay.  [facility] [place] [address] [place] Phone: [phone]   [place], [place], [place] [phone], Ext. [phone] Mondays & Thursdays: 7-9 AM.  First 15  patients are seen on a first come, first serve basis.    [place] Providers:  Organization         Address  Phone   Notes  Dolorez Jeffrey [facility] [address],  [phone] Also accepts self-pay patients.  [facility] [address], [place]  [phone]   [place], [address], [place] [phone]   [facility] [address], [place] [phone]   Lucianne Lei [address], [place]   [phone] Only accepts [place] Access [place] patients after they have their name applied to their card.   Self-Pay (no insurance) in [facility]:  Organization         Address  Phone   Notes  Sickle Cell Patients, [facility] Internal Medicine [place] [phone]   [facility] [place] [phone]   [facility] [place]  [place], [place], [place] [phone]   [facility]/Dr. Osei-Bonsu  [address] [place] or [address], [place] [phone] Phone number for both [place] and [place] locations is the same.  Urgent Medical and [facility] [address], [place] [phone]   [facility] [address], [place] or [address] [phone]   [facility] [address], [place] [phone], phone; [phone], fax Sees patients 1st and 3rd Saturday of every month.  Must not qualify for public or private insurance (i.e. Medicaid, Medicare, [place] Health Choice, Veterans' Benefits)  Household income should be no more than 200% of the poverty level The clinic cannot treat you if you are pregnant or think you are pregnant  Sexually transmitted diseases are not treated at the clinic.    Dental  Care: Organization         Address  Phone  Notes  [facility] Department of [facility] Regino Ramirez [phone] Accepts children up to age 62 who are enrolled in [place] or [place]; pregnant women with a Medicaid card; and children who have applied for Medicaid or [place] Health Choice, but were declined, whose parents can pay a reduced fee at time of service.  [facility] Department of [facility]  [address], [place] [phone] Accepts children up to age 71 who are enrolled in [place] or [place]; pregnant women with a Medicaid card; and children who have applied for Medicaid or [place] Health Choice, but were declined, whose parents can pay a reduced fee at time of service.  [place]  [phone]  [place] [phone] Patients are seen by appointment only. Walk-ins are not accepted. [place] will see patients 26 years of age and older. Monday - Tuesday (8am-5pm) Most Wednesdays (8:30-5pm) $30 per visit, cash only  [facility] Adult Dental Access PROGRAM  [address], [facility] [phone] Patients are seen by appointment only. Walk-ins are not accepted. [place] will see patients 62 years of age and older. One Wednesday Evening (Monthly: Volunteer Based).  $30 per visit, cash only  [place]  [phone] for adults; Children under age 39, call Graduate Pediatric Dentistry at [phone]. Children aged 75-14, please call [phone] to request a pediatric application.  Dental services are provided in all areas of dental care including fillings, crowns and bridges, complete and partial dentures, implants, gum treatment, root canals, and extractions. Preventive care is also provided. Treatment is provided to both adults and children. Patients are selected via a lottery and there is often a waiting list.   [facility] [address] [place]  [phone] [email or web address]   [facility] [address] [place], [place] [phone], Ext. [phone] Second and Fourth Thursday of each month, opens at 6:30 AM; Clinic ends at 9 AM.  Patients are seen on a first-come first-served basis, and a limited number are seen during each clinic.   [facility]  [address] Hillard Danker [place], [place] [phone]   Eligibility Requirements You must have lived in [place], [place], or [place] counties for at least the last three months.   You cannot be eligible for state or federal sponsored [employer], including [employer], [place], or [employer].   You generally cannot be eligible for healthcare insurance through your employer.    How to apply: Eligibility screenings are held every Tuesday and Wednesday afternoon from 1:00 pm until 4:00 pm. You do not need an appointment for the interview!  [facility] [address] [place], [place]   [place]  [place] Department  [place]  [phone]    Behavioral Health Resources in the Community: Intensive Outpatient Programs Organization         Address  Phone  Notes  [place]. [address] [place], [place] [phone]   [facility] Outpatient [address] [place], [place]   ADS: Alcohol & Drug Svcs [address], [place], [place]   [place] [address]  [place], [place] or [phone]   Substance Abuse Resources Organization         Address  Phone  Notes  Alcohol and Drug Services  [phone]   [place]  [phone]   [facility] [place]   Chinita Pester  [phone]   Residential & Outpatient Substance Abuse Program  [phone]    Psychological Services Organization         Address  Phone  Notes  [facility] [place]  [place]  [phone]   [place] [address] [place] or [phone]    Mobile Crisis Teams Organization         Address  Phone  Notes  [facility]atives, Mobile Crisis Care Unit  [phone]   Assertive Psychotherapeutic Services  [address] [place], [place]  [facility] [address], [address] [place] [phone]    Self-Help/Support Groups Organization         Address  Phone             Notes  [facility]ental [facility] of [place] - variety of support groups  Jessie Call for more information  Narcotics Anonymous (NA), [facility] [address], [employer]  2 meetings at this location   Special educational needs teacher         Address  Phone  Notes  ASAP Residential Treatment [place],    [place]  [phone]   [facility]  [address], [place] [id number], [place], [place]   [place], [place] [phone] Admissions: 8am-3pm M-F  Incentives Substance [place] [address]    [place], [place] [phone]   [facility] [address] [place], [place], [place]   [facility] [address]  [place], [place]   [facility]ight Programs - Intensive Outpatient [address] Kristeen [address], [place], [place]   [facility] [place].,  [place], [place] [phone] or [phone]   [facility]idential Treatment Services (RTS) [address] [place], [place] Accepts Medicaid  [facility] [place] [address]  [place] [phone] Substance Abuse/Addiction Treatment   [facility] Organization         Address  Phone  Notes  [facility]  [phone]   Domenic Schwab, PhD [address] [place], [place]   [phone] or [phone]   [place], [place] [phone]   [facility] [address] [place], [place] [phone] Insurance/Medicaid/sponsorship through [facility] and Families [address], [address] [place]                                    [place], [place] [phone]  [place] [address], [place] [phone]    Dr. Adele Schilder  [phone]   [facility] Dept. 1) [address], [place] 2) [place] 3)  [address], [place] [phone]  [phone]   [place] [phone] or [phone] (After Hours)

## 2014-11-22 NOTE — ED Notes (Signed)
Pt reports left elbow pain x 2 weeks, denies injury. Pain increases with movement.

## 2014-12-07 ENCOUNTER — Emergency Department (HOSPITAL_COMMUNITY): Payer: Self-pay

## 2014-12-07 ENCOUNTER — Encounter (HOSPITAL_COMMUNITY): Payer: Self-pay | Admitting: Emergency Medicine

## 2014-12-07 ENCOUNTER — Emergency Department (HOSPITAL_COMMUNITY)
Admission: EM | Admit: 2014-12-07 | Discharge: 2014-12-07 | Disposition: A | Discharge status: Home / Self Care | Payer: Self-pay | Attending: Emergency Medicine | Admitting: Emergency Medicine

## 2014-12-07 DIAGNOSIS — K600 Acute anal fissure: Secondary | ICD-10-CM | POA: Insufficient documentation

## 2014-12-07 DIAGNOSIS — R1084 Generalized abdominal pain: Secondary | ICD-10-CM

## 2014-12-07 DIAGNOSIS — K649 Unspecified hemorrhoids: Secondary | ICD-10-CM | POA: Insufficient documentation

## 2014-12-07 DIAGNOSIS — K7030 Alcoholic cirrhosis of liver without ascites: Secondary | ICD-10-CM | POA: Insufficient documentation

## 2014-12-07 DIAGNOSIS — K7460 Unspecified cirrhosis of liver: Secondary | ICD-10-CM

## 2014-12-07 DIAGNOSIS — I10 Essential (primary) hypertension: Secondary | ICD-10-CM | POA: Insufficient documentation

## 2014-12-07 DIAGNOSIS — Z87891 Personal history of nicotine dependence: Secondary | ICD-10-CM | POA: Insufficient documentation

## 2014-12-07 DIAGNOSIS — K625 Hemorrhage of anus and rectum: Secondary | ICD-10-CM | POA: Insufficient documentation

## 2014-12-07 DIAGNOSIS — K602 Anal fissure, unspecified: Secondary | ICD-10-CM

## 2014-12-07 LAB — POC OCCULT BLOOD, ED: Fecal Occult Bld: POSITIVE — AB

## 2014-12-07 LAB — URINALYSIS, ROUTINE W REFLEX MICROSCOPIC
BILIRUBIN URINE: NEGATIVE
GLUCOSE, UA: NEGATIVE mg/dL
HGB URINE DIPSTICK: NEGATIVE
KETONES UR: NEGATIVE mg/dL
Leukocytes, UA: NEGATIVE
Nitrite: NEGATIVE
PROTEIN: NEGATIVE mg/dL
Specific Gravity, Urine: 1.007 (ref 1.005–1.030)
UROBILINOGEN UA: 1 mg/dL (ref 0.0–1.0)
pH: 7.5 (ref 5.0–8.0)

## 2014-12-07 LAB — COMPREHENSIVE METABOLIC PANEL
ALBUMIN: 2.9 g/dL — AB (ref 3.5–5.0)
ALT: 49 U/L (ref 17–63)
ANION GAP: 7 (ref 5–15)
AST: 83 U/L — ABNORMAL HIGH (ref 15–41)
Alkaline Phosphatase: 88 U/L (ref 38–126)
BILIRUBIN TOTAL: 0.9 mg/dL (ref 0.3–1.2)
BUN: 7 mg/dL (ref 6–20)
CO2: 26 mmol/L (ref 22–32)
Calcium: 8.7 mg/dL — ABNORMAL LOW (ref 8.9–10.3)
Chloride: 105 mmol/L (ref 101–111)
Creatinine, Ser: 0.95 mg/dL (ref 0.61–1.24)
GFR calc non Af Amer: >60 mL/min (ref >60)
GLUCOSE: 148 mg/dL — AB (ref 65–99)
POTASSIUM: 4.3 mmol/L (ref 3.5–5.1)
SODIUM: 138 mmol/L (ref 135–145)
TOTAL PROTEIN: 6.2 g/dL — AB (ref 6.5–8.1)

## 2014-12-07 LAB — CBC WITH DIFFERENTIAL/PLATELET
BASOS PCT: 1 %
Basophils Absolute: 0.1 10*3/uL (ref 0.0–0.1)
EOS ABS: 0.1 10*3/uL (ref 0.0–0.7)
Eosinophils Relative: 2 %
HCT: 35.8 % — ABNORMAL LOW (ref 39.0–52.0)
Hemoglobin: 11.6 g/dL — ABNORMAL LOW (ref 13.0–17.0)
Lymphocytes Relative: 25 %
Lymphs Abs: 1.2 10*3/uL (ref 0.7–4.0)
MCH: 28.6 pg (ref 26.0–34.0)
MCHC: 32.4 g/dL (ref 30.0–36.0)
MCV: 88.2 fL (ref 78.0–100.0)
MONO ABS: 0.6 10*3/uL (ref 0.1–1.0)
MONOS PCT: 12 %
Neutro Abs: 2.9 10*3/uL (ref 1.7–7.7)
Neutrophils Relative %: 60 %
Platelets: 58 10*3/uL — ABNORMAL LOW (ref 150–400)
RBC: 4.06 MIL/uL — ABNORMAL LOW (ref 4.22–5.81)
RDW: 18.4 % — AB (ref 11.5–15.5)
WBC: 4.8 10*3/uL (ref 4.0–10.5)

## 2014-12-07 LAB — RAPID URINE DRUG SCREEN, HOSP PERFORMED
AMPHETAMINES: NOT DETECTED
Barbiturates: NOT DETECTED
Benzodiazepines: NOT DETECTED
Cocaine: NOT DETECTED
OPIATES: NOT DETECTED
Tetrahydrocannabinol: NOT DETECTED

## 2014-12-07 LAB — PROTIME-INR
INR: 1.69 — AB (ref 0.00–1.49)
Prothrombin Time: 19.9 seconds — ABNORMAL HIGH (ref 11.6–15.2)

## 2014-12-07 MED ORDER — IOHEXOL 300 MG/ML  SOLN
25.0000 mL | Freq: Once | INTRAMUSCULAR | Status: AC | PRN
  Administered 2014-12-07: 25 mL via ORAL

## 2014-12-07 MED ORDER — OXYCODONE-ACETAMINOPHEN 5-325 MG PO TABS
2.0000 | ORAL_TABLET | Freq: Once | ORAL | Status: AC
  Administered 2014-12-07: 2 via ORAL
  Filled 2014-12-07: qty 2

## 2014-12-07 MED ORDER — TRAMADOL HCL 50 MG PO TABS: 100.0000 mg | ORAL_TABLET | Freq: Four times a day (QID) | ORAL | Status: DC | PRN

## 2014-12-07 MED ORDER — DOCUSATE SODIUM 100 MG PO CAPS: 100.0000 mg | ORAL_CAPSULE | Freq: Two times a day (BID) | ORAL | Status: DC

## 2014-12-07 MED ORDER — IOHEXOL 300 MG/ML  SOLN
100.0000 mL | Freq: Once | INTRAMUSCULAR | Status: AC | PRN
  Administered 2014-12-07: 100 mL via INTRAVENOUS

## 2014-12-07 NOTE — ED Notes (Signed)
Pt ambulated to the bathroom without difficulty.  

## 2014-12-07 NOTE — ED Notes (Signed)
Pt sts intermittent rectal bleeding x several weeks with some abd pain and diarrhea; pt sts notices when in toilet and when he wipes; pt denies blood thinners

## 2014-12-07 NOTE — ED Provider Notes (Signed)
CSN: [id number]     Arrival date & time 12/07/14  0710 History   First MD Initiated Contact with Patient 12/07/14 0720     Chief Complaint  Patient presents with  . Rectal Bleeding     (Consider location/radiation/quality/duration/timing/severity/associated sxs/prior Treatment) HPI Patient reports intermittent rectal bleeding and abdominal pain for months. He describes some diffuse generalized cramping abdominal pain. Nonlocalizing and no specific pattern. He reports that he sees blood when he has a bowel movement. He reports he will be bright red drops in the toilet and he also sees it when he wipes. Patient denies diarrhea. He denies he feels significant only constipated or strains very hard stool. He reports he does have to wake up frequently during the night to urinate. No fever, no vomiting. He reports he's been seen for a similar symptom in the past but has never followed up with colonoscopy or primary care physician. Past Medical History  Diagnosis Date  . Hypertension   . Hernia    Past Surgical History  Procedure Laterality Date  . Pelvic fracture surgery     History reviewed. No pertinent family history. Social History  Substance Use Topics  . Smoking status: Former Research scientist (life sciences)  . Smokeless tobacco: None  . Alcohol Use: Yes     Comment: ocassionally    Review of Systems  10 Systems reviewed and are negative for acute change except as noted in the HPI.   Allergies  Review of patient's allergies indicates no known allergies.  Home Medications   Prior to Admission medications   Medication Sig Start Date End Date Taking? Authorizing Provider  traMADol (ULTRAM) 50 MG tablet Take 2 tablets (100 mg total) by mouth every 6 (six) hours as needed. 12/07/14   [name], MD   BP 147/94 mmHg  Pulse 61  Temp(Src) 98.1 F (36.7 C) (Oral)  Resp 16  SpO2 98% Physical Exam  Constitutional: He is oriented to person, place, and time. He appears well-developed and  well-nourished.  HENT:  Head: Normocephalic and atraumatic.  Eyes: EOM are normal. Pupils are equal, round, and reactive to light.  Neck: Neck supple.  Cardiovascular: Normal rate, regular rhythm, normal heart sounds and intact distal pulses.   Pulmonary/Chest: Effort normal and breath sounds normal.  Abdominal: Soft. Bowel sounds are normal. He exhibits no distension and no mass. There is no tenderness. There is no guarding.  Genitourinary:  Visual inspection of the rectum shows a small fissure present and a nonthrombosed hemorrhoid. Digital examination reveals enlarged firm prostate and yellowish brown stool in the vault. No melena or blood in the rectal vault.  Musculoskeletal: Normal range of motion. He exhibits no edema.  Neurological: He is alert and oriented to person, place, and time. He has normal strength. Coordination normal. GCS eye subscore is 4. GCS verbal subscore is 5. GCS motor subscore is 6.  Skin: Skin is warm, dry and intact.  Psychiatric: He has a normal mood and affect.    ED Course  Procedures (including critical care time) Labs Review Labs Reviewed  CBC WITH DIFFERENTIAL/PLATELET - Abnormal; Notable for the following:    RBC 4.06 (*)    Hemoglobin 11.6 (*)    HCT 35.8 (*)    RDW 18.4 (*)    Platelets 58 (*)    All other components within normal limits  COMPREHENSIVE METABOLIC PANEL - Abnormal; Notable for the following:    Glucose, Bld 148 (*)    Calcium 8.7 (*)    Total Protein  6.2 (*)    Albumin 2.9 (*)    AST 83 (*)    All other components within normal limits  PROTIME-INR - Abnormal; Notable for the following:    Prothrombin Time 19.9 (*)    INR 1.69 (*)    All other components within normal limits  POC OCCULT BLOOD, ED - Abnormal; Notable for the following:    Fecal Occult Bld POSITIVE (*)    All other components within normal limits  URINALYSIS, ROUTINE W REFLEX MICROSCOPIC (NOT AT [facility])  URINE RAPID DRUG SCREEN, HOSP PERFORMED    Imaging  Review Ct Abdomen Pelvis W Contrast  12/07/2014  CLINICAL DATA:  Intermittent rectal bleeding, abdominal pain, diarrhea EXAM: CT ABDOMEN AND PELVIS WITH CONTRAST TECHNIQUE: Multidetector CT imaging of the abdomen and pelvis was performed using the standard protocol following bolus administration of intravenous contrast. 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 Again noted bilateral postsurgical SI joints fixation. The lung bases are unremarkable. There is geographic fatty infiltration of the liver. There is nodular liver contour highly suspicious for cirrhosis. No focal hepatic mass. No intrahepatic biliary ductal dilatation. The pancreas, spleen and adrenal glands are unremarkable. Atherosclerotic calcifications of distal abdominal aorta and iliac arteries. No aortic aneurysm. Kidneys are symmetrical in size and enhancement. No hydronephrosis or hydroureter. There is a midline upper abdominal wall ventral hernia containing fat measures about 3.1 cm. This is best seen in sagittal image 83 located about 1.8 cm above the umbilicus. Portal vein measures 1.3 cm in diameter. Some of varices are noted in splenic hilum axial image 24. Findings suggest portal hypertension. The distal sigmoid colon is decompressed. No significant thickening of colonic wall to suggest acute colitis. In axial image 70 there is small linear air tracking along the wall of sigmoid colon. I cannot exclude tiny amount of air within intramural veins of sigmoid colon. Clinical correlation is necessary to exclude ulcerative colitis or mucosal ulceration. No significant adenopathy. Further correlation with colonoscopy could be performed as clinically warranted. No pericecal inflammation. Normal appendix. The terminal ileum is unremarkable. No small bowel obstruction.  No ascites or free air.  No adenopathy. IMPRESSION: 1. In axial  image 70 there is small linear air tracking along the wall of sigmoid colon. I cannot exclude tiny amount of air within intramural veins of sigmoid colon. Clinical correlation is necessary to exclude ulcerative colitis or mucosal ulceration. No significant adenopathy. Further correlation with colonoscopy could be performed as clinically warranted. 2. Normal appendix.  No pericecal inflammation. 3. No hydronephrosis or hydroureter. 4. There is nodular contour of the liver and geographic fatty infiltration of the liver. Findings are consistent with cirrhosis. Mild prominent size portal vein highly suspicious for portal hypertension. 5. No small bowel obstruction. These results were called by telephone at the time of interpretation on 12/07/2014 at 2:08 pm to Dr. [name] , who verbally acknowledged these results. Electronically Signed   By: Lahoma Crocker M.D.   On: 12/07/2014 14:08   Dg Abd Acute W/chest  12/07/2014  CLINICAL DATA:  Mid and upper abdominal pain for the past 3 weeks, intermittent diarrhea EXAM: DG ABDOMEN ACUTE W/ 1V CHEST COMPARISON:  Abdominal pelvic CT scan of October 31, 2014 FINDINGS: The lungs are adequately inflated and clear. The heart and pulmonary vascularity are normal. The mediastinum is normal in width. There is no pleural effusion. Within the abdomen  there is a moderate amount of gas and stool throughout the colon with small amount of small bowel gas. There is no evidence of obstruction or ileus. The patient has undergone fusion across the SI joints bilaterally. The lumbar spine and observed portions of the pelvis are unremarkable. IMPRESSION: There is no active cardiopulmonary disease. Within the abdomen the gas pattern is nonspecific without evidence of obstruction. Given the intermittent diarrheal symptoms and the abdominal discomfort, colitis could be present. Repeat abdominal and pelvic CT scanning may be useful. Electronically Signed   By: David  Martin[place] M.D.   On: 12/07/2014  08:17   I have personally reviewed and evaluated these images and lab results as part of my medical decision-making.   EKG Interpretation None     Consult: Case reviewed with Dr. [place] Silos. He has reviewed the CT and at this time no immediate intervention or antibiotic therapy would be indicated. Plan will be for GI follow-up for outpatient colonoscopy. MDM   Final diagnoses:  Rectal bleeding  Hemorrhoids, unspecified hemorrhoid type  Anal fissure  Generalized abdominal pain  Cirrhosis of liver without ascites, unspecified hepatic cirrhosis type ([place])   The patient has had intermittent rectal bleeding for weeks to months. He denies ever seeking primary care treatment or having had screening colonoscopy. At this time by rectal examination, patient has a nonthrombosed hemorrhoid and small fissure. This is the likely cause of bleeding as the patient describes bright red blood after bowel movement and with wiping. Per radiology, CT has a finding which may be suggestive of mucosal disease such as colitis. There is no diffuse inflammation or leukocytosis. This is been reviewed with general surgery and at this time there does not appear to be indication for further diagnostic workup in the emergency department or appear to initiation of antibiotics. Patient is counseled on the necessity of GI follow-up for colonoscopy.  [name], MD 12/07/14 [phone]

## 2014-12-07 NOTE — Discharge Instructions (Signed)
Gastrointestinal Bleeding You must schedule a follow-up examination to get a colonoscopy. Rectal bleeding can sometimes be due to cancer or other problems that can only be diagnosed with a colonoscopy. Gastrointestinal (GI) bleeding means there is bleeding somewhere along the digestive tract, between the mouth and anus. CAUSES  There are many different problems that can cause GI bleeding. Possible causes include:  Esophagitis. This is inflammation, irritation, or swelling of the esophagus.  Hemorrhoids.These are veins that are full of blood (engorged) in the rectum. They cause pain, inflammation, and may bleed.  Anal fissures.These are areas of painful tearing which may bleed. They are often caused by passing hard stool.  Diverticulosis.These are pouches that form on the colon over time, with age, and may bleed significantly.  Diverticulitis.This is inflammation in areas with diverticulosis. It can cause pain, fever, and bloody stools, although bleeding is rare.  Polyps and cancer. Colon cancer often starts out as precancerous polyps.  Gastritis and ulcers.Bleeding from the upper gastrointestinal tract (near the stomach) may travel through the intestines and produce black, sometimes tarry, often bad smelling stools. In certain cases, if the bleeding is fast enough, the stools may not be black, but red. This condition may be life-threatening. SYMPTOMS   Vomiting bright red blood or material that looks like coffee grounds.  Bloody, black, or tarry stools. DIAGNOSIS  Your caregiver may diagnose your condition by taking your history and performing a physical exam. More tests may be needed, including:  X-rays and other imaging tests.  Esophagogastroduodenoscopy (EGD). This test uses a flexible, lighted tube to look at your esophagus, stomach, and small intestine.  Colonoscopy. This test uses a flexible, lighted tube to look at your colon. TREATMENT  Treatment depends on the cause of  your bleeding.   For bleeding from the esophagus, stomach, small intestine, or colon, the caregiver doing your EGD or colonoscopy may be able to stop the bleeding as part of the procedure.  Inflammation or infection of the colon can be treated with medicines.  Many rectal problems can be treated with creams, suppositories, or warm baths.  Surgery is sometimes needed.  Blood transfusions are sometimes needed if you have lost a lot of blood. If bleeding is slow, you may be allowed to go home. If there is a lot of bleeding, you will need to stay in the hospital for observation. HOME CARE INSTRUCTIONS   Take any medicines exactly as prescribed.  Keep your stools soft by eating foods that are high in fiber. These foods include whole grains, legumes, fruits, and vegetables. Prunes (1 to 3 a day) work well for many people.  Drink enough fluids to keep your urine clear or pale yellow. SEEK IMMEDIATE MEDICAL CARE IF:   Your bleeding increases.  You feel lightheaded, weak, or you faint.  You have severe cramps in your back or abdomen.  You pass large blood clots in your stool.  Your problems are getting worse. MAKE SURE YOU:   Understand these instructions.  Will watch your condition.  Will get help right away if you are not doing well or get worse.   This information is not intended to replace advice given to you by your health care provider. Make sure you discuss any questions you have with your health care provider.   Document Released: 01/07/2000 Document Revised: 12/27/2011 Document Reviewed: 06/29/2014 Elsevier Interactive Patient Education 2016 [employer]. Cirrhosis Cirrhosis is long-term (chronic) liver injury. The liver is your largest internal organ, and it performs many  functions. The liver converts food into energy, removes toxic material from your blood, makes important proteins, and absorbs necessary vitamins from your diet. If you have cirrhosis, it means many of  your healthy liver cells have been replaced by scar tissue. This prevents blood from flowing through your liver, which makes it difficult for your liver to function. This scarring is not reversible, but treatment can prevent it from getting worse.  CAUSES  Hepatitis C and long-term alcohol abuse are the most common causes of cirrhosis. Other causes include:  Nonalcoholic fatty liver disease.  Hepatitis B infection.  Autoimmune hepatitis.  Diseases that cause blockage of ducts inside the liver.  Inherited liver diseases.  Reactions to certain long-term medicines.  Parasitic infections.  Long-term exposure to certain toxins. RISK FACTORS You may have a higher risk of cirrhosis if you:  Have certain hepatitis viruses.  Abuse alcohol, especially if you are male.  Are overweight.  Share needles.  Have unprotected sex with someone who has hepatitis. SYMPTOMS  You may not have any signs and symptoms at first. Symptoms may not develop until the damage to your liver starts to get worse. Signs and symptoms of cirrhosis may include:   Tenderness in the right-upper part of your abdomen.  Weakness and tiredness (fatigue).  Loss of appetite.  Nausea.  Weight loss and muscle loss.  Itchiness.  Yellow skin and eyes (jaundice).  Buildup of fluid in the abdomen (ascites).  Swelling of the feet and ankles (edema).  Appearance of tiny blood vessels under the skin.  Mental confusion.  Easy bruising and bleeding. DIAGNOSIS  Your health care provider may suspect cirrhosis based on your symptoms and medical history, especially if you have other medical conditions or a history of alcohol abuse. Your health care provider will do a physical exam to feel your liver and check for signs of cirrhosis. Your health care provider may perform other tests, including:   Blood tests to check:   Whether you have hepatitis B or C.   Kidney function.  Liver function.  Imaging tests  such as:  MRI or CT scan to look for changes seen in advanced cirrhosis.  Ultrasound to see if normal liver tissue is being replaced by scar tissue.  A procedure using a long needle to take a sample of liver tissue (biopsy) for examination under a microscope. Liver biopsy can confirm the diagnosis of cirrhosis.  TREATMENT  Treatment depends on how damaged your liver is and what caused the damage. Treatment may include treating cirrhosis symptoms or treating the underlying causes of the condition to try to slow the progression of the damage. Treatment may include:  Making lifestyle changes, such as:   Eating a healthy diet.  Restricting salt intake.  Maintaining a healthy weight.   Not abusing drugs or alcohol.  Taking medicines to:  Treat liver infections or other infections.  Control itching.  Reduce fluid buildup.  Reduce certain blood toxins.  Reduce risk of bleeding from enlarged blood vessels in the stomach or esophagus (varices).  If varices are causing bleeding problems, you may need treatment with a procedure that ties up the vessels causing them to fall off (band ligation).  If cirrhosis is causing your liver to fail, your health care provider may recommend a liver transplant.  Other treatments may be recommended depending on any complications of cirrhosis, such as liver-related kidney failure (hepatorenal syndrome). HOME CARE INSTRUCTIONS   Take medicines only as directed by your health care provider. Do  not use drugs that are toxic to your liver. Ask your health care provider before taking any new medicines, including over-the-counter medicines.   Rest as needed.  Eat a well-balanced diet. Ask your health care provider or dietitian for more information.   You may have to follow a low-salt diet or restrict your water intake as directed.  Do not drink alcohol. This is especially important if you are taking acetaminophen.  Keep all follow-up visits as  directed by your health care provider. This is important. SEEK MEDICAL CARE IF:  You have fatigue or weakness that is getting worse.  You develop swelling of the hands, feet, legs, or face.  You have a fever.  You develop loss of appetite.  You have nausea or vomiting.  You develop jaundice.  You develop easy bruising or bleeding. SEEK IMMEDIATE MEDICAL CARE IF:  You vomit bright red blood or a material that looks like coffee grounds.  You have blood in your stools.  Your stools appear black and tarry.  You become confused.  You have chest pain or trouble breathing.   This information is not intended to replace advice given to you by your health care provider. Make sure you discuss any questions you have with your health care provider.   Document Released: 01/09/2005 Document Revised: 01/30/2014 Document Reviewed: 09/17/2013 Elsevier Interactive Patient Education 2016 [employer].  Emergency Department Resource Guide 1) Find a Doctor and Pay Out of Pocket Although you won't have to find out who is covered by your insurance plan, it is a good idea to ask around and get recommendations. You will then need to call the office and see if the doctor you have chosen will accept you as a new patient and what types of options they offer for patients who are self-pay. Some doctors offer discounts or will set up payment plans for their patients who do not have insurance, but you will need to ask so you aren't surprised when you get to your appointment.  2) Contact Your Local Health Department Not all health departments have doctors that can see patients for sick visits, but many do, so it is worth a call to see if yours does. If you don't know where your local health department is, you can check in your phone book. The CDC also has a tool to help you locate your state's health department, and many state websites also have listings of all of their local health departments.  3) Find a  [facility] If your illness is not likely to be very severe or complicated, you may want to try a walk in clinic. These are popping up all over the country in pharmacies, drugstores, and shopping centers. They're usually staffed by nurse practitioners or physician assistants that have been trained to treat common illnesses and complaints. They're usually fairly quick and inexpensive. However, if you have serious medical issues or chronic medical problems, these are probably not your best option.  No Primary Care Doctor: - Call Health Connect at  [phone] - they can help you locate a primary care doctor that  accepts your insurance, provides certain services, etc. - Physician Referral Service- [phone]  Chronic Pain Problems: Organization         Address  Phone   Notes  [facility]  [phone] Patients need to be referred by their primary care doctor.   Medication Assistance: Patent attorney  Notes  [facility] Medication [facility] [address] [place], [place] [address] [phone] --Must be a resident of [facility] -- Must have NO insurance coverage whatsoever (no Medicaid/ Medicare, etc.) -- The pt. MUST have a primary care doctor that directs their care regularly and follows them in the community   MedAssist  [phone]   [employer]  [phone]    Agencies that provide inexpensive medical care: Organization         Address  Phone   Notes  [place]  [phone]   [facility]    [phone]   [facility] [place], [place] [address] [phone]   [place] [address] [place] [phone]   Planned Parenthood    [phone]   [facility]    [phone]   [place] and [place] [address], [place] Phone:  [phone], Fax:  [phone] Hours of Operation:  9 am - 6 pm, M-F.  Also accepts Medicaid/Medicare and self-pay.  [facility] for [place], [address], [place] Phone: [phone], Fax: [phone]. Hours of Operation:  8:30 am - 5:30 pm, M-F.  Also accepts Medicaid and self-pay.  [facility] [address] [place] Phone: [phone]   [place], [place], [place] [phone], Ext. [phone] Mondays & Thursdays: 7-9 AM.  First 15 patients are seen on a first come, first serve basis.    [place] Providers:  Organization         Address  Phone   Notes  [facility] [address], [place] [phone] Also accepts self-pay patients.  [facility] [id number] [place], [place]  [phone]   [place], [address], [place] [phone]   [facility] [address] [place] [phone]   Lucianne Lei [address], [place]   [phone] Only accepts [place] Access [place] patients after they have their name applied to their card.   Self-Pay (no insurance) in [facility]:  Organization         Address  Phone   Notes  Sickle Cell Patients, [facility] [place] [phone]   [facility] [place] [phone]   [facility] [place]  [place], [place], [place] [phone]   [facility]/Dr. Osei-Bonsu  [address] [place] or [address], [place] [phone] Phone number for both [place] and [place] locations is the same.  Urgent Medical and [facility] [address], [place] [phone]   [facility] [address], [place] or [address] [phone]     [facility] [address], [place] [phone], phone; [phone], fax Sees patients 1st and 3rd Saturday of every month.  Must not qualify for public or private insurance (i.e. Medicaid, Medicare, [place] Health Choice, Veterans' Benefits)  Household income should be no more than 200% of the poverty level The clinic cannot treat you if you are pregnant or think you are pregnant  Sexually  transmitted diseases are not treated at the clinic.    Dental Care: Organization         Address  Phone  Notes  [facility] Department of [facility] [place] [phone] Accepts children up to age 26 who are enrolled in [place] or [place]; pregnant women with a Medicaid card; and children who have applied for Medicaid or [place] Health Choice, but were declined, whose parents can pay a reduced fee at time of service.  [facility] Department of [facility]  [address], [place] [phone] Accepts children up to age 63 who are enrolled in [place] or [place]; pregnant women with a Medicaid card; and children who have applied for Medicaid or [place] Health Choice, but were declined, whose parents can pay a reduced fee at time of service.  [place] Adult Dental Access PROGRAM  [place] [phone] Patients are seen by appointment only. Walk-ins are not accepted. [place] will see patients 31 years of age and older. Monday - Tuesday (8am-5pm) Most Wednesdays (8:30-5pm) $30 per visit, cash only  [facility] Adult Dental Access PROGRAM  [address], [facility] [phone] Patients are seen by appointment only. Walk-ins are not accepted. [place] will see patients 32 years of age and older. One Wednesday Evening (Monthly: Volunteer Based).  $30 per visit, cash only  [place]  [phone] for adults; Children under age 8, call  Graduate Pediatric Dentistry at [phone]. Children aged 85-14, please call [phone] to request a pediatric application.  Dental services are provided in all areas of dental care including fillings, crowns and bridges, complete and partial dentures, implants, gum treatment, root canals, and extractions. Preventive care is also provided. Treatment is provided to both adults and children. Patients are selected via a lottery and there is often a waiting list.   [facility] [address], [place]  [phone] [email or web address]   [facility] [address] [place], [place] [phone], Ext. [phone] Second and Fourth Thursday of each month, opens at 6:30 AM; Clinic ends at 9 AM.  Patients are seen on a first-come first-served basis, and a limited number are seen during each clinic.   [facility]  [address] Hillard Danker [place], [place] [phone]   Eligibility Requirements You must have lived in [place], [place], or [place] counties for at least the last three months.   You cannot be eligible for state or federal sponsored [employer], including [employer], [place], or [employer].   You generally cannot be eligible for healthcare insurance through your employer.    How to apply: Eligibility screenings are held every Tuesday and Wednesday afternoon from 1:00 pm until 4:00 pm. You do not need an appointment for the interview!  [facility] [address] [place], [place]   [place]  [place] Department  [address]  [phone]    Behavioral Health Resources in the Community: Intensive Outpatient Programs Organization         Address  Phone  Notes  [place]. [address], [place], [place] [phone]   [facility] Outpatient [address], [place], [place]   ADS: Alcohol & Drug Svcs [address], [place], [place]  [place] [address],  [place], [place] or [phone]   Substance Abuse Resources Organization         Address  Phone  Notes  Alcohol and Drug Services  3108524307   [place]  [phone]   The [place]   Chinita Pester  [phone]   Residential & Outpatient Substance Abuse Program  [phone]   Psychological Services Organization         Address  Phone  Notes  [facility]  [place]  [phone]   [place] [address] [place] or [phone]    Mobile Crisis Teams Organization         Address  Phone  Notes  [facility], Mobile Crisis Care Unit  [phone]   [facility]  [address] [place], [place]   [place] [address], [place] [phone]    Self-Help/Support Groups Organization         Address  Phone             Notes  [place]. of [place] - variety of support groups  [place] Call for more information  Narcotics Anonymous (NA), [facility] [address], [employer] [place]  2 meetings at this location   Special educational needs teacher         Address  Phone  Notes  [facility]idential [facility] [place],    [place]  [phone]   [facility]  [address], [place] [address], [place], [place]   [place], [place] [phone] Admissions: 8am-3pm M-F  Incentives Substance [place] [address]    [place], [place] [phone]   [facility] [address] [place], [place], [place]   [facility] [address],  [place], [place]   [facility] - Intensive Outpatient [address] Kristeen Mans [address], [place], [place]   [facility] [address]  [place], [place] [phone] or [phone]   [facility] (RTS) [address] [place], [place] Accepts Medicaid  [facility] [address],  [place] [phone] Substance Abuse/Addiction Treatment   [facility] Organization         Address  Phone  Notes  [facility]  [phone]   Domenic Schwab, PhD [address] Arlis [place], [place]   [phone] or [phone]   [place] Evarts [place], [place] [phone]   [facility] [address] [place], [place] [phone] Insurance/Medicaid/sponsorship through [facility] and Families [address]                                    [place], [place] [phone] [place] [address], [place] [phone]    Dr. Adele Schilder  [phone]   [facility] Dept. 1) [address] [place] 2) [place] 3)  [place] [address], [place] [phone]  [phone]   [place] [phone] or [phone] (  After Hours)

## 2015-01-04 ENCOUNTER — Encounter (HOSPITAL_COMMUNITY): Payer: Self-pay | Admitting: Emergency Medicine

## 2015-01-04 ENCOUNTER — Emergency Department (HOSPITAL_COMMUNITY)
Admission: EM | Admit: 2015-01-04 | Discharge: 2015-01-04 | Disposition: A | Discharge status: Home / Self Care | Payer: Self-pay | Attending: Emergency Medicine | Admitting: Emergency Medicine

## 2015-01-04 DIAGNOSIS — Y9389 Activity, other specified: Secondary | ICD-10-CM | POA: Insufficient documentation

## 2015-01-04 DIAGNOSIS — M70832 Other soft tissue disorders related to use, overuse and pressure, left forearm: Secondary | ICD-10-CM | POA: Insufficient documentation

## 2015-01-04 DIAGNOSIS — Z87891 Personal history of nicotine dependence: Secondary | ICD-10-CM | POA: Insufficient documentation

## 2015-01-04 DIAGNOSIS — T148XXA Other injury of unspecified body region, initial encounter: Secondary | ICD-10-CM

## 2015-01-04 DIAGNOSIS — I10 Essential (primary) hypertension: Secondary | ICD-10-CM | POA: Insufficient documentation

## 2015-01-04 DIAGNOSIS — Z8719 Personal history of other diseases of the digestive system: Secondary | ICD-10-CM | POA: Insufficient documentation

## 2015-01-04 DIAGNOSIS — M25522 Pain in left elbow: Secondary | ICD-10-CM | POA: Insufficient documentation

## 2015-01-04 DIAGNOSIS — Z79899 Other long term (current) drug therapy: Secondary | ICD-10-CM | POA: Insufficient documentation

## 2015-01-04 DIAGNOSIS — G8929 Other chronic pain: Secondary | ICD-10-CM | POA: Insufficient documentation

## 2015-01-04 DIAGNOSIS — X503XXA Overexertion from repetitive movements, initial encounter: Secondary | ICD-10-CM

## 2015-01-04 MED ORDER — NAPROXEN 500 MG PO TABS: 500.0000 mg | ORAL_TABLET | Freq: Two times a day (BID) | ORAL | Status: DC

## 2015-01-04 MED ORDER — KETOROLAC TROMETHAMINE 60 MG/2ML IM SOLN
30.0000 mg | Freq: Once | INTRAMUSCULAR | Status: AC
  Administered 2015-01-04: 30 mg via INTRAMUSCULAR
  Filled 2015-01-04: qty 2

## 2015-01-04 NOTE — ED Notes (Signed)
Pt. 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 No swelling or deformity.

## 2015-01-04 NOTE — ED Provider Notes (Signed)
CSN: [id number]     Arrival date & time 01/04/15  2049 History  By signing my name below, I, Starleen Arms, attest that this documentation has been prepared under the direction and in the presence of Selena Paiton [place], PA-C. Electronically Signed: Starleen Arms ED Scribe. 01/04/2015. 9:25 PM.    Chief Complaint  Patient presents with  . Arm Pain   The history is provided by the patient. No language interpreter was used.    HPI Comments: [name] is a 64 y.o. male who presents to the Emergency Department complaining of gradually worsening, constant left elbow pain with intermittent swelling onset 1 month ago without injury.  The pain has been unrelieved by Advil and Aleve.  Patient states that he has taken Percocet in the past for numerous injuries and it is the only thing that has helped his pain. 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     PCP: none Past Medical History  Diagnosis Date  . Hypertension   . Hernia    Past Surgical History  Procedure Laterality Date  . Pelvic fracture surgery     No family history on file. Social History  Substance Use Topics  . Smoking status: Former Research scientist (life sciences)  . Smokeless tobacco: None  . Alcohol Use: Yes     Comment: ocassionally    Review of Systems  Musculoskeletal: Positive for arthralgias.  All other systems reviewed and are negative.  Allergies  Review of patient's allergies indicates no known allergies.  Home Medications   Prior to Admission medications   Medication Sig Start Date End Date Taking? Authorizing Provider  docusate sodium (COLACE) 100 MG capsule Take 1 capsule (100 mg total) by mouth every 12 (twelve) hours. 12/07/14   [name], MD  traMADol (ULTRAM) 50 MG tablet Take 2 tablets (100 mg total) by mouth every 6 (six) hours as needed. 12/07/14   [name], MD   BP 156/94 mmHg  Pulse 76  Temp(Src) 97.5 F (36.4 C) (Oral)  Resp 16  SpO2 97% Physical Exam   Constitutional: He is oriented to person, place, and time. He appears well-developed and well-nourished. No distress.  HENT:  Head: Normocephalic and atraumatic.  Eyes: Conjunctivae and EOM are normal.  Neck: Neck supple. No tracheal deviation present.  Cardiovascular: Normal rate.   Pulmonary/Chest: Effort normal. No respiratory distress.  Musculoskeletal: Normal range of motion. He exhibits no edema or tenderness.  L elbow with FROM, no edema, no tenderness. STrength and sensation intact proximally and distally  Neurological: He is alert and oriented to person, place, and time.  Skin: Skin is warm and dry.  Psychiatric: He has a normal mood and affect. His behavior is normal.  Nursing note and vitals reviewed.   ED Course  Procedures (including critical care time)  DIAGNOSTIC STUDIES: Oxygen Saturation is 97% on RA, normal by my interpretation.    COORDINATION OF CARE:  9:23 PM Discussed treatment plan with patient at bedside.  Patient acknowledges and agrees with plan.    Labs Review Labs Reviewed - No data to display  Imaging Review No results found. I have personally reviewed and evaluated these images and lab results as part of my medical decision-making.   EKG Interpretation None      MDM   Final diagnoses:  Elbow pain, chronic, left  Overuse injury  Essential hypertension    Pt with chronic left elbow pain, likely secondary to overuse due to pt's  occupation. Discussed that I can give toradol here and encouraged NSAID use but given pt's nonfocal exam, no tenderness, I am going to hold off on narcotic rx at this time. No indication for imaging or further workup at this time as pt's pain is chronic and I do not suspect septic joint, fracture, dislocation, or other acute pathology. Pt encouraged to f/u with PCP for longer term pain management. Pt is also slightly hypertensive in the ED today. Encouraged pt to f/u with pcp regarding bp as well.   I personally  performed the services described in this documentation, which was scribed in my presence. The recorded information has been reviewed and is accurate.    Anne Ng, PA-C 01/05/15 [place], MD 01/05/15 [phone]

## 2015-01-04 NOTE — Discharge Instructions (Signed)
As we discussed, your arm and elbow pain is likely due to overuse from work. Your pain is chronic. Your exam was normal today and there is no indication for x-ray at this time. Please contact one of the clinics below to establish primary care and they can further help manage your pain going forward. Your blood pressure is also elevated today. Please talk to your doctor once you establish primary care about your blood pressure. If it remains high you might need to be on blood pressure medicine.   Take medications as prescribed. Return to the emergency room for worsening condition or new concerning symptoms. Follow up with your regular doctor. If you don't have a regular doctor use one of the numbers below to establish a primary care doctor.   Emergency Department Resource Guide 1) Find a Doctor and Pay Out of Pocket Although you won't have to find out who is covered by your insurance plan, it is a good idea to ask around and get recommendations. You will then need to call the office and see if the doctor you have chosen will accept you as a new patient and what types of options they offer for patients who are self-pay. Some doctors offer discounts or will set up payment plans for their patients who do not have insurance, but you will need to ask so you aren't surprised when you get to your appointment.  2) Contact Your Local Health Department Not all health departments have doctors that can see patients for sick visits, but many do, so it is worth a call to see if yours does. If you don't know where your local health department is, you can check in your phone book. The CDC also has a tool to help you locate your state's health department, and many state websites also have listings of all of their local health departments.  3) Find a [facility] If your illness is not likely to be very severe or complicated, you may want to try a walk in clinic. These are popping up all over the country in pharmacies,  drugstores, and shopping centers. They're usually staffed by nurse practitioners or physician assistants that have been trained to treat common illnesses and complaints. They're usually fairly quick and inexpensive. However, if you have serious medical issues or chronic medical problems, these are probably not your best option.  No Primary Care Doctor: - Call Health Connect at  [phone] - they can help you locate a primary care doctor that  accepts your insurance, provides certain services, etc. - Physician Referral [phone]  Emergency Department Resource Guide 1) Find a Doctor and Pay Out of Pocket Although you won't have to find out who is covered by your insurance plan, it is a good idea to ask around and get recommendations. You will then need to call the office and see if the doctor you have chosen will accept you as a new patient and what types of options they offer for patients who are self-pay. Some doctors offer discounts or will set up payment plans for their patients who do not have insurance, but you will need to ask so you aren't surprised when you get to your appointment.  2) Contact Your Local Health Department Not all health departments have doctors that can see patients for sick visits, but many do, so it is worth a call to see if yours does. If you don't know where your local health department is, you can check in your phone book.  The CDC also has a tool to help you locate your state's health department, and many state websites also have listings of all of their local health departments.  3) Find a [facility] If your illness is not likely to be very severe or complicated, you may want to try a walk in clinic. These are popping up all over the country in pharmacies, drugstores, and shopping centers. They're usually staffed by nurse practitioners or physician assistants that have been trained to treat common illnesses and complaints. They're usually fairly quick and  inexpensive. However, if you have serious medical issues or chronic medical problems, these are probably not your best option.  No Primary Care Doctor: - Call Health Connect at  [phone] - they can help you locate a primary care doctor that  accepts your insurance, provides certain services, etc. - Physician Referral Service- [phone]  Chronic Pain Problems: Organization         Address  Phone   Notes  [facility]  [phone] Patients need to be referred by their primary care doctor.   Medication Assistance: Organization         Address  Phone   Notes  [facility] Medication [facility] [address] [place], [place] [address] [phone] --Must be a resident of [facility] -- Must have NO insurance coverage whatsoever (no Medicaid/ Medicare, etc.) -- The pt. MUST have a primary care doctor that directs their care regularly and follows them in the community   [facility]  [phone]   [employer]  [phone]    Agencies that provide inexpensive medical care: Organization         Address  Phone   Notes  [place]  [phone]   [facility]    [phone]   [facility] [place], [place] [address] [phone]   [place] [address] [place] [phone]   [facility]anned Parenthood    [phone]   [facility]    [phone]   [place] and [address], [place] Phone:  [phone], Fax:  [phone] Hours of Operation:  9 am - 6 pm, M-F.  Also accepts Medicaid/Medicare and self-pay.  [facility] for [place], [address], [place] Phone: [phone], Fax: [phone]. Hours of Operation:  8:30 am - 5:30 pm, M-F.  Also accepts Medicaid and self-pay.  [name] [place] [address], [place] Phone: [phone]   [place], [place], [place] [phone], Ext. [phone] Mondays & Thursdays: 7-9 AM.  First 15 patients are seen on a first come, first serve basis.    [place] Providers:  Organization         Address  Phone   Notes  [facility] [address],  [phone] Also accepts self-pay patients.  [facility] [id number] [place], [place]  [phone]   [place], [address], [place] [phone]   [facility] [address], [place] [phone]   Lucianne Lei [address], [place]   [phone] Only accepts [place] patients after they have their name applied to their card.   Self-Pay (  no insurance) in [facility]:  Organization         Address  Phone   Notes  Sickle Cell Patients, [place] [phone]   [facility] [place] [phone]   [facility] [place]  [place], [place], [place] [phone]   [facility]/Dr. Osei-Bonsu  [address] [place] or [address], [place] [phone] Phone number for both [place] and [place] locations is the same.  [facility]gent [facility] and [facility] [address], [place] [phone]   [facility] [address] [place] or [address] [phone]   [facility] [address], [place] [phone], phone; [phone], fax Sees patients 1st and 3rd Saturday of every month.  Must not qualify for public or private insurance (i.e. Medicaid, Medicare, [place] Health Choice, Veterans' Benefits)  Household income should be no more than 200% of the poverty level The clinic cannot treat you if you are pregnant or  think you are pregnant  Sexually transmitted diseases are not treated at the clinic.

## 2015-02-03 ENCOUNTER — Encounter (HOSPITAL_COMMUNITY): Payer: Self-pay | Admitting: Vascular Surgery

## 2015-02-03 ENCOUNTER — Emergency Department (HOSPITAL_COMMUNITY)
Admission: EM | Admit: 2015-02-03 | Discharge: 2015-02-04 | Disposition: A | Discharge status: Home / Self Care | Payer: Self-pay | Attending: Emergency Medicine | Admitting: Emergency Medicine

## 2015-02-03 DIAGNOSIS — M25522 Pain in left elbow: Secondary | ICD-10-CM | POA: Insufficient documentation

## 2015-02-03 DIAGNOSIS — Z8719 Personal history of other diseases of the digestive system: Secondary | ICD-10-CM | POA: Insufficient documentation

## 2015-02-03 DIAGNOSIS — Z87891 Personal history of nicotine dependence: Secondary | ICD-10-CM | POA: Insufficient documentation

## 2015-02-03 DIAGNOSIS — I10 Essential (primary) hypertension: Secondary | ICD-10-CM | POA: Insufficient documentation

## 2015-02-03 MED ORDER — KETOROLAC TROMETHAMINE 60 MG/2ML IM SOLN
60.0000 mg | Freq: Once | INTRAMUSCULAR | Status: AC
  Administered 2015-02-03: 60 mg via INTRAMUSCULAR
  Filled 2015-02-03: qty 2

## 2015-02-03 MED ORDER — NAPROXEN 500 MG PO TABS: 500.0000 mg | ORAL_TABLET | Freq: Two times a day (BID) | ORAL | Status: DC

## 2015-02-03 NOTE — Discharge Instructions (Signed)
You were evaluated in the ED today for your left elbow pain. There is not appear to be an emergent cause for your symptoms at this time. You may use a brace as we discussed. You may also use anti-inflammatories such as Aleve as prescribed to help with your discomfort. Please follow-up with [place] and wellness in order to establish primary care and for further evaluation and management of your symptoms. Return to ED for any new or worsening symptoms.  Joint Pain Joint pain, which is also called arthralgia, can be caused by many things. Joint pain often goes away when you follow your health care provider's instructions for relieving pain at home. However, joint pain can also be caused by conditions that require further treatment. Common causes of joint pain include:  Bruising in the area of the joint.  Overuse of the joint.  Wear and tear on the joints that occur with aging (osteoarthritis).  Various other forms of arthritis.  A buildup of a crystal form of uric acid in the joint (gout).  Infections of the joint (septic arthritis) or of the bone (osteomyelitis). Your health care provider may recommend medicine to help with the pain. If your joint pain continues, additional tests may be needed to diagnose your condition. HOME CARE INSTRUCTIONS Watch your condition for any changes. Follow these instructions as directed to lessen the pain that you are feeling.  Take medicines only as directed by your health care provider.  Rest the affected area for as long as your health care provider says that you should. If directed to do so, raise the painful joint above the level of your heart while you are sitting or lying down.  Do not do things that cause or worsen pain.  If directed, apply ice to the painful area:  Put ice in a plastic bag.  Place a towel between your skin and the bag.  Leave the ice on for 20 minutes, 2-3 times per day.  Wear an elastic bandage, splint, or sling as  directed by your health care provider. Loosen the elastic bandage or splint if your fingers or toes become numb and tingle, or if they turn cold and blue.  Begin exercising or stretching the affected area as directed by your health care provider. Ask your health care provider what types of exercise are safe for you.  Keep all follow-up visits as directed by your health care provider. This is important. SEEK MEDICAL CARE IF:  Your pain increases, and medicine does not help.  Your joint pain does not improve within 3 days.  You have increased bruising or swelling.  You have a fever.  You lose 10 lb (4.5 kg) or more without trying. SEEK IMMEDIATE MEDICAL CARE IF:  You are not able to move the joint.  Your fingers or toes become numb or they turn cold and blue.   This information is not intended to replace advice given to you by your health care provider. Make sure you discuss any questions you have with your health care provider.   Document Released: 01/09/2005 Document Revised: 01/30/2014 Document Reviewed: 10/21/2013 Elsevier Interactive Patient Education [employer].

## 2015-02-03 NOTE — ED Provider Notes (Signed)
CSN: [id number]     Arrival date & time 02/03/15  2218 History  By signing my name below, I, [name], attest that this documentation has been prepared under the direction and in the presence of [employer], PA-C. Electronically Signed: [name], ED Scribe. 02/03/2015. 11:45 PM.    Chief Complaint  Patient presents with  . Elbow Pain    The history is provided by the patient. No language interpreter was used.   HPI Comments: [name] is a 65 y.o. male who presents to the Emergency Department complaining of acing left elbow pain onset 3 weeks prior. Pt states that he has tried ibuprofen, tylenol and tramadol with no relief. He states that the pain is worse when bending his elbow. He states that the pain is also worse when flexing and extending his wrist. . Pt denies any injury or trauma to the elbow. Pt denies numbness or tingling. Pain is moderate. No other modifying factors. Patient has not followed up with primary care.  Past Medical History  Diagnosis Date  . Hypertension   . Hernia    Past Surgical History  Procedure Laterality Date  . Pelvic fracture surgery     No family history on file. Social History  Substance Use Topics  . Smoking status: Former Research scientist (life sciences)  . Smokeless tobacco: None  . Alcohol Use: Yes     Comment: ocassionally    Review of Systems A complete 10 system review of systems was obtained and all systems are negative except as noted in the HPI and PMH.     Allergies  Review of patient's allergies indicates no known allergies.  Home Medications   Prior to Admission medications   Medication Sig Start Date End Date Taking? Authorizing Provider  docusate sodium (COLACE) 100 MG capsule Take 1 capsule (100 mg total) by mouth every 12 (twelve) hours. Patient not taking: Reported on 01/04/2015 12/07/14   [name], MD  naproxen (NAPROSYN) 500 MG tablet Take 1 tablet (500 mg total) by mouth 2 (two) times daily. 02/03/15   [name], PA-C  traMADol (ULTRAM) 50 MG tablet Take 2 tablets (100 mg total) by mouth every 6 (six) hours as needed. Patient not taking: Reported on 01/04/2015 12/07/14   [name], MD   BP 141/81 mmHg  Pulse 79  Temp(Src) 98.2 F (36.8 C) (Oral)  Resp 20  SpO2 96%   Physical Exam  Constitutional: He is oriented to person, place, and time. He appears well-developed and well-nourished. No distress.  HENT:  Head: Normocephalic and atraumatic.  Eyes: Conjunctivae and EOM are normal.  Neck: Neck supple. No tracheal deviation present.  Cardiovascular: Normal rate, regular rhythm, normal heart sounds and intact distal pulses.   Pulmonary/Chest: Effort normal. No respiratory distress.  Musculoskeletal: Normal range of motion.  Mild tenderness over the insertion of left brachial radialis. Discomfort is reproduced with extension of left wrist. Full active range of motion of left elbow. No erythema, edema or other concerning signs for infection. Compartments are soft.  Neurological: He is alert and oriented to person, place, and time.  Skin: Skin is warm and dry.  Psychiatric: He has a normal mood and affect. His behavior is normal.  Nursing note and vitals reviewed.   ED Course  Procedures (including critical care time) DIAGNOSTIC STUDIES: Oxygen Saturation is 96% on RA, normal by my interpretation.    COORDINATION OF CARE: 11:11 PM-Discussed treatment plan with pt at bedside and pt agreed to plan.  Labs Review Labs Reviewed - No data to display  Imaging Review No results found.    EKG Interpretation None     Meds given in ED:  Medications  ketorolac (TORADOL) injection 60 mg (60 mg Intramuscular Given 02/03/15 2329)    New Prescriptions   No medications on file   Filed Vitals:   02/03/15 2231  BP: 141/81  Pulse: 79  Temp: 98.2 F (36.8 C)  TempSrc: Oral  Resp: 20  SpO2: 96%    MDM  [name] is a 64 y.o. male comes in for evaluation of left elbow  pain ongoing for several months. No known injury. He reports he has been using it more frequently at work. Patient has mild tenderness over the origin of brachioradialis, discomfort is reproduced with wrist extension. Symptoms consistent with a tendinitis. Low suspicion for septic joint, hemarthrosis, compartment syndrome, fracture. Encouraged OTC medications/anti-inflammatory such as Aleve. Also discussed possibly using a brace. Given referral to community health and wellness the patient may establish primary care. Stressed follow-up The patient appears reasonably screened and/or stabilized for discharge and I doubt any other medical condition or other EMC requiring further screening, evaluation, or treatment in the ED at this time prior to discharge.   Final diagnoses:  Left elbow pain     I personally performed the services described in this documentation, which was scribed in my presence. The recorded information has been reviewed and is accurate.      [name], PA-C 02/04/15 0003  [name], MD 02/04/15 [phone]

## 2015-02-03 NOTE — ED Notes (Signed)
Pt reports to the ED for eval of left elbow pain x several days. Reports it became severe today. He has a hx of left elbow pain and he believes the joint may be arthritic. Denies any known injury to the elbow. CMS and full ROM intact. Has tried Tramadol and Aleve without relief. Pt A&Ox4, resp e/u, and skin warm and dry.

## 2015-02-03 NOTE — ED Notes (Signed)
Patient able to ambulate independently  

## 2015-03-25 ENCOUNTER — Encounter (HOSPITAL_COMMUNITY): Payer: Self-pay | Admitting: Emergency Medicine

## 2015-03-25 ENCOUNTER — Emergency Department (HOSPITAL_COMMUNITY)
Admission: EM | Admit: 2015-03-25 | Discharge: 2015-03-26 | Disposition: A | Discharge status: Home / Self Care | Payer: Self-pay | Attending: Emergency Medicine | Admitting: Emergency Medicine

## 2015-03-25 DIAGNOSIS — Y9289 Other specified places as the place of occurrence of the external cause: Secondary | ICD-10-CM | POA: Insufficient documentation

## 2015-03-25 DIAGNOSIS — W208XXA Other cause of strike by thrown, projected or falling object, initial encounter: Secondary | ICD-10-CM | POA: Insufficient documentation

## 2015-03-25 DIAGNOSIS — Z8719 Personal history of other diseases of the digestive system: Secondary | ICD-10-CM | POA: Insufficient documentation

## 2015-03-25 DIAGNOSIS — Y9389 Activity, other specified: Secondary | ICD-10-CM | POA: Insufficient documentation

## 2015-03-25 DIAGNOSIS — I10 Essential (primary) hypertension: Secondary | ICD-10-CM | POA: Insufficient documentation

## 2015-03-25 DIAGNOSIS — Z791 Long term (current) use of non-steroidal anti-inflammatories (NSAID): Secondary | ICD-10-CM | POA: Insufficient documentation

## 2015-03-25 DIAGNOSIS — Y998 Other external cause status: Secondary | ICD-10-CM | POA: Insufficient documentation

## 2015-03-25 DIAGNOSIS — S9031XA Contusion of right foot, initial encounter: Secondary | ICD-10-CM | POA: Insufficient documentation

## 2015-03-25 DIAGNOSIS — Z87891 Personal history of nicotine dependence: Secondary | ICD-10-CM | POA: Insufficient documentation

## 2015-03-25 NOTE — ED Notes (Signed)
Pt. presents with right foot pain/swelling injured this evening when a block of wood fell on it .

## 2015-03-25 NOTE — ED Provider Notes (Signed)
CSN: [id number]     Arrival date & time 03/25/15  2342 History  By signing my name below, I, [name], attest that this documentation has been prepared under the direction and in the presence of  [facility], PA-C. Electronically Signed: [name], ED Scribe. 03/25/2015. 11:58 PM.    Chief Complaint  Patient presents with  . Foot Injury   The history is provided by the patient. No language interpreter was used.    HPI Comments: [name] is a 65 y.o. male with h/o HTN who presents to the Emergency Department complaining of gradually worsening 10/10 right foot, right ankle pain and swelling onset at [place] after dropping a block of wood on his foot. He notes that he had steel toed boots on. Pt states that pain is worsened with movement, weight-bearing and ambulation. Pt denies taking OTC medications at home to improve symptoms. He denies numbness, paresthesia, additional injuries.   Past Medical History  Diagnosis Date  . Hypertension   . Hernia    Past Surgical History  Procedure Laterality Date  . Pelvic fracture surgery     No family history on file. Social History  Substance Use Topics  . Smoking status: Former Research scientist (life sciences)  . Smokeless tobacco: None  . Alcohol Use: Yes     Comment: ocassionally    Review of Systems  Musculoskeletal: Positive for joint swelling and arthralgias.  Neurological: Negative for numbness.   Allergies  Review of patient's allergies indicates no known allergies.  Home Medications   Prior to Admission medications   Medication Sig Start Date End Date Taking? Authorizing Provider  docusate sodium (COLACE) 100 MG capsule Take 1 capsule (100 mg total) by mouth every 12 (twelve) hours. Patient not taking: Reported on 01/04/2015 12/07/14   [name], MD  ibuprofen (ADVIL,MOTRIN) 800 MG tablet Take 1 tablet (800 mg total) by mouth 3 (three) times daily. 03/26/15   Ozella Almond Devlin Mcveigh, PA-C  naproxen (NAPROSYN) 500 MG tablet Take 1 tablet  (500 mg total) by mouth 2 (two) times daily. 02/03/15   [name], PA-C  oxyCODONE-acetaminophen (PERCOCET/ROXICET) 5-325 MG tablet Take 1 tablet by mouth every 6 (six) hours as needed for severe pain. 03/26/15   Delesha Pohlman Pilcher Majesti Gambrell, PA-C   BP 151/94 mmHg  Pulse 75  Temp(Src) 97.8 F (36.6 C) (Oral)  Resp 18  SpO2 98% Physical Exam  Constitutional: He is oriented to person, place, and time. He appears well-developed and well-nourished.  HENT:  Head: Normocephalic and atraumatic.  Cardiovascular: Normal rate, regular rhythm and normal heart sounds.  Exam reveals no gallop and no friction rub.   No murmur heard. Pulmonary/Chest: Effort normal and breath sounds normal. No respiratory distress. He has no wheezes. He has no rales.  Musculoskeletal:  Right ankle and foot with full ROM. No erythema, ecchymosis, or deformity appreciated. Swelling noted. TTP of lateral malleolus, second and third toes, and top of foot. No TTP or swelling of calf. No break in skin. No warmth. Achilles intact. Good pedal pulse and cap refill of all toes. Wiggling toes without difficulty. Sensation grossly intact.  Neurological: He is alert and oriented to person, place, and time.  Skin: Skin is warm and dry.  Psychiatric: He has a normal mood and affect. His behavior is normal.  Nursing note and vitals reviewed.   ED Course  Procedures (including critical care time) DIAGNOSTIC STUDIES: Oxygen Saturation is 98% on RA, normal by my interpretation.    COORDINATION OF CARE:  11:57 PM Discussed treatment plan with pt at bedside which includes XR and pt agreed to plan.   Imaging Review Dg Ankle Complete Right  03/26/2015  CLINICAL DATA:  65 year old male with trauma to right foot.  Type is EXAM: RIGHT FOOT COMPLETE - 3+ VIEW; RIGHT ANKLE - COMPLETE 3+ VIEW COMPARISON:  None. FINDINGS: There is no acute fracture or dislocation. There is slight chronic appearing irregularity of the proximal phalanx of the third  digit seen on the oblique projection. The ankle mortise is intact. There is diffuse soft tissue swelling and edema of the distal lower extremity and foot. No radiopaque foreign object identified. IMPRESSION: No acute osseous pathology. Diffuse soft tissue edema. Electronically Signed   By: Anner Crete M.D.   On: 03/26/2015 01:01   Dg Foot Complete Right  03/26/2015  CLINICAL DATA:  65 year old male with trauma to right foot.  Type is EXAM: RIGHT FOOT COMPLETE - 3+ VIEW; RIGHT ANKLE - COMPLETE 3+ VIEW COMPARISON:  None. FINDINGS: There is no acute fracture or dislocation. There is slight chronic appearing irregularity of the proximal phalanx of the third digit seen on the oblique projection. The ankle mortise is intact. There is diffuse soft tissue swelling and edema of the distal lower extremity and foot. No radiopaque foreign object identified. IMPRESSION: No acute osseous pathology. Diffuse soft tissue edema. Electronically Signed   By: Anner Crete M.D.   On: 03/26/2015 01:01   I have personally reviewed and evaluated these images as part of my medical decision-making.   MDM   Final diagnoses:  Foot contusion, right, initial encounter   [name] presents with right foot pain after injury sustained earlier today when he dropped a large piece of wood on his foot. Patient X-Ray negative for obvious fracture or dislocation.  Patient given ace wrap while in ED, conservative therapy recommended and discussed. Patient was offered crutches, however he states he would prefer to borrow a pair from a friend instead of paying for them here. The importance of primary care follow-up was stressed. Information was provided on primary care physician. Patient will be discharged home & is agreeable with above plan. Returns precautions discussed. Pt appears safe for discharge.   I personally performed the services described in this documentation, which was scribed in my presence. The recorded information  has been reviewed and is accurate.  [facility] Kasaundra Fahrney, PA-C 03/26/15 0132  Sherwood Gambler, MD 03/26/15 [phone]

## 2015-03-26 ENCOUNTER — Emergency Department (HOSPITAL_COMMUNITY): Payer: Self-pay

## 2015-03-26 MED ORDER — OXYCODONE-ACETAMINOPHEN 5-325 MG PO TABS: 1.0000 | ORAL_TABLET | Freq: Four times a day (QID) | ORAL | Status: DC | PRN

## 2015-03-26 MED ORDER — IBUPROFEN 800 MG PO TABS: 800.0000 mg | ORAL_TABLET | Freq: Three times a day (TID) | ORAL | Status: DC

## 2015-03-26 MED ORDER — OXYCODONE-ACETAMINOPHEN 5-325 MG PO TABS
1.0000 | ORAL_TABLET | Freq: Once | ORAL | Status: AC
  Administered 2015-03-26: 1 via ORAL
  Filled 2015-03-26: qty 1

## 2015-03-26 NOTE — Discharge Instructions (Signed)
1. Medications: Taken ibuprofen for mild to moderate pain and inflammation, narcotic pain medication is for severe pain only use this only as needed - This can make you very drowsy - please do not drink or drive on this medication, continue usual home medications 2. Treatment: rest, drink plenty of fluids, ice affected area (instructions below), elevate leg throughout the day to decrease swelling. 3. Follow Up: Please follow up with your primary doctor in 3-5 days for discussion of your diagnoses and further evaluation after today's visit; Please return to the ER for new or worsening symptoms, any additional concerns; please see below for information about pain management clinics in the area as we discussed.  COLD THERAPY DIRECTIONS:  Ice or gel packs can be used to reduce both pain and swelling. Ice is the most helpful within the first 24 to 48 hours after an injury or flareup from overusing a muscle or joint.  Ice is effective, has very few side effects, and is safe for most people to use.   If you expose your skin to cold temperatures for too long or without the proper protection, you can damage your skin or nerves. Watch for signs of skin damage due to cold.   HOME CARE INSTRUCTIONS  Follow these tips to use ice and cold packs safely.  Place a dry or damp towel between the ice and skin. A damp towel will cool the skin more quickly, so you may need to shorten the time that the ice is used.  For a more rapid response, add gentle compression to the ice.  Ice for no more than 10 to 20 minutes at a time. The bonier the area you are icing, the less time it will take to get the benefits of ice.  Check your skin after 5 minutes to make sure there are no signs of a poor response to cold or skin damage.  Rest 20 minutes or more in between uses.  Once your skin is numb, you can end your treatment. You can test numbness by very lightly touching your skin. The touch should be so light that you do not see the  skin dimple from the pressure of your fingertip. When using ice, most people will feel these normal sensations in this order: cold, burning, aching, and numbness.   Community Resource Guide Chronic Pain [employer] is a great source of information about community services available.  Access by dialing 2-1-1 from anywhere in [place], or by website -  [employer].   Other Local Resources (Updated 01/2015)   Chronic Pain   Services     Phone Number and Address  [facility]  Pain management is offered by board-certified physician specialists, physical therapists, occupational therapists, and other professionals  Support groups [phone] [address] [place], [place], [place] [address]  [facility] for Pain and Re[facility]itative  Medicine  Various pain management strategies are offered, including medication, acupuncture, and manual therapy  Must be referred by primary care doctor [phone] [address], [place]   [facility], [place]  Pain management clinic  Must be referred by primary care doctor [phone] [address], [place], [facility]  Pain management is offered by board-certified physician specialists [phone] [address] [place], [place] [address]

## 2015-04-14 ENCOUNTER — Encounter (HOSPITAL_COMMUNITY): Payer: Self-pay | Admitting: *Deleted

## 2015-04-14 DIAGNOSIS — I10 Essential (primary) hypertension: Secondary | ICD-10-CM | POA: Insufficient documentation

## 2015-04-14 DIAGNOSIS — Z87891 Personal history of nicotine dependence: Secondary | ICD-10-CM | POA: Insufficient documentation

## 2015-04-14 DIAGNOSIS — M7989 Other specified soft tissue disorders: Secondary | ICD-10-CM | POA: Insufficient documentation

## 2015-04-14 DIAGNOSIS — Z8719 Personal history of other diseases of the digestive system: Secondary | ICD-10-CM | POA: Insufficient documentation

## 2015-04-14 DIAGNOSIS — R600 Localized edema: Secondary | ICD-10-CM | POA: Insufficient documentation

## 2015-04-14 NOTE — ED Notes (Signed)
Pt c/o bilat leg swelling x 1 week. Swelling is noted to legs, worse to right leg. Reports history of the same before.

## 2015-04-15 ENCOUNTER — Emergency Department (HOSPITAL_COMMUNITY)
Admission: EM | Admit: 2015-04-15 | Discharge: 2015-04-15 | Disposition: A | Discharge status: Home / Self Care | Payer: Self-pay | Attending: Emergency Medicine | Admitting: Emergency Medicine

## 2015-04-15 DIAGNOSIS — M7989 Other specified soft tissue disorders: Secondary | ICD-10-CM

## 2015-04-15 MED ORDER — FUROSEMIDE 20 MG PO TABS: 20.0000 mg | ORAL_TABLET | Freq: Every day | ORAL | Status: DC | PRN

## 2015-04-15 NOTE — ED Provider Notes (Signed)
CSN: [id number]     Arrival date & time 04/14/15  2213 History  By signing my name below, I, [name], attest that this documentation has been prepared under the direction and in the presence of Darren Pew, MD. Electronically Signed: Hansel Perez, ED Scribe. 04/15/2015. 3:05 AM.    Chief Complaint  Patient presents with  . Leg Swelling   The history is provided by the patient. No language interpreter was used.   HPI Comments: [name] is a 65 y.o. male with h/o HTN who presents to the Emergency Department complaining of moderate, unchanged bilateral feet and leg swelling onset a week ago with associated tenderness. Per pt, his swelling is worsened with prolonged activity. He reports that the swelling is alleviated with elevation and rest. Pt states h/o similar swelling and was told he had a build up of fluid. Pt notes that he has previously taken Lasix for this issue with relief of swelling. H/o skin graft to the right calf. Pt is not currently followed by a PCP. No h/o PE/DVT. He denies SOB, cough, CP, back pain, nausea, emesis, fever.   Past Medical History  Diagnosis Date  . Hypertension   . Hernia    Past Surgical History  Procedure Laterality Date  . Pelvic fracture surgery     No family history on file. Social History  Substance Use Topics  . Smoking status: Former Research scientist (life sciences)  . Smokeless tobacco: None  . Alcohol Use: Yes     Comment: ocassionally    Review of Systems  Constitutional: Negative for fever.  Respiratory: Negative for cough and shortness of breath.   Cardiovascular: Positive for leg swelling. Negative for chest pain.  Gastrointestinal: Negative for nausea, vomiting and abdominal pain.  Musculoskeletal: Positive for myalgias. Negative for back pain.  All other systems reviewed and are negative.  Allergies  Review of patient's allergies indicates no known allergies.  Home Medications   Prior to Admission medications   Medication Sig Start Date End Date  Taking? Authorizing Provider  furosemide (LASIX) 20 MG tablet Take 1 tablet (20 mg total) by mouth daily as needed for edema. 04/15/15   Corene Cornea Tiffanee Mcnee, MD   BP 145/91 mmHg  Pulse 67  Temp(Src) 97.7 F (36.5 C) (Oral)  Resp 16  SpO2 100% Physical Exam  Constitutional: He is oriented to person, place, and time. He appears well-developed and well-nourished.  HENT:  Head: Normocephalic and atraumatic.  Eyes: Conjunctivae and EOM are normal. Pupils are equal, round, and reactive to light.  Neck: Normal range of motion. Neck supple.  Cardiovascular: Normal rate, regular rhythm and normal heart sounds.  Exam reveals no gallop and no friction rub.   No murmur heard. Pulmonary/Chest: Effort normal and breath sounds normal. No respiratory distress. He has no wheezes. He has no rales.  Abdominal: Soft. Bowel sounds are normal. He exhibits no distension and no mass. There is no tenderness. There is no rebound and no guarding.  Musculoskeletal: Normal range of motion. He exhibits edema.  Old skin grafting to the posterior aspect of the right lower extremity with 1+ pitting edema surrounding it extending into the foot. Doppler DP pulses bilaterally. Doppler PT pulses bilaterally.   Neurological: He is alert and oriented to person, place, and time.  Skin: Skin is warm and dry.  Psychiatric: He has a normal mood and affect. His behavior is normal.  Nursing note and vitals reviewed.   ED Course  Procedures (including critical care time) DIAGNOSTIC STUDIES: Oxygen Saturation  is 99% on RA, normal by my interpretation.    COORDINATION OF CARE: 2:59 AM Discussed treatment plan with pt at bedside which includes PCP follow up and pt agreed to plan.   MDM   Final diagnoses:  Swelling of lower extremity    BLE edema without obvious cause. No h/o heart disease. Better in the mornings. Exam as above. No dyspnea, orthopnea to suggest heart failure as cause. Lungs clear. Likely venous stasis. May need  compression hose, however will need PCP to accomplish this. Has had success with lasix in past, will try same. Ibuprofen for pain.   New Prescriptions: New Prescriptions   FUROSEMIDE (LASIX) 20 MG TABLET    Take 1 tablet (20 mg total) by mouth daily as needed for edema.     I have personally and contemperaneously reviewed labs and imaging and used in my decision making as above.   A medical screening exam was performed and I feel the patient has had an appropriate workup for their chief complaint at this time and likelihood of emergent condition existing is low. Their vital signs are stable. They have been counseled on decision, discharge, follow up and which symptoms necessitate immediate return to the emergency department.  They verbally stated understanding and agreement with plan and discharged in stable condition.   I personally performed the services described in this documentation, which was scribed in my presence. The recorded information has been reviewed and is accurate.    Darren Pew, MD 04/15/15 [phone]

## 2015-04-15 NOTE — ED Notes (Signed)
EDP at bedside  

## 2015-06-25 ENCOUNTER — Encounter (HOSPITAL_COMMUNITY): Payer: Self-pay | Admitting: *Deleted

## 2015-06-25 ENCOUNTER — Emergency Department (HOSPITAL_COMMUNITY)
Admission: EM | Admit: 2015-06-25 | Discharge: 2015-06-25 | Disposition: A | Discharge status: Home / Self Care | Payer: Self-pay | Attending: Emergency Medicine | Admitting: Emergency Medicine

## 2015-06-25 DIAGNOSIS — K469 Unspecified abdominal hernia without obstruction or gangrene: Secondary | ICD-10-CM | POA: Insufficient documentation

## 2015-06-25 DIAGNOSIS — E876 Hypokalemia: Secondary | ICD-10-CM

## 2015-06-25 DIAGNOSIS — E875 Hyperkalemia: Secondary | ICD-10-CM | POA: Insufficient documentation

## 2015-06-25 DIAGNOSIS — K439 Ventral hernia without obstruction or gangrene: Secondary | ICD-10-CM

## 2015-06-25 DIAGNOSIS — Z79899 Other long term (current) drug therapy: Secondary | ICD-10-CM | POA: Insufficient documentation

## 2015-06-25 DIAGNOSIS — Z87891 Personal history of nicotine dependence: Secondary | ICD-10-CM | POA: Insufficient documentation

## 2015-06-25 DIAGNOSIS — E1165 Type 2 diabetes mellitus with hyperglycemia: Secondary | ICD-10-CM | POA: Insufficient documentation

## 2015-06-25 DIAGNOSIS — I10 Essential (primary) hypertension: Secondary | ICD-10-CM | POA: Insufficient documentation

## 2015-06-25 LAB — CBC
HCT: 33 % — ABNORMAL LOW (ref 39.0–52.0)
Hemoglobin: 10.3 g/dL — ABNORMAL LOW (ref 13.0–17.0)
MCH: 24.6 pg — AB (ref 26.0–34.0)
MCHC: 31.2 g/dL (ref 30.0–36.0)
MCV: 78.8 fL (ref 78.0–100.0)
PLATELETS: DECREASED 10*3/uL (ref 150–400)
RBC: 4.19 MIL/uL — AB (ref 4.22–5.81)
RDW: 18.9 % — ABNORMAL HIGH (ref 11.5–15.5)
WBC: 5.3 10*3/uL (ref 4.0–10.5)

## 2015-06-25 LAB — BASIC METABOLIC PANEL
ANION GAP: 9 (ref 5–15)
BUN: 5 mg/dL — ABNORMAL LOW (ref 6–20)
CALCIUM: 8.3 mg/dL — AB (ref 8.9–10.3)
CO2: 21 mmol/L — ABNORMAL LOW (ref 22–32)
CREATININE: 1.04 mg/dL (ref 0.61–1.24)
Chloride: 104 mmol/L (ref 101–111)
GFR calc Af Amer: >60 mL/min (ref >60)
Glucose, Bld: 207 mg/dL — ABNORMAL HIGH (ref 65–99)
Potassium: 2.9 mmol/L — ABNORMAL LOW (ref 3.5–5.1)
Sodium: 134 mmol/L — ABNORMAL LOW (ref 135–145)

## 2015-06-25 LAB — URINALYSIS, ROUTINE W REFLEX MICROSCOPIC
Glucose, UA: 100 mg/dL — AB
HGB URINE DIPSTICK: NEGATIVE
Ketones, ur: 15 mg/dL — AB
LEUKOCYTES UA: NEGATIVE
NITRITE: NEGATIVE
PROTEIN: NEGATIVE mg/dL
Specific Gravity, Urine: 1.022 (ref 1.005–1.030)
pH: 5.5 (ref 5.0–8.0)

## 2015-06-25 LAB — CBG MONITORING, ED: GLUCOSE-CAPILLARY: 206 mg/dL — AB (ref 65–99)

## 2015-06-25 LAB — MAGNESIUM: MAGNESIUM: 1.7 mg/dL (ref 1.7–2.4)

## 2015-06-25 MED ORDER — POTASSIUM CHLORIDE CRYS ER 20 MEQ PO TBCR: 40.0000 meq | EXTENDED_RELEASE_TABLET | Freq: Two times a day (BID) | ORAL | Status: DC

## 2015-06-25 MED ORDER — POTASSIUM CHLORIDE CRYS ER 20 MEQ PO TBCR
40.0000 meq | EXTENDED_RELEASE_TABLET | Freq: Once | ORAL | Status: AC
  Administered 2015-06-25: 40 meq via ORAL
  Filled 2015-06-25: qty 2

## 2015-06-25 MED ORDER — IBUPROFEN 600 MG PO TABS: 600.0000 mg | ORAL_TABLET | Freq: Four times a day (QID) | ORAL | Status: DC | PRN

## 2015-06-25 NOTE — ED Notes (Addendum)
Pt states he's been checking his blood sugar because he's felt dizzy and going to pass out and it has been in 40's.  Pt states he has diabetes but he takes nothing for it.  States LE pain and edema.  CBG 206 in triage (states ate 1 hour ago).  Also states his umbilical hernia has been bothering him.

## 2015-06-25 NOTE — ED Notes (Signed)
Patient Alert and oriented X4. Stable and ambulatory. Patient verbalized understanding of the discharge instructions.  Patient belongings were taken by the patient.  

## 2015-06-25 NOTE — ED Provider Notes (Signed)
CSN: [id number]     Arrival date & time 06/25/15  1401 History   First MD Initiated Contact with Patient 06/25/15 1631     Chief Complaint  Patient presents with  . Hypoglycemia  . Abdominal Pain     (Consider location/radiation/quality/duration/timing/severity/associated sxs/prior Treatment) Patient is a 65 y.o. male presenting with weakness. The history is provided by the patient.  Weakness This is a new problem. The current episode started 2 days ago. The problem occurs constantly. The problem has not changed since onset.Associated symptoms include abdominal pain (intermittent discomfort over hernia). Nothing aggravates the symptoms. Nothing relieves the symptoms. He has tried nothing for the symptoms.    Past Medical History  Diagnosis Date  . Hypertension   . Hernia   . Diabetes mellitus without complication [facility])    Past Surgical History  Procedure Laterality Date  . Pelvic fracture surgery     No family history on file. Social History  Substance Use Topics  . Smoking status: Former Research scientist (life sciences)  . Smokeless tobacco: None  . Alcohol Use: Yes     Comment: ocassionally    Review of Systems  Gastrointestinal: Positive for abdominal pain (intermittent discomfort over hernia).  Neurological: Positive for weakness.  All other systems reviewed and are negative.     Allergies  Review of patient's allergies indicates no known allergies.  Home Medications   Prior to Admission medications   Medication Sig Start Date End Date Taking? Authorizing Provider  furosemide (LASIX) 20 MG tablet Take 1 tablet (20 mg total) by mouth daily as needed for edema. 04/15/15   Corene Cornea Mesner, MD   BP 146/90 mmHg  Pulse 84  Temp(Src) 97.7 F (36.5 C) (Oral)  Resp 24  Ht 5\' 11"  (1.803 m)  Wt 207 lb 6 oz (94.065 kg)  BMI 28.94 kg/m2  SpO2 97% Physical Exam  Constitutional: He is oriented to person, place, and time. He appears well-developed and well-nourished. No distress.  HENT:  Head:  Normocephalic and atraumatic.  Eyes: Conjunctivae are normal.  Neck: Neck supple. No tracheal deviation present.  Cardiovascular: Normal rate, regular rhythm and normal heart sounds.   Pulmonary/Chest: Effort normal and breath sounds normal. No respiratory distress.  Abdominal: Soft. He exhibits no distension. There is no tenderness. There is no rebound and no guarding.  Neurological: He is alert and oriented to person, place, and time. He has normal strength. Coordination and gait normal. GCS eye subscore is 4. GCS verbal subscore is 5. GCS motor subscore is 6.  Skin: Skin is warm and dry.  Psychiatric: He has a normal mood and affect.  Vitals reviewed.   ED Course  Procedures (including critical care time) Labs Review Labs Reviewed  BASIC METABOLIC PANEL - Abnormal; Notable for the following:    Sodium 134 (*)    Potassium 2.9 (*)    CO2 21 (*)    Glucose, Bld 207 (*)    BUN 5 (*)    Calcium 8.3 (*)    All other components within normal limits  CBC - Abnormal; Notable for the following:    RBC 4.19 (*)    Hemoglobin 10.3 (*)    HCT 33.0 (*)    MCH 24.6 (*)    RDW 18.9 (*)    All other components within normal limits  URINALYSIS, ROUTINE W REFLEX MICROSCOPIC (NOT AT [facility]) - Abnormal; Notable for the following:    Color, Urine AMBER (*)    Glucose, UA 100 (*)    Bilirubin  Urine SMALL (*)    Ketones, ur 15 (*)    All other components within normal limits  CBG MONITORING, ED - Abnormal; Notable for the following:    Glucose-Capillary 206 (*)    All other components within normal limits  MAGNESIUM  CBG MONITORING, ED    Imaging Review No results found. I have personally reviewed and evaluated these images and lab results as part of my medical decision-making.   EKG Interpretation   Date/Time:  Friday June 25 2015 14:30:39 EDT Ventricular Rate:  87 PR Interval:  136 QRS Duration: 106 QT Interval:  438 QTC Calculation: 527 R Axis:   20 Text Interpretation:  Sinus  rhythm with occasional Premature ventricular  complexes Prolonged QT Abnormal ECG No significant change since last  tracing Confirmed by Pranav Lince MD, Quillian Quince [id number]) on 06/25/2015 4:31:41 PM      MDM   Final diagnoses:  Hernia of abdominal wall  Acute hypokalemia   65 y.o. male presents with feeling of weakness and concern that his blood sugar has been low intermittently. Measured it at home at 7, not on any hypoglycemic agents. Not hypoglycemic here despite ongoing symptoms, notable hypokalemia which was repleted here with plan to continue repletion PO at home. Patient needs to establish primary care in the area and was provided contact information to do so. Return precautions discussed for worsening or new concerning symptoms. Secondary complaint of abdominal wall hernia that bothers him occasionally but no signs of entrapment or strangulation.     Leo Grosser, MD 06/26/15 0230

## 2015-06-25 NOTE — Discharge Instructions (Signed)

## 2015-08-30 ENCOUNTER — Ambulatory Visit (HOSPITAL_COMMUNITY)
Admission: EM | Admit: 2015-08-30 | Discharge: 2015-08-30 | Disposition: A | Discharge status: Home / Self Care | Payer: Self-pay | Attending: Emergency Medicine | Admitting: Emergency Medicine

## 2015-08-30 ENCOUNTER — Encounter (HOSPITAL_COMMUNITY): Payer: Self-pay | Admitting: Emergency Medicine

## 2015-08-30 DIAGNOSIS — M5442 Lumbago with sciatica, left side: Secondary | ICD-10-CM

## 2015-08-30 MED ORDER — GABAPENTIN 300 MG PO CAPS: 300.0000 mg | ORAL_CAPSULE | Freq: Every day | ORAL | 0 refills | Status: DC

## 2015-08-30 MED ORDER — HYDROCODONE-ACETAMINOPHEN 5-325 MG PO TABS: 1.0000 | ORAL_TABLET | Freq: Once | ORAL | Status: DC

## 2015-08-30 MED ORDER — HYDROCODONE-ACETAMINOPHEN 5-325 MG PO TABS
ORAL_TABLET | ORAL | Status: AC
  Filled 2015-08-30: qty 1

## 2015-08-30 MED ORDER — PREDNISONE 50 MG PO TABS: ORAL_TABLET | ORAL | 0 refills | Status: DC

## 2015-08-30 MED ORDER — HYDROCODONE-ACETAMINOPHEN 5-325 MG PO TABS: 1.0000 | ORAL_TABLET | ORAL | 0 refills | Status: DC | PRN
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
## 2015-08-30 NOTE — Discharge Instructions (Signed)
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 Take prednisone daily for 5 days. Take this with food. Take gabapentin at bedtime. This medicine will make you drowsy. Use the hydrocodone every 4-6 hours as needed for severe pain. Follow-up with orthopedics. Contact information for Dr. Percell Miller has been provided.

## 2015-08-30 NOTE — ED Provider Notes (Signed)
[name]    CSN: [id number] Arrival date & time: 08/30/15  1825  First Provider Contact:  First MD Initiated Contact with Patient 08/30/15 1951        History   Chief Complaint Chief Complaint  Patient presents with  . Hip Pain    HPI [name] is a 65 y.o. male.   He is a 65 year old man here for evaluation of left back pain and hip pain. He has a history of pelvic fracture about 20 years ago. He has had some chronic pain since then, but has been worse in the last several days. He reports pain in the left lower back that shoots down the left leg. It makes it difficult for him to walk. He denies numbness or tingling. No weakness. No recent injury or trauma, but he does work as part of a Adult nurse. He has taken ibuprofen, but states he is unable to tolerate it. It causes upset stomach and dark stools.     Past Medical History:  Diagnosis Date  . Diabetes mellitus without complication ([place])   . Hernia   . Hypertension     There are no active problems to display for this patient.   Past Surgical History:  Procedure Laterality Date  . PELVIC FRACTURE SURGERY         Home Medications    Prior to Admission medications   Medication Sig Start Date End Date Taking? Authorizing Provider  furosemide (LASIX) 20 MG tablet Take 1 tablet (20 mg total) by mouth daily as needed for edema. 04/15/15   [name], MD  gabapentin (NEURONTIN) 300 MG capsule Take 1 capsule (300 mg total) by mouth at bedtime. 08/30/15   Melony Overly, MD  HYDROcodone-acetaminophen (NORCO) 5-325 MG tablet Take 1 tablet by mouth every 4 (four) hours as needed for severe pain. 08/30/15   Melony Overly, MD  potassium chloride SA (K-DUR,KLOR-CON) 20 MEQ tablet Take 2 tablets (40 mEq total) by mouth 2 (two) times daily. 06/25/15   Leo Grosser, MD  predniSONE (DELTASONE) 50 MG tablet Take 1 pill daily for 5 days. 08/30/15   Melony Overly, MD    Family History History reviewed. No pertinent family  history.  Social History Social History  Substance Use Topics  . Smoking status: Former Research scientist (life sciences)  . Smokeless tobacco: Not on file  . Alcohol use Yes     Comment: ocassionally     Allergies   Review of patient's allergies indicates no known allergies.   Review of Systems Review of Systems  Musculoskeletal: Positive for back pain.       Leg pain     Physical Exam Triage Vital Signs ED Triage Vitals  Enc Vitals Group     BP 08/30/15 1941 143/88     Pulse Rate 08/30/15 1941 75     Resp 08/30/15 1941 18     Temp 08/30/15 1941 98.3 F (36.8 C)     Temp Source 08/30/15 1941 Oral     SpO2 08/30/15 1941 98 %     Weight --      Height --      Head Circumference --      Peak Flow --      Pain Score 08/30/15 1950 10     Pain Loc --      Pain Edu? --      Excl. in [place]? --    No data found.   Updated Vital Signs BP 143/88 (BP  Location: Left Arm)   Pulse 75   Temp 98.3 F (36.8 C) (Oral)   Resp 18   SpO2 98%   Visual Acuity Right Eye Distance:   Left Eye Distance:   Bilateral Distance:    Right Eye Near:   Left Eye Near:    Bilateral Near:     Physical Exam  Constitutional: He is oriented to person, place, and time. He appears well-developed and well-nourished. No distress.  Cardiovascular: Normal rate.   Pulmonary/Chest: Effort normal.  Musculoskeletal:  Back: Positive straight leg raise on the left. He is able to ambulate, but with a slight limp. 5 out of 5 strength in left lower extremity.  Neurological: He is alert and oriented to person, place, and time.     UC Treatments / Results  Labs (all labs ordered are listed, but only abnormal results are displayed) Labs Reviewed - No data to display  EKG  EKG Interpretation None       Radiology No results found.  Procedures Procedures (including critical care time)  Medications Ordered in UC Medications  HYDROcodone-acetaminophen (NORCO/VICODIN) 5-325 MG per tablet 1 tablet (not administered)       Initial Impression / Assessment and Plan / UC Course  I have reviewed the triage vital signs and the nursing notes.  Pertinent labs & imaging results that were available during my care of the patient were reviewed by me and considered in my medical decision making (see chart for details).  Clinical Course    We'll treat with a five-day course of prednisone. Prescription for gabapentin. Hydrocodone to use as needed for severe pain. Recommended follow-up with orthopedics.  Final Clinical Impressions(s) / UC Diagnoses   Final diagnoses:  Left-sided low back pain with left-sided sciatica    New Prescriptions Discharge Medication List as of 08/30/2015  8:21 PM    START taking these medications   Details  gabapentin (NEURONTIN) 300 MG capsule Take 1 capsule (300 mg total) by mouth at bedtime., Starting Mon 08/30/2015, Print    HYDROcodone-acetaminophen (NORCO) 5-325 MG tablet Take 1 tablet by mouth every 4 (four) hours as needed for severe pain., Starting Mon 08/30/2015, Print    predniSONE (DELTASONE) 50 MG tablet Take 1 pill daily for 5 days., Print         Melony Overly, MD 08/30/15 2035

## 2015-10-08 ENCOUNTER — Encounter (HOSPITAL_COMMUNITY): Payer: Self-pay

## 2015-10-08 DIAGNOSIS — I10 Essential (primary) hypertension: Secondary | ICD-10-CM | POA: Insufficient documentation

## 2015-10-08 DIAGNOSIS — E119 Type 2 diabetes mellitus without complications: Secondary | ICD-10-CM | POA: Insufficient documentation

## 2015-10-08 DIAGNOSIS — G8929 Other chronic pain: Secondary | ICD-10-CM | POA: Insufficient documentation

## 2015-10-08 DIAGNOSIS — M5441 Lumbago with sciatica, right side: Secondary | ICD-10-CM | POA: Insufficient documentation

## 2015-10-08 DIAGNOSIS — Z79899 Other long term (current) drug therapy: Secondary | ICD-10-CM | POA: Insufficient documentation

## 2015-10-08 DIAGNOSIS — Z87891 Personal history of nicotine dependence: Secondary | ICD-10-CM | POA: Insufficient documentation
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
## 2015-10-09 ENCOUNTER — Encounter (HOSPITAL_COMMUNITY): Payer: Self-pay | Admitting: Emergency Medicine

## 2015-10-09 ENCOUNTER — Emergency Department (HOSPITAL_COMMUNITY)
Admission: EM | Admit: 2015-10-09 | Discharge: 2015-10-09 | Disposition: A | Discharge status: Home / Self Care | Payer: Self-pay | Attending: Emergency Medicine | Admitting: Emergency Medicine

## 2015-10-09 ENCOUNTER — Emergency Department (HOSPITAL_COMMUNITY): Payer: Self-pay

## 2015-10-09 DIAGNOSIS — G8929 Other chronic pain: Secondary | ICD-10-CM

## 2015-10-09 DIAGNOSIS — M5431 Sciatica, right side: Secondary | ICD-10-CM

## 2015-10-09 DIAGNOSIS — M549 Dorsalgia, unspecified: Secondary | ICD-10-CM

## 2015-10-09 LAB — URINALYSIS, ROUTINE W REFLEX MICROSCOPIC
Bilirubin Urine: NEGATIVE
GLUCOSE, UA: NEGATIVE mg/dL
Hgb urine dipstick: NEGATIVE
Ketones, ur: NEGATIVE mg/dL
LEUKOCYTES UA: NEGATIVE
NITRITE: NEGATIVE
PROTEIN: NEGATIVE mg/dL
Specific Gravity, Urine: 1.003 — ABNORMAL LOW (ref 1.005–1.030)
pH: 5.5 (ref 5.0–8.0)

## 2015-10-09 LAB — RAPID URINE DRUG SCREEN, HOSP PERFORMED
AMPHETAMINES: NOT DETECTED
BENZODIAZEPINES: NOT DETECTED
Barbiturates: NOT DETECTED
COCAINE: NOT DETECTED
OPIATES: POSITIVE — AB
Tetrahydrocannabinol: NOT DETECTED

## 2015-10-09 MED ORDER — METHOCARBAMOL 500 MG PO TABS: 500.0000 mg | ORAL_TABLET | Freq: Two times a day (BID) | ORAL | 0 refills | Status: DC

## 2015-10-09 MED ORDER — METHOCARBAMOL 500 MG PO TABS
1000.0000 mg | ORAL_TABLET | Freq: Once | ORAL | Status: AC
Start: 2015-10-09 — End: 2015-10-09
  Administered 2015-10-09: 1000 mg via ORAL
  Filled 2015-10-09: qty 2

## 2015-10-09 MED ORDER — DEXAMETHASONE SODIUM PHOSPHATE 10 MG/ML IJ SOLN
10.0000 mg | Freq: Once | INTRAMUSCULAR | Status: AC
  Administered 2015-10-09: 10 mg via INTRAMUSCULAR
  Filled 2015-10-09: qty 1

## 2015-10-09 MED ORDER — DICLOFENAC SODIUM ER 100 MG PO TB24: 100.0000 mg | ORAL_TABLET | Freq: Every day | ORAL | 0 refills | Status: DC

## 2015-10-09 MED ORDER — LIDOCAINE 5 % EX PTCH: 1.0000 | MEDICATED_PATCH | CUTANEOUS | 0 refills | Status: DC

## 2015-10-09 MED ORDER — KETOROLAC TROMETHAMINE 60 MG/2ML IM SOLN
60.0000 mg | Freq: Once | INTRAMUSCULAR | Status: AC
  Administered 2015-10-09: 60 mg via INTRAMUSCULAR
  Filled 2015-10-09: qty 2

## 2015-10-09 NOTE — ED Provider Notes (Signed)
[facility] DEPT Provider Note   CSN: [id number] Arrival date & time: 10/08/15  2249  By signing my name below, I, [name] and Johnney Killian, attest that this documentation has been prepared under the direction and in the presence of Arlean Thies, MD. Electronically Signed: [name] and Johnney Killian, ED Scribe. 10/09/15. 4:40 AM.     History   Chief Complaint Chief Complaint  Patient presents with  . Back Pain  . Leg Pain    HPI Comments: [name] is a 65 y.o. male with h/o pelvic fracture surgery who presents to the Emergency Department complaining of moderate lower, burning lower back pain with radiation to BLE onset 3 weeks ago. Pt also complains of dysuria and frequency for 2 weeks. He reports that a small tree limb struck his RLE while he was working in the yard in the last week, but denies additional trauma, falls or injuries. He reports that he has taken ibuprofen with no relief of pain. He reports his pain is worsened with movement and in certain positions. He denies malodorous urine, hematuria, discolored urine, sensation of incomplete voiding, constipation, diarrhea, bowel or bladder incontinence.  Denies being on opioid pain medication.    The history is provided by the patient. No language interpreter was used.  Back Pain   This is a new problem. The current episode started more than 1 week ago. The problem occurs constantly. The problem has not changed since onset.The pain is associated with no known injury. The pain is present in the lumbar spine. The quality of the pain is described as burning. The pain radiates to the left thigh and right thigh. The pain is moderate. The symptoms are aggravated by certain positions. The pain is the same all the time. Associated symptoms include dysuria and leg pain. Pertinent negatives include no perianal numbness. He has tried NSAIDs for the symptoms. The treatment provided no relief. Risk factors: hx of pelvic fracture  surgery.  Leg Pain      Past Medical History:  Diagnosis Date  . Diabetes mellitus without complication ([place])   . Hernia   . Hypertension     There are no active problems to display for this patient.   Past Surgical History:  Procedure Laterality Date  . PELVIC FRACTURE SURGERY         Home Medications    Prior to Admission medications   Medication Sig Start Date End Date Taking? Authorizing Provider  furosemide (LASIX) 20 MG tablet Take 1 tablet (20 mg total) by mouth daily as needed for edema. 04/15/15   [name], MD  gabapentin (NEURONTIN) 300 MG capsule Take 1 capsule (300 mg total) by mouth at bedtime. 08/30/15   Melony Overly, MD  HYDROcodone-acetaminophen (NORCO) 5-325 MG tablet Take 1 tablet by mouth every 4 (four) hours as needed for severe pain. 08/30/15   Melony Overly, MD  potassium chloride SA (K-DUR,KLOR-CON) 20 MEQ tablet Take 2 tablets (40 mEq total) by mouth 2 (two) times daily. 06/25/15   Leo Grosser, MD  predniSONE (DELTASONE) 50 MG tablet Take 1 pill daily for 5 days. 08/30/15   Melony Overly, MD    Family History History reviewed. No pertinent family history.  Social History Social History  Substance Use Topics  . Smoking status: Former Research scientist (life sciences)  . Smokeless tobacco: Never Used  . Alcohol use Yes     Comment: ocassionally     Allergies   Review of patient's allergies indicates no known allergies.  Review of Systems Review of Systems  Gastrointestinal: Negative for constipation and diarrhea.  Genitourinary: Positive for dysuria and frequency. Negative for hematuria.  Musculoskeletal: Positive for back pain.  All other systems reviewed and are negative.    Physical Exam Updated Vital Signs BP 138/91 (BP Location: Left Arm)   Pulse 66   Temp 97.6 F (36.4 C) (Oral)   Resp 18   SpO2 100%   Physical Exam  Constitutional: He appears well-developed and well-nourished.  HENT:  Head: Normocephalic and atraumatic.  Mouth/Throat: No  oropharyngeal exudate.  Moist mucous membranes. No exudates.   Eyes: Conjunctivae are normal. Pupils are equal, round, and reactive to light.  Neck: Normal range of motion. Neck supple.  Cardiovascular: Normal rate and regular rhythm.   No murmur heard. Intact DP pulses  Pulmonary/Chest: Effort normal and breath sounds normal. No respiratory distress.  Abdominal: Soft. Bowel sounds are normal. There is no tenderness.  Musculoskeletal: He exhibits no edema.  All compartment soft No stepoffs or crepitus to C, T, L or S spine.  Well healed surgical scar Grafting on RLE.  Intact achilles tendon   Neurological: He is alert. He has normal reflexes.  Achilles tendon reflex intact Perineal sensation intact  Skin: Skin is warm and dry. Capillary refill takes 2 to 3 seconds.  Psychiatric: He has a normal mood and affect.  Nursing note and vitals reviewed.    ED Treatments / Results   DIAGNOSTIC STUDIES: Oxygen Saturation is 100% on RA, normal by my interpretation.    COORDINATION OF CARE: 3:37 AM Discussed treatment plan with pt at bedside which includes lab work, XR and pt agreed to plan.    Labs (all labs ordered are listed, but only abnormal results are displayed) Labs Reviewed - No data to display  EKG  EKG Interpretation None       Radiology No results found.  Procedures Procedures (including critical care time)  Medications Ordered in ED Medications - No data to display  Vitals:   10/09/15 0430 10/09/15 0445  BP: 111/70   Pulse: (!) 52 63  Resp:    Temp:     Results for orders placed or performed during the hospital encounter of 10/09/15  Urinalysis, Routine w reflex microscopic (not at [facility])  Result Value Ref Range   Color, Urine YELLOW YELLOW   APPearance CLEAR CLEAR   Specific Gravity, Urine 1.003 (L) 1.005 - 1.030   pH 5.5 5.0 - 8.0   Glucose, UA NEGATIVE NEGATIVE mg/dL   Hgb urine dipstick NEGATIVE NEGATIVE   Bilirubin Urine NEGATIVE NEGATIVE    Ketones, ur NEGATIVE NEGATIVE mg/dL   Protein, ur NEGATIVE NEGATIVE mg/dL   Nitrite NEGATIVE NEGATIVE   Leukocytes, UA NEGATIVE NEGATIVE  Rapid urine drug screen (hospital performed)  Result Value Ref Range   Opiates POSITIVE (A) NONE DETECTED   Cocaine NONE DETECTED NONE DETECTED   Benzodiazepines NONE DETECTED NONE DETECTED   Amphetamines NONE DETECTED NONE DETECTED   Tetrahydrocannabinol NONE DETECTED NONE DETECTED   Barbiturates NONE DETECTED NONE DETECTED   Dg Lumbar Spine Complete  Result Date: 10/09/2015 CLINICAL DATA:  Progressive chronic lumbosacral back pain. EXAM: LUMBAR SPINE - COMPLETE 4+ VIEW COMPARISON:  CT 12/07/2014 FINDINGS: Two screws fixate each sacroiliac joint, unchanged alignment from prior CT. Mild broad-based leftward curvature. Bilateral L5 pars defects with grade 1 anterolisthesis of L5 on S1. Endplate spurring at 624THL. Vertebral body heights are maintained. Mild disc space narrowing at T11-T12 and L4-L5, remaining disc spaces  are preserved. Facet arthropathy at L4-L5. IMPRESSION: 1. 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 2. Scattered degenerative change elsewhere. No acute bony abnormality. Electronically Signed   By: Jeb Levering M.D.   On: 10/09/2015 04:14    Medications  ketorolac (TORADOL) injection 60 mg (60 mg Intramuscular Given 10/09/15 0336)  dexamethasone (DECADRON) injection 10 mg (10 mg Intramuscular Given 10/09/15 0337)  methocarbamol (ROBAXIN) tablet 1,000 mg (1,000 mg Oral Given 10/09/15 0336)    Initial Impression / Assessment and Plan / ED Course  I have reviewed the triage vital signs and the nursing notes.  Pertinent labs & imaging results that were available during my care of the patient were reviewed by me and considered in my medical decision making (see chart for details).   Patient states he is not on opioids but has opioids in his urine and asks multiple times for oxycodone for his  chronic pain.  I have told him he will need to follow up with the patient who has been doing his pain management as he stated he was not on opioids but clearly is.  None of the symptoms are acute and the pain in his legs is at his graft site which is nearly 63 years old.    Final Clinical Impressions(s) / ED Diagnoses   Final diagnoses:  None    New Prescriptions New Prescriptions   No medications on file  All questions answered to patient's satisfaction. Based on history and exam patient has been appropriately medically screened and emergency conditions excluded. Patient is stable for discharge at this time. Follow up with your PMD for recheck in 2 days and strict return precautions given  I personally performed the services described in this documentation, which was scribed in my presence. The recorded information has been reviewed and is accurate.      Veatrice Kells, MD 10/09/15 [phone]

## 2015-11-18 ENCOUNTER — Emergency Department (HOSPITAL_COMMUNITY)
Admission: EM | Admit: 2015-11-18 | Discharge: 2015-11-18 | Disposition: A | Discharge status: Home / Self Care | Payer: Self-pay | Attending: Emergency Medicine | Admitting: Emergency Medicine

## 2015-11-18 ENCOUNTER — Encounter (HOSPITAL_COMMUNITY): Payer: Self-pay

## 2015-11-18 DIAGNOSIS — I10 Essential (primary) hypertension: Secondary | ICD-10-CM | POA: Insufficient documentation

## 2015-11-18 DIAGNOSIS — K429 Umbilical hernia without obstruction or gangrene: Secondary | ICD-10-CM | POA: Insufficient documentation

## 2015-11-18 DIAGNOSIS — E119 Type 2 diabetes mellitus without complications: Secondary | ICD-10-CM | POA: Insufficient documentation

## 2015-11-18 DIAGNOSIS — Z87891 Personal history of nicotine dependence: Secondary | ICD-10-CM | POA: Insufficient documentation

## 2015-11-18 LAB — COMPREHENSIVE METABOLIC PANEL
ALBUMIN: 2.6 g/dL — AB (ref 3.5–5.0)
ALK PHOS: 85 U/L (ref 38–126)
ALT: 32 U/L (ref 17–63)
ANION GAP: 4 — AB (ref 5–15)
AST: 64 U/L — ABNORMAL HIGH (ref 15–41)
BILIRUBIN TOTAL: 1.3 mg/dL — AB (ref 0.3–1.2)
BUN: 7 mg/dL (ref 6–20)
CALCIUM: 8.5 mg/dL — AB (ref 8.9–10.3)
CO2: 25 mmol/L (ref 22–32)
Chloride: 109 mmol/L (ref 101–111)
Creatinine, Ser: 0.77 mg/dL (ref 0.61–1.24)
GFR calc non Af Amer: >60 mL/min (ref >60)
Glucose, Bld: 102 mg/dL — ABNORMAL HIGH (ref 65–99)
POTASSIUM: 3.6 mmol/L (ref 3.5–5.1)
Sodium: 138 mmol/L (ref 135–145)
TOTAL PROTEIN: 6.4 g/dL — AB (ref 6.5–8.1)

## 2015-11-18 LAB — URINALYSIS, ROUTINE W REFLEX MICROSCOPIC
BILIRUBIN URINE: NEGATIVE
Glucose, UA: NEGATIVE mg/dL
Hgb urine dipstick: NEGATIVE
Ketones, ur: NEGATIVE mg/dL
LEUKOCYTES UA: NEGATIVE
NITRITE: NEGATIVE
PH: 6.5 (ref 5.0–8.0)
Protein, ur: NEGATIVE mg/dL
SPECIFIC GRAVITY, URINE: 1.012 (ref 1.005–1.030)

## 2015-11-18 LAB — CBC
HEMATOCRIT: 34.1 % — AB (ref 39.0–52.0)
HEMOGLOBIN: 10.9 g/dL — AB (ref 13.0–17.0)
MCH: 25 pg — ABNORMAL LOW (ref 26.0–34.0)
MCHC: 32 g/dL (ref 30.0–36.0)
MCV: 78.2 fL (ref 78.0–100.0)
Platelets: 70 10*3/uL — ABNORMAL LOW (ref 150–400)
RBC: 4.36 MIL/uL (ref 4.22–5.81)
RDW: 20.2 % — ABNORMAL HIGH (ref 11.5–15.5)
WBC: 5.1 10*3/uL (ref 4.0–10.5)

## 2015-11-18 LAB — LIPASE, BLOOD: Lipase: 76 U/L — ABNORMAL HIGH (ref 11–51)

## 2015-11-18 LAB — I-STAT CG4 LACTIC ACID, ED: LACTIC ACID, VENOUS: 1.59 mmol/L (ref 0.5–1.9)

## 2015-11-18 MED ORDER — KETOROLAC TROMETHAMINE 60 MG/2ML IM SOLN: 60.0000 mg | Freq: Once | INTRAMUSCULAR | Status: DC

## 2015-11-18 MED ORDER — KETOROLAC TROMETHAMINE 15 MG/ML IJ SOLN
15.0000 mg | Freq: Once | INTRAMUSCULAR | Status: AC
  Administered 2015-11-18: 15 mg via INTRAVENOUS
  Filled 2015-11-18: qty 1

## 2015-11-18 NOTE — ED Triage Notes (Signed)
Pt reports umbilical hernia that has gotten bigger recently. Pt denies n/v/d. He also reports bilateral LE swelling. Denies hx of CHF. Denies shortness of breath.

## 2015-11-18 NOTE — ED Provider Notes (Signed)
[facility] DEPT Provider Note   CSN: [id number] Arrival date & time: 11/18/15  1747  History   Chief Complaint Chief Complaint  Patient presents with  . Hernia  . Leg Swelling    HPI [name] is a 65 y.o. male.   Abdominal Pain   This is a chronic problem. The current episode started more than 2 days ago. The problem occurs constantly. The problem has not changed since onset.Associated with: lifting. The pain is located in the periumbilical region. The pain is at a severity of 5/10. The pain is moderate. Associated symptoms include flatus. Pertinent negatives include anorexia, fever, belching, diarrhea, melena, nausea, vomiting, constipation, frequency, hematuria and headaches. The symptoms are aggravated by certain positions and activity.   Past Medical History:  Diagnosis Date  . Diabetes mellitus without complication ([place])   . Hernia   . Hypertension    There are no active problems to display for this patient.  Past Surgical History:  Procedure Laterality Date  . PELVIC FRACTURE SURGERY      Home Medications    Prior to Admission medications   Medication Sig Start Date End Date Taking? Authorizing Provider  Diclofenac Sodium CR (VOLTAREN-XR) 100 MG 24 hr tablet Take 1 tablet (100 mg total) by mouth daily. 10/09/15   April Palumbo, MD  lidocaine (LIDODERM) 5 % Place 1 patch onto the skin daily. Remove & Discard patch within 12 hours or as directed by MD 10/09/15   April Palumbo, MD  methocarbamol (ROBAXIN) 500 MG tablet Take 1 tablet (500 mg total) by mouth 2 (two) times daily. 10/09/15   Veatrice Kells, MD   Family History History reviewed. No pertinent family history.  Social History Social History  Substance Use Topics  . Smoking status: Former Research scientist (life sciences)  . Smokeless tobacco: Never Used  . Alcohol use Yes     Comment: ocassionally   Allergies   Review of patient's allergies indicates no known allergies.   Review of Systems Review of Systems    Constitutional: Negative for fever.  Gastrointestinal: Positive for abdominal pain and flatus. Negative for anorexia, constipation, diarrhea, melena, nausea and vomiting.  Genitourinary: Negative for frequency and hematuria.  Neurological: Negative for headaches.  All other systems reviewed and are negative.  Physical Exam Updated Vital Signs BP 152/96   Pulse 70   Temp 97.8 F (36.6 C) (Oral)   Resp (!) 28   Ht 5\' 11"  (1.803 m)   Wt 95.7 kg   SpO2 98%   BMI 29.43 kg/m   Physical Exam  Constitutional: He is oriented to person, place, and time. He appears well-developed and well-nourished. No distress.  HENT:  Head: Normocephalic and atraumatic.  Eyes: Pupils are equal, round, and reactive to light.  Neck: Normal range of motion.  Cardiovascular: Normal rate and regular rhythm.   Pulmonary/Chest: Effort normal and breath sounds normal. No respiratory distress. He has no wheezes.  Abdominal: Soft. He exhibits no distension and no mass. There is no rebound and no guarding. A hernia is present.  Negative Murphy's  Mild tenderness to hernia site that is reducible  Negative Rosvings  Musculoskeletal: Normal range of motion. He exhibits no edema.  Neurological: He is alert and oriented to person, place, and time.  Skin: Skin is warm and dry. Capillary refill takes less than 2 seconds. He is not diaphoretic.  Psychiatric: He has a normal mood and affect. His behavior is normal. Thought content normal.  Nursing note and vitals reviewed.  ED Treatments /  Results  Labs (all labs ordered are listed, but only abnormal results are displayed) Labs Reviewed  LIPASE, BLOOD - Abnormal; Notable for the following:       Result Value   Lipase 76 (*)    All other components within normal limits  COMPREHENSIVE METABOLIC PANEL - Abnormal; Notable for the following:    Glucose, Bld 102 (*)    Calcium 8.5 (*)    Total Protein 6.4 (*)    Albumin 2.6 (*)    AST 64 (*)    Total Bilirubin  1.3 (*)    Anion gap 4 (*)    All other components within normal limits  CBC - Abnormal; Notable for the following:    Hemoglobin 10.9 (*)    HCT 34.1 (*)    MCH 25.0 (*)    RDW 20.2 (*)    Platelets 70 (*)    All other components within normal limits  URINALYSIS, ROUTINE W REFLEX MICROSCOPIC (NOT AT [facility])  I-STAT CG4 LACTIC ACID, ED  I-STAT CG4 LACTIC ACID, ED   Procedures Procedures (including critical care time)  EMERGENCY DEPARTMENT [place] ABD/AORTA EXAM Study: Limited Ultrasound of the Abdominal Aorta.  INDICATIONS:Abdominal pain Indication: Multiple views of the abdominal aorta are obtained from the diaphragmatic hiatus to the aortic bifurcation in transverse and sagittal planes with a multi- Frequency probe.  PERFORMED BY: Myself  IMAGES ARCHIVED?: Yes  FINDINGS: Maximum aortic dimensions are 3.0 cm  LIMITATIONS:  Abdominal pain  INTERPRETATION:  No abdominal aortic aneurysm   Medications Ordered in ED Medications - No data to display   Initial Impression / Assessment and Plan / ED Course  I have reviewed the triage vital signs and the nursing notes.  Pertinent labs & imaging results that were available during my care of the patient were reviewed by me and considered in my medical decision making (see chart for details).  Clinical Course   Patient presents to the ED with pain from his umbilical hernia that has been present for years but worsened after lifting a heavy object.   Easily reducible. Soft abdomen.   No evidence of cholecystitis.   No other symptoms including no vomiting, no obstipation, passing flatus. Well appearing.   Labs reassuring. Vitals WNL.   Abdominal US shows no AAA.   Given reducibility and well appearance, patient will follow up with [facility] for further management.  Final Clinical Impressions(s) / ED Diagnoses   Final diagnoses:  Umbilical hernia without obstruction and without gangrene   New Prescriptions Discharge  Medication List as of 11/18/2015  9:21 PM       Roberto Scales, MD 11/19/15 [place], MD 11/19/15 2014

## 2015-11-18 NOTE — ED Notes (Signed)
Pt c/o umbilical hernia pain. Was dx with hernia in 2002, has not seen a surgeon nor does pt have a pcp. Pt c/o severe pain. 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

## 2015-12-24 ENCOUNTER — Encounter (HOSPITAL_COMMUNITY): Payer: Self-pay

## 2015-12-24 ENCOUNTER — Emergency Department (HOSPITAL_COMMUNITY)
Admission: EM | Admit: 2015-12-24 | Discharge: 2015-12-24 | Disposition: A | Discharge status: Home / Self Care | Payer: Self-pay | Attending: Emergency Medicine | Admitting: Emergency Medicine

## 2015-12-24 DIAGNOSIS — E119 Type 2 diabetes mellitus without complications: Secondary | ICD-10-CM | POA: Insufficient documentation

## 2015-12-24 DIAGNOSIS — M5441 Lumbago with sciatica, right side: Secondary | ICD-10-CM | POA: Insufficient documentation

## 2015-12-24 DIAGNOSIS — Z79899 Other long term (current) drug therapy: Secondary | ICD-10-CM | POA: Insufficient documentation

## 2015-12-24 DIAGNOSIS — M5442 Lumbago with sciatica, left side: Secondary | ICD-10-CM | POA: Insufficient documentation

## 2015-12-24 DIAGNOSIS — Z87891 Personal history of nicotine dependence: Secondary | ICD-10-CM | POA: Insufficient documentation

## 2015-12-24 DIAGNOSIS — I10 Essential (primary) hypertension: Secondary | ICD-10-CM | POA: Insufficient documentation

## 2015-12-24 LAB — CBG MONITORING, ED: Glucose-Capillary: 127 mg/dL — ABNORMAL HIGH (ref 65–99)

## 2015-12-24 MED ORDER — METHOCARBAMOL 500 MG PO TABS: 500.0000 mg | ORAL_TABLET | Freq: Three times a day (TID) | ORAL | 0 refills | Status: DC | PRN

## 2015-12-24 MED ORDER — IBUPROFEN 400 MG PO TABS: 400.0000 mg | ORAL_TABLET | Freq: Four times a day (QID) | ORAL | 0 refills | Status: DC | PRN

## 2015-12-24 NOTE — ED Notes (Signed)
Dr. Alvino Chapel informed of CBG. Pt states he had a bowl of grits and a Dr. Malachi Bonds en route to the hospital.

## 2015-12-24 NOTE — ED Notes (Signed)
Pt is in stable condition upon d/c and ambulates from ED. 
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
## 2015-12-24 NOTE — Discharge Instructions (Signed)
Follow up with primary care doctor.
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
## 2016-01-07 ENCOUNTER — Encounter: Payer: Self-pay | Admitting: General Surgery

## 2016-02-15 ENCOUNTER — Inpatient Hospital Stay (HOSPITAL_COMMUNITY)
Admission: EM | Admit: 2016-02-15 | Discharge: 2016-02-18 | DRG: 378 | Disposition: A | Discharge status: Home / Self Care | Payer: Self-pay | Attending: Internal Medicine | Admitting: Internal Medicine

## 2016-02-15 ENCOUNTER — Emergency Department (HOSPITAL_COMMUNITY): Payer: Self-pay

## 2016-02-15 ENCOUNTER — Encounter (HOSPITAL_COMMUNITY): Payer: Self-pay

## 2016-02-15 DIAGNOSIS — K766 Portal hypertension: Secondary | ICD-10-CM | POA: Diagnosis present

## 2016-02-15 DIAGNOSIS — D649 Anemia, unspecified: Secondary | ICD-10-CM | POA: Diagnosis present

## 2016-02-15 DIAGNOSIS — K625 Hemorrhage of anus and rectum: Secondary | ICD-10-CM | POA: Diagnosis present

## 2016-02-15 DIAGNOSIS — K649 Unspecified hemorrhoids: Secondary | ICD-10-CM | POA: Diagnosis present

## 2016-02-15 DIAGNOSIS — R1030 Lower abdominal pain, unspecified: Secondary | ICD-10-CM

## 2016-02-15 DIAGNOSIS — I10 Essential (primary) hypertension: Secondary | ICD-10-CM | POA: Diagnosis present

## 2016-02-15 DIAGNOSIS — K3189 Other diseases of stomach and duodenum: Secondary | ICD-10-CM | POA: Diagnosis present

## 2016-02-15 DIAGNOSIS — D123 Benign neoplasm of transverse colon: Secondary | ICD-10-CM | POA: Diagnosis present

## 2016-02-15 DIAGNOSIS — B1920 Unspecified viral hepatitis C without hepatic coma: Secondary | ICD-10-CM | POA: Diagnosis present

## 2016-02-15 DIAGNOSIS — K7460 Unspecified cirrhosis of liver: Secondary | ICD-10-CM | POA: Diagnosis present

## 2016-02-15 DIAGNOSIS — R188 Other ascites: Secondary | ICD-10-CM

## 2016-02-15 DIAGNOSIS — D696 Thrombocytopenia, unspecified: Secondary | ICD-10-CM | POA: Diagnosis present

## 2016-02-15 DIAGNOSIS — D122 Benign neoplasm of ascending colon: Secondary | ICD-10-CM | POA: Diagnosis present

## 2016-02-15 DIAGNOSIS — K921 Melena: Principal | ICD-10-CM | POA: Diagnosis present

## 2016-02-15 DIAGNOSIS — D124 Benign neoplasm of descending colon: Secondary | ICD-10-CM | POA: Diagnosis present

## 2016-02-15 DIAGNOSIS — D127 Benign neoplasm of rectosigmoid junction: Secondary | ICD-10-CM | POA: Diagnosis present

## 2016-02-15 DIAGNOSIS — E8809 Other disorders of plasma-protein metabolism, not elsewhere classified: Secondary | ICD-10-CM | POA: Diagnosis present

## 2016-02-15 DIAGNOSIS — F1020 Alcohol dependence, uncomplicated: Secondary | ICD-10-CM | POA: Diagnosis present

## 2016-02-15 DIAGNOSIS — K7031 Alcoholic cirrhosis of liver with ascites: Secondary | ICD-10-CM | POA: Diagnosis present

## 2016-02-15 DIAGNOSIS — Z23 Encounter for immunization: Secondary | ICD-10-CM

## 2016-02-15 DIAGNOSIS — Z87891 Personal history of nicotine dependence: Secondary | ICD-10-CM

## 2016-02-15 DIAGNOSIS — D689 Coagulation defect, unspecified: Secondary | ICD-10-CM | POA: Diagnosis present

## 2016-02-15 DIAGNOSIS — K439 Ventral hernia without obstruction or gangrene: Secondary | ICD-10-CM | POA: Diagnosis present

## 2016-02-15 DIAGNOSIS — F1010 Alcohol abuse, uncomplicated: Secondary | ICD-10-CM | POA: Diagnosis present

## 2016-02-15 DIAGNOSIS — E119 Type 2 diabetes mellitus without complications: Secondary | ICD-10-CM | POA: Diagnosis present

## 2016-02-15 DIAGNOSIS — K922 Gastrointestinal hemorrhage, unspecified: Secondary | ICD-10-CM | POA: Diagnosis present

## 2016-02-15 DIAGNOSIS — R109 Unspecified abdominal pain: Secondary | ICD-10-CM

## 2016-02-15 DIAGNOSIS — D62 Acute posthemorrhagic anemia: Secondary | ICD-10-CM | POA: Diagnosis present

## 2016-02-15 DIAGNOSIS — R768 Other specified abnormal immunological findings in serum: Secondary | ICD-10-CM | POA: Diagnosis present

## 2016-02-15 DIAGNOSIS — I864 Gastric varices: Secondary | ICD-10-CM | POA: Diagnosis present

## 2016-02-15 HISTORY — DX: Unspecified abdominal hernia without obstruction or gangrene: K46.9

## 2016-02-15 HISTORY — DX: Hemorrhage of anus and rectum: K62.5

## 2016-02-15 HISTORY — DX: Unspecified osteoarthritis, unspecified site: M19.90

## 2016-02-15 HISTORY — DX: Type 2 diabetes mellitus without complications: E11.9

## 2016-02-15 LAB — URINALYSIS, ROUTINE W REFLEX MICROSCOPIC
Bilirubin Urine: NEGATIVE
Glucose, UA: NEGATIVE mg/dL
Hgb urine dipstick: NEGATIVE
Ketones, ur: NEGATIVE mg/dL
LEUKOCYTES UA: NEGATIVE
NITRITE: NEGATIVE
PH: 5 (ref 5.0–8.0)
Protein, ur: NEGATIVE mg/dL
SPECIFIC GRAVITY, URINE: 1.009 (ref 1.005–1.030)

## 2016-02-15 LAB — COMPREHENSIVE METABOLIC PANEL
ALT: 36 U/L (ref 17–63)
ANION GAP: 8 (ref 5–15)
AST: 76 U/L — ABNORMAL HIGH (ref 15–41)
Albumin: 2.2 g/dL — ABNORMAL LOW (ref 3.5–5.0)
Alkaline Phosphatase: 92 U/L (ref 38–126)
BILIRUBIN TOTAL: 2.3 mg/dL — AB (ref 0.3–1.2)
BUN: <5 mg/dL — ABNORMAL LOW (ref 6–20)
CHLORIDE: 106 mmol/L (ref 101–111)
CO2: 22 mmol/L (ref 22–32)
Calcium: 8.2 mg/dL — ABNORMAL LOW (ref 8.9–10.3)
Creatinine, Ser: 0.82 mg/dL (ref 0.61–1.24)
GFR calc Af Amer: >60 mL/min (ref >60)
Glucose, Bld: 138 mg/dL — ABNORMAL HIGH (ref 65–99)
POTASSIUM: 3.9 mmol/L (ref 3.5–5.1)
Sodium: 136 mmol/L (ref 135–145)
TOTAL PROTEIN: 7.2 g/dL (ref 6.5–8.1)

## 2016-02-15 LAB — CBC
HEMATOCRIT: 32.2 % — AB (ref 39.0–52.0)
HEMOGLOBIN: 11 g/dL — AB (ref 13.0–17.0)
MCH: 27 pg (ref 26.0–34.0)
MCHC: 34.2 g/dL (ref 30.0–36.0)
MCV: 79.1 fL (ref 78.0–100.0)
Platelets: 58 10*3/uL — ABNORMAL LOW (ref 150–400)
RBC: 4.07 MIL/uL — AB (ref 4.22–5.81)
RDW: 23.3 % — ABNORMAL HIGH (ref 11.5–15.5)
WBC: 4.8 10*3/uL (ref 4.0–10.5)

## 2016-02-15 LAB — LIPASE, BLOOD: LIPASE: 57 U/L — AB (ref 11–51)

## 2016-02-15 MED ORDER — FENTANYL CITRATE (PF) 100 MCG/2ML IJ SOLN
50.0000 ug | Freq: Once | INTRAMUSCULAR | Status: AC
  Administered 2016-02-15: 50 ug via INTRAVENOUS
  Filled 2016-02-15: qty 2

## 2016-02-15 MED ORDER — IOPAMIDOL (ISOVUE-300) INJECTION 61%
INTRAVENOUS | Status: AC
Start: 2016-02-15 — End: 2016-02-15
  Administered 2016-02-15: 100 mL via INTRAVENOUS
  Filled 2016-02-15: qty 100

## 2016-02-15 NOTE — ED Triage Notes (Signed)
Onset 2 weeks abd pain and diarrhea.  Blood noted in stool.

## 2016-02-16 ENCOUNTER — Encounter (HOSPITAL_COMMUNITY): Payer: Self-pay | Admitting: Internal Medicine

## 2016-02-16 DIAGNOSIS — F1010 Alcohol abuse, uncomplicated: Secondary | ICD-10-CM | POA: Diagnosis present

## 2016-02-16 DIAGNOSIS — D649 Anemia, unspecified: Secondary | ICD-10-CM | POA: Diagnosis present

## 2016-02-16 DIAGNOSIS — K922 Gastrointestinal hemorrhage, unspecified: Secondary | ICD-10-CM | POA: Diagnosis present

## 2016-02-16 DIAGNOSIS — K7460 Unspecified cirrhosis of liver: Secondary | ICD-10-CM | POA: Diagnosis present

## 2016-02-16 DIAGNOSIS — D696 Thrombocytopenia, unspecified: Secondary | ICD-10-CM | POA: Diagnosis present

## 2016-02-16 DIAGNOSIS — K625 Hemorrhage of anus and rectum: Secondary | ICD-10-CM | POA: Diagnosis present

## 2016-02-16 LAB — COMPREHENSIVE METABOLIC PANEL
ALT: 33 U/L (ref 17–63)
ANION GAP: 6 (ref 5–15)
AST: 76 U/L — ABNORMAL HIGH (ref 15–41)
Albumin: 2 g/dL — ABNORMAL LOW (ref 3.5–5.0)
Alkaline Phosphatase: 75 U/L (ref 38–126)
BUN: <5 mg/dL — ABNORMAL LOW (ref 6–20)
CHLORIDE: 108 mmol/L (ref 101–111)
CO2: 22 mmol/L (ref 22–32)
Calcium: 7.8 mg/dL — ABNORMAL LOW (ref 8.9–10.3)
Creatinine, Ser: 0.81 mg/dL (ref 0.61–1.24)
Glucose, Bld: 96 mg/dL (ref 65–99)
Potassium: 4 mmol/L (ref 3.5–5.1)
Sodium: 136 mmol/L (ref 135–145)
Total Bilirubin: 2.7 mg/dL — ABNORMAL HIGH (ref 0.3–1.2)
Total Protein: 6.7 g/dL (ref 6.5–8.1)

## 2016-02-16 LAB — CBC
HCT: 31.5 % — ABNORMAL LOW (ref 39.0–52.0)
HCT: 32.2 % — ABNORMAL LOW (ref 39.0–52.0)
HEMATOCRIT: 31.5 % — AB (ref 39.0–52.0)
HEMATOCRIT: 33.6 % — AB (ref 39.0–52.0)
HEMOGLOBIN: 11.2 g/dL — AB (ref 13.0–17.0)
Hemoglobin: 10.7 g/dL — ABNORMAL LOW (ref 13.0–17.0)
Hemoglobin: 10.4 g/dL — ABNORMAL LOW (ref 13.0–17.0)
Hemoglobin: 10.8 g/dL — ABNORMAL LOW (ref 13.0–17.0)
MCH: 26.8 pg (ref 26.0–34.0)
MCH: 26 pg (ref 26.0–34.0)
MCH: 26.5 pg (ref 26.0–34.0)
MCH: 26.3 pg (ref 26.0–34.0)
MCHC: 34 g/dL (ref 30.0–36.0)
MCHC: 33 g/dL (ref 30.0–36.0)
MCHC: 33.3 g/dL (ref 30.0–36.0)
MCHC: 33.5 g/dL (ref 30.0–36.0)
MCV: 78.8 fL (ref 78.0–100.0)
MCV: 78.8 fL (ref 78.0–100.0)
MCV: 79.4 fL (ref 78.0–100.0)
MCV: 78.5 fL (ref 78.0–100.0)
PLATELETS: 53 10*3/uL — AB (ref 150–400)
PLATELETS: 50 10*3/uL — AB (ref 150–400)
Platelets: 52 10*3/uL — ABNORMAL LOW (ref 150–400)
Platelets: 51 10*3/uL — ABNORMAL LOW (ref 150–400)
RBC: 4 MIL/uL — AB (ref 4.22–5.81)
RBC: 4 MIL/uL — AB (ref 4.22–5.81)
RBC: 4.23 MIL/uL (ref 4.22–5.81)
RBC: 4.1 MIL/uL — ABNORMAL LOW (ref 4.22–5.81)
RDW: 23.4 % — AB (ref 11.5–15.5)
RDW: 23.2 % — AB (ref 11.5–15.5)
RDW: 23.1 % — ABNORMAL HIGH (ref 11.5–15.5)
RDW: 23.1 % — AB (ref 11.5–15.5)
WBC: 4.4 10*3/uL (ref 4.0–10.5)
WBC: 5 10*3/uL (ref 4.0–10.5)
WBC: 4.4 10*3/uL (ref 4.0–10.5)
WBC: 4.1 10*3/uL (ref 4.0–10.5)

## 2016-02-16 LAB — IRON AND TIBC
IRON: 66 ug/dL (ref 45–182)
Saturation Ratios: 24 % (ref 17.9–39.5)
TIBC: 276 ug/dL (ref 250–450)
UIBC: 210 ug/dL

## 2016-02-16 LAB — GLUCOSE, CAPILLARY: GLUCOSE-CAPILLARY: 136 mg/dL — AB (ref 65–99)

## 2016-02-16 LAB — CBG MONITORING, ED
GLUCOSE-CAPILLARY: 76 mg/dL (ref 65–99)
GLUCOSE-CAPILLARY: 95 mg/dL (ref 65–99)
GLUCOSE-CAPILLARY: 114 mg/dL — AB (ref 65–99)

## 2016-02-16 LAB — PROTIME-INR
INR: 2.16
Prothrombin Time: 24.5 seconds — ABNORMAL HIGH (ref 11.4–15.2)

## 2016-02-16 LAB — VITAMIN B12: Vitamin B-12: 2191 pg/mL — ABNORMAL HIGH (ref 180–914)

## 2016-02-16 LAB — RETICULOCYTES
RBC.: 4.13 MIL/uL — ABNORMAL LOW (ref 4.22–5.81)
RETIC COUNT ABSOLUTE: 82.6 10*3/uL (ref 19.0–186.0)
Retic Ct Pct: 2 % (ref 0.4–3.1)

## 2016-02-16 LAB — FERRITIN: FERRITIN: 25 ng/mL (ref 24–336)

## 2016-02-16 LAB — FOLATE: FOLATE: 12.2 ng/mL (ref >5.9)

## 2016-02-16 LAB — POC OCCULT BLOOD, ED: Fecal Occult Bld: POSITIVE — AB

## 2016-02-16 MED ORDER — INFLUENZA VAC SPLIT QUAD 0.5 ML IM SUSY: 0.5000 mL | PREFILLED_SYRINGE | INTRAMUSCULAR | Status: DC

## 2016-02-16 MED ORDER — VITAMIN K1 10 MG/ML IJ SOLN
5.0000 mg | Freq: Once | INTRAVENOUS | Status: AC
  Administered 2016-02-16: 5 mg via INTRAVENOUS
  Filled 2016-02-16: qty 0.5

## 2016-02-16 MED ORDER — ONDANSETRON HCL 4 MG/2ML IJ SOLN: 4.0000 mg | Freq: Four times a day (QID) | INTRAMUSCULAR | Status: DC | PRN

## 2016-02-16 MED ORDER — PEG 3350-KCL-NA BICARB-NACL 420 G PO SOLR
4000.0000 mL | Freq: Once | ORAL | Status: AC
  Administered 2016-02-16: 4000 mL via ORAL
  Filled 2016-02-16: qty 4000

## 2016-02-16 MED ORDER — SODIUM CHLORIDE 0.9 % IV SOLN
8.0000 mg/h | INTRAVENOUS | Status: DC
  Administered 2016-02-16 – 2016-02-17 (×5): 8 mg/h via INTRAVENOUS
  Filled 2016-02-16 (×9): qty 80

## 2016-02-16 MED ORDER — LORAZEPAM 2 MG/ML IJ SOLN: 1.0000 mg | Freq: Four times a day (QID) | INTRAMUSCULAR | Status: DC | PRN

## 2016-02-16 MED ORDER — SODIUM CHLORIDE 0.9 % IV SOLN
80.0000 mg | Freq: Once | INTRAVENOUS | Status: AC
  Administered 2016-02-16: 07:00:00 80 mg via INTRAVENOUS
  Filled 2016-02-16: qty 80

## 2016-02-16 MED ORDER — ONDANSETRON HCL 4 MG PO TABS: 4.0000 mg | ORAL_TABLET | Freq: Four times a day (QID) | ORAL | Status: DC | PRN

## 2016-02-16 MED ORDER — DEXTROSE 5 % IV SOLN
2.0000 g | Freq: Every day | INTRAVENOUS | Status: DC
  Administered 2016-02-16 – 2016-02-18 (×3): 2 g via INTRAVENOUS
  Filled 2016-02-16 (×3): qty 2

## 2016-02-16 MED ORDER — PHYTONADIONE 5 MG PO TABS
10.0000 mg | ORAL_TABLET | Freq: Every day | ORAL | Status: DC
  Administered 2016-02-18: 10 mg via ORAL
  Filled 2016-02-16 (×2): qty 2

## 2016-02-16 MED ORDER — ACETAMINOPHEN 650 MG RE SUPP: 650.0000 mg | Freq: Four times a day (QID) | RECTAL | Status: DC | PRN

## 2016-02-16 MED ORDER — MORPHINE SULFATE (PF) 4 MG/ML IV SOLN
1.0000 mg | Freq: Once | INTRAVENOUS | Status: AC
  Administered 2016-02-16: 1 mg via INTRAVENOUS
  Filled 2016-02-16: qty 1

## 2016-02-16 MED ORDER — LORAZEPAM 2 MG/ML IJ SOLN
0.0000 mg | Freq: Four times a day (QID) | INTRAMUSCULAR | Status: AC
  Administered 2016-02-16 – 2016-02-17 (×2): 1 mg via INTRAVENOUS
  Administered 2016-02-17: 2 mg via INTRAVENOUS
  Filled 2016-02-16 (×3): qty 1

## 2016-02-16 MED ORDER — FAMOTIDINE IN NACL 20-0.9 MG/50ML-% IV SOLN: 20.0000 mg | Freq: Once | INTRAVENOUS | Status: DC

## 2016-02-16 MED ORDER — SODIUM CHLORIDE 0.9 % IV SOLN
50.0000 ug/h | INTRAVENOUS | Status: DC
  Administered 2016-02-16 (×2): 50 ug/h via INTRAVENOUS
  Filled 2016-02-16 (×5): qty 1

## 2016-02-16 MED ORDER — HYDROMORPHONE HCL 2 MG/ML IJ SOLN
0.5000 mg | INTRAMUSCULAR | Status: DC | PRN
  Administered 2016-02-16 – 2016-02-18 (×8): 0.5 mg via INTRAVENOUS
  Filled 2016-02-16 (×8): qty 1

## 2016-02-16 MED ORDER — THIAMINE HCL 100 MG/ML IJ SOLN
100.0000 mg | Freq: Every day | INTRAMUSCULAR | Status: DC
  Filled 2016-02-16: qty 2

## 2016-02-16 MED ORDER — ACETAMINOPHEN 325 MG PO TABS
650.0000 mg | ORAL_TABLET | Freq: Four times a day (QID) | ORAL | Status: DC | PRN
  Administered 2016-02-16 (×2): 650 mg via ORAL
  Filled 2016-02-16 (×4): qty 2

## 2016-02-16 MED ORDER — LORAZEPAM 2 MG/ML IJ SOLN: 0.0000 mg | Freq: Two times a day (BID) | INTRAMUSCULAR | Status: DC

## 2016-02-16 MED ORDER — FOLIC ACID 1 MG PO TABS
1.0000 mg | ORAL_TABLET | Freq: Every day | ORAL | Status: DC
  Administered 2016-02-16 – 2016-02-18 (×2): 1 mg via ORAL
  Filled 2016-02-16 (×3): qty 1

## 2016-02-16 MED ORDER — PANTOPRAZOLE SODIUM 40 MG IV SOLR: 40.0000 mg | Freq: Two times a day (BID) | INTRAVENOUS | Status: DC

## 2016-02-16 MED ORDER — LORAZEPAM 1 MG PO TABS: 1.0000 mg | ORAL_TABLET | Freq: Four times a day (QID) | ORAL | Status: DC | PRN

## 2016-02-16 MED ORDER — ADULT MULTIVITAMIN W/MINERALS CH
1.0000 | ORAL_TABLET | Freq: Every day | ORAL | Status: DC
  Administered 2016-02-16 – 2016-02-18 (×2): 1 via ORAL
  Filled 2016-02-16 (×3): qty 1

## 2016-02-16 MED ORDER — PNEUMOCOCCAL VAC POLYVALENT 25 MCG/0.5ML IJ INJ
0.5000 mL | INJECTION | INTRAMUSCULAR | Status: AC
  Administered 2016-02-17: 0.5 mL via INTRAMUSCULAR
  Filled 2016-02-16: qty 0.5

## 2016-02-16 MED ORDER — VITAMIN B-1 100 MG PO TABS
100.0000 mg | ORAL_TABLET | Freq: Every day | ORAL | Status: DC
  Administered 2016-02-16 – 2016-02-18 (×2): 100 mg via ORAL
  Filled 2016-02-16 (×3): qty 1

## 2016-02-16 NOTE — Progress Notes (Signed)
[facility] PROGRESS NOTE  [name] [id number] DOB: 04/07/1950 DOA: 02/15/2016  PCP: No PCP Per Patient  Brief History/Interval Summary: [name] is a 67 y.o. male with history of alcoholism who has not been to a physician for many years presented to the ER because of abdominal pain and rectal bleeding. Patient has been having abdominal discomfort for last few months with off-and-on rectal bleeding. CT of the abdomen and pelvis shows features concerning for cirrhosis with moderate ascites and ventral hernia and perigastric varices.    Reason for Visit: Rectal bleeding  Consultants: Gastroenterology: Dr. Benson Norway  Procedures: None So far  Antibiotics: Ceftriaxone  Subjective/Interval History: Patient hasn't had any further episodes of bleeding since last night. Denies any abdominal pain, nausea or vomiting currently.  ROS: Denies any chest pain or shortness of breath.  Objective:  Vital Signs  Vitals:   02/16/16 0507 02/16/16 0629 02/16/16 0752 02/16/16 1038  BP: 150/93 139/84 135/89 112/95  Pulse:  83 82 88  Resp:  17 16 22   Temp:      TempSrc:      SpO2:  97% 97% 96%    Intake/Output Summary (Last 24 hours) at 02/16/16 1154 Last data filed at 02/16/16 1032  Gross per 24 hour  Intake              100 ml  Output                0 ml  Net              100 ml   There were no vitals filed for this visit.  General appearance: alert, cooperative, appears stated age and no distress Resp: clear to auscultation bilaterally Cardio: regular rate and rhythm, S1, S2 normal, no murmur, click, rub or gallop GI: Mildly distended. No hepatomegaly. No other masses. Soft, nontender. Extremities: extremities normal, atraumatic, no cyanosis or edema Neurologic: No focal deficits.  Lab Results:  Data Reviewed: I have personally reviewed following labs and imaging studies  CBC:  Recent Labs Lab 02/15/16 1607 02/16/16 0128 02/16/16 0416 02/16/16 0657  02/16/16 0912  WBC 4.8 4.4 5.0 4.4 4.1  HGB 11.0* 10.7* 10.4* 11.2* 10.8*  HCT 32.2* 31.5* 31.5* 33.6* 32.2*  MCV 79.1 78.8 78.8 79.4 78.5  PLT 58* 53* 52* 51* 50*    Basic Metabolic Panel:  Recent Labs Lab 02/15/16 1607 02/16/16 0657  NA 136 136  K 3.9 4.0  CL 106 108  CO2 22 22  GLUCOSE 138* 96  BUN <5* <5*  CREATININE 0.82 0.81  CALCIUM 8.2* 7.8*    GFR: CrCl cannot be calculated (Unknown ideal weight.).  Liver Function Tests:  Recent Labs Lab 02/15/16 1607 02/16/16 0657  AST 76* 76*  ALT 36 33  ALKPHOS 92 75  BILITOT 2.3* 2.7*  PROT 7.2 6.7  ALBUMIN 2.2* 2.0*     Recent Labs Lab 02/15/16 1607  LIPASE 57*   Coagulation Profile:  Recent Labs Lab 02/16/16 0138  INR 2.16    CBG:  Recent Labs Lab 02/16/16 0416 02/16/16 0801  GLUCAP 76 95    Anemia Panel:  Recent Labs  02/16/16 0912  VITAMINB12 2,191*  FOLATE 12.2  FERRITIN 25  TIBC 276  IRON 66  RETICCTPCT 2.0    Radiology Studies: Ct Abdomen Pelvis W Contrast  Result Date: 02/16/2016 CLINICAL DATA:  Abdominal pain, known hernia. EXAM: CT ABDOMEN AND PELVIS WITH CONTRAST TECHNIQUE: Multidetector CT imaging of the abdomen and pelvis was  performed using the standard protocol following bolus administration of intravenous contrast. CONTRAST:  175mL ISOVUE-300 IOPAMIDOL (ISOVUE-300) INJECTION 61% COMPARISON:  CT from 12/07/2014 FINDINGS: Lower chest: No acute abnormality. Hepatobiliary: Macronodular appearance of the hepatic contour with patent umbilical vein and moderate volume of ascites in the abdomen and pelvis consistent with cirrhosis. No space-occupying mass of the liver. No biliary dilatation. The gallbladder is distended and there is trace adjacent pericholecystic fluid likely related to ascites. Pancreas: Unremarkable Spleen: No splenomegaly. Splenic calcification consistent with a granuloma. Small hilar splenule. Adrenals/Urinary Tract: Normal bilateral adrenal glands, kidneys and  bladder. Stomach/Bowel: Stomach is contracted. Small perigastric varices. There is normal small bowel rotation. Mild proximal small bowel distention with fluid and air. No mechanical obstruction. Findings may be related to a mild enteritis or potentially ileus. Large bowel is unremarkable. Vascular/Lymphatic: Aortoiliac atherosclerosis without aneurysm. Patent portal, splenic and superior mesenteric veins. Patent celiac axis, SMA and IMA. No lymphadenopathy. Reproductive: Normal size prostate. Other: No free air. Stable midline omental fat containing supraumbilical hernia. Musculoskeletal: L5 spondylolysis with grade 1 spondylolisthesis. Screws noted across the sacroiliac joints bilaterally. IMPRESSION: 1. Findings in keeping with cirrhosis with moderate volume ascites, perigastric varices, patent umbilical vein but without space-occupying mass of the liver. 2. Mild distention of proximal small bowel loops possibly representing a mild enteritis or localized ileus. 3. Splenic granuloma. 4. L5 spondylolysis with grade 1 spondylolisthesis. Sacroiliac joint fixation with screws bilaterally. 5. Small fat containing supraumbilical ventral hernia. Electronically Signed   By: Ashley Royalty M.D.   On: 02/16/2016 00:42     Medications:  Scheduled: . folic acid  1 mg Oral Daily  . LORazepam  0-4 mg Intravenous Q6H   Followed by  . [START ON 02/18/2016] LORazepam  0-4 mg Intravenous Q12H  . multivitamin with minerals  1 tablet Oral Daily  . [START ON 02/19/2016] pantoprazole  40 mg Intravenous Q12H  . [START ON 02/17/2016] phytonadione  10 mg Oral Daily  . thiamine  100 mg Oral Daily   Or  . thiamine  100 mg Intravenous Daily   Continuous: . cefTRIAXone (ROCEPHIN)  IV Stopped (02/16/16 0516)  . octreotide  (SANDOSTATIN)    IV infusion 50 mcg/hr (02/16/16 0308)  . pantoprozole (PROTONIX) infusion 8 mg/hr (02/16/16 0957)   [id number] **OR** acetaminophen, LORazepam **OR** LORazepam, ondansetron **OR**  ondansetron (ZOFRAN) IV  Assessment/Plan:  Principal Problem:   Rectal bleeding Active Problems:   Lower GI bleed   Thrombocytopenia ([facility])   Normochromic normocytic anemia   Alcohol abuse   Cirrhosis of liver ([facility])   GI bleed    Rectal bleeding This is been an ongoing issue for at least the last few weeks. Appears to be lower GI bleed. No appreciable drop in hemoglobin. CT scan does suggest perigastric varices along with ascites. Patient placed on octreotide and PPI infusion. Also on ceftriaxone. Discussed with Dr. Benson Norway this morning who will see the patient later today. Nothing by mouth.   Acute blood loss anemia  Likely secondary to rectal bleeding. Hemoglobin did not drop significantly. Monitor closely. Transfuse as needed. No need for one at this time. Anemia panel reviewed.  Ascites, lower extremity edema and coagulopathy probably from cirrhosis Most likely alcoholic liver disease. Continue ceftriaxone. Would appreciate GI input in this matter as well. Hepatitis panel is pending. May need paracentesis. Vitamin K for coagulopathy. Patient counseled to stop drinking alcohol.   Abdominal pain Likely from ascites. CT scan shows possible ileus but patient has no vomiting. Monitor  closely. Continue ceftriaxone. Abdomen is benign this morning.  Alcohol abuse Patient is placed on CIWA protocol.  Thrombocytopenia  Probably from cirrhosis and alcoholism. Continue to monitor counts.  Elevated blood pressure Blood pressures have improved this morning. Continue to monitor.   Fat containing supraumbilical ventral hernia. Abdomen is benign. Outpatient monitoring.  DVT Prophylaxis: SCDs    Code Status: Full code  Family Communication: Discussed with patient  Disposition Plan: Await GI input.    LOS: 0 days   [facility] Pager [phone] 02/16/2016, 11:54 AM  If 7PM-7AM, please contact night-coverage at [email or web address], password [facility]
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
## 2016-02-16 NOTE — Progress Notes (Signed)
Pharmacy Antibiotic Note  Ival Phegley is a 66 y.o. male admitted on 02/15/2016 with possible intra-abd infection.  Pharmacy has been consulted for Rocephin dosing.  Plan: Rocephin 2gm IV q24h Pharmacy will sign off - please reconsult if needed     Temp (24hrs), Avg:98.5 F (36.9 C), Min:98.5 F (36.9 C), Max:98.5 F (36.9 C)   Recent Labs Lab 02/15/16 1607 02/16/16 0128  WBC 4.8 4.4  CREATININE 0.82  --     CrCl cannot be calculated (Unknown ideal weight.).    No Known Allergies  Thank you for allowing pharmacy to be a part of this patient's care.  Sherlon Handing, PharmD, BCPS Clinical pharmacist, pager [phone] 02/16/2016 2:52 AM

## 2016-02-16 NOTE — ED Provider Notes (Addendum)
[facility] DEPT Provider Note   CSN: [id number] Arrival date & time: 02/15/16  1522     History   Chief Complaint Chief Complaint  Patient presents with  . Abdominal Pain    HPI [name] is a 66 y.o. male.  HPI  Pt comes in with cc of abd pain. Pt has hx of DM, ? Liver cirrhosis. Pt reports that he started having abd pain few days ago, and it is constant, and getting worse. Pt has some nausea. No diarrhea, but has a few loose BM. No fevers, chills. Pt has never received paracentesis.  Pt also reports that he has some blood in his stools. The blood in his stools off and on for him and it is bright red. Pt has had a upper and lower scope, but he doesn't know where it was done or the results.  ROS 10 Systems reviewed and are negative for acute change except as noted in the HPI.       Past Medical History:  Diagnosis Date  . Diabetes mellitus without complication ([place])   . Hernia   . Hypertension     Patient Active Problem List   Diagnosis Date Noted  . Lower GI bleed 02/16/2016    Past Surgical History:  Procedure Laterality Date  . PELVIC FRACTURE SURGERY         Home Medications    Prior to Admission medications   Medication Sig Start Date End Date Taking? Authorizing Provider  Diclofenac Sodium CR (VOLTAREN-XR) 100 MG 24 hr tablet Take 1 tablet (100 mg total) by mouth daily. Patient not taking: Reported on 11/18/2015 10/09/15   April Palumbo, MD  ibuprofen (ADVIL,MOTRIN) 400 MG tablet Take 1 tablet (400 mg total) by mouth every 6 (six) hours as needed. 12/24/15   Davonna Belling, MD  lidocaine (LIDODERM) 5 % Place 1 patch onto the skin daily. Remove & Discard patch within 12 hours or as directed by MD Patient not taking: Reported on 11/18/2015 10/09/15   April Palumbo, MD  methocarbamol (ROBAXIN) 500 MG tablet Take 1 tablet (500 mg total) by mouth every 8 (eight) hours as needed for muscle spasms. 12/24/15   Davonna Belling, MD    Family  History History reviewed. No pertinent family history.  Social History Social History  Substance Use Topics  . Smoking status: Former Research scientist (life sciences)  . Smokeless tobacco: Never Used  . Alcohol use Yes     Comment: ocassionally     Allergies   Patient has no known allergies.   Review of Systems Review of Systems   Physical Exam Updated Vital Signs BP 152/81 (BP Location: Left Arm)   Pulse 69   Temp 98.5 F (36.9 C) (Oral)   Resp 18   SpO2 97%   Physical Exam  Constitutional: He is oriented to person, place, and time. He appears well-developed.  HENT:  Head: Normocephalic and atraumatic.  Eyes: Conjunctivae and EOM are normal. Pupils are equal, round, and reactive to light.  Neck: Normal range of motion. Neck supple.  Cardiovascular: Normal rate and regular rhythm.   Pulmonary/Chest: Effort normal and breath sounds normal.  Abdominal: Soft. Bowel sounds are normal. He exhibits no distension. There is tenderness. There is no rebound and no guarding.  Lower quadrant tenderness bilaterally  Neurological: He is alert and oriented to person, place, and time.  Skin: Skin is warm.  Nursing note and vitals reviewed.    ED Treatments / Results  Labs (all labs ordered are listed, but  only abnormal results are displayed) Labs Reviewed  LIPASE, BLOOD - Abnormal; Notable for the following:       Result Value   Lipase 57 (*)    All other components within normal limits  COMPREHENSIVE METABOLIC PANEL - Abnormal; Notable for the following:    Glucose, Bld 138 (*)    BUN <5 (*)    Calcium 8.2 (*)    Albumin 2.2 (*)    AST 76 (*)    Total Bilirubin 2.3 (*)    All other components within normal limits  CBC - Abnormal; Notable for the following:    RBC 4.07 (*)    Hemoglobin 11.0 (*)    HCT 32.2 (*)    RDW 23.3 (*)    Platelets 58 (*)    All other components within normal limits  URINALYSIS, ROUTINE W REFLEX MICROSCOPIC  CBC  PROTIME-INR  POC OCCULT BLOOD, ED  POC OCCULT  BLOOD, ED    EKG  EKG Interpretation None       Radiology Ct Abdomen Pelvis W Contrast  Result Date: 02/16/2016 CLINICAL DATA:  Abdominal pain, known hernia. EXAM: CT ABDOMEN AND PELVIS WITH CONTRAST TECHNIQUE: Multidetector CT imaging of the abdomen and pelvis was performed using the standard protocol following bolus administration of intravenous contrast. CONTRAST:  167mL ISOVUE-300 IOPAMIDOL (ISOVUE-300) INJECTION 61% COMPARISON:  CT from 12/07/2014 FINDINGS: Lower chest: No acute abnormality. Hepatobiliary: Macronodular appearance of the hepatic contour with patent umbilical vein and moderate volume of ascites in the abdomen and pelvis consistent with cirrhosis. No space-occupying mass of the liver. No biliary dilatation. The gallbladder is distended and there is trace adjacent pericholecystic fluid likely related to ascites. Pancreas: Unremarkable Spleen: No splenomegaly. Splenic calcification consistent with a granuloma. Small hilar splenule. Adrenals/Urinary Tract: Normal bilateral adrenal glands, kidneys and bladder. Stomach/Bowel: Stomach is contracted. Small perigastric varices. There is normal small bowel rotation. Mild proximal small bowel distention with fluid and air. No mechanical obstruction. Findings may be related to a mild enteritis or potentially ileus. Large bowel is unremarkable. Vascular/Lymphatic: Aortoiliac atherosclerosis without aneurysm. Patent portal, splenic and superior mesenteric veins. Patent celiac axis, SMA and IMA. No lymphadenopathy. Reproductive: Normal size prostate. Other: No free air. Stable midline omental fat containing supraumbilical hernia. Musculoskeletal: L5 spondylolysis with grade 1 spondylolisthesis. Screws noted across the sacroiliac joints bilaterally. IMPRESSION: 1. Findings in keeping with cirrhosis with moderate volume ascites, perigastric varices, patent umbilical vein but without space-occupying mass of the liver. 2. Mild distention of proximal  small bowel loops possibly representing a mild enteritis or localized ileus. 3. Splenic granuloma. 4. L5 spondylolysis with grade 1 spondylolisthesis. Sacroiliac joint fixation with screws bilaterally. 5. Small fat containing supraumbilical ventral hernia. Electronically Signed   By: Ashley Royalty M.D.   On: 02/16/2016 00:42    Procedures Procedures (including critical care time)  Medications Ordered in ED Medications  famotidine (PEPCID) IVPB 20 mg premix (not administered)  octreotide (SANDOSTATIN) 500 mcg in sodium chloride 0.9 % 250 mL (2 mcg/mL) infusion (not administered)  fentaNYL (SUBLIMAZE) injection 50 mcg (50 mcg Intravenous Given 02/15/16 2344)  iopamidol (ISOVUE-300) 61 % injection (100 mLs Intravenous Contrast Given 02/15/16 2358)     Initial Impression / Assessment and Plan / ED Course  I have reviewed the triage vital signs and the nursing notes.  Pertinent labs & imaging results that were available during my care of the patient were reviewed by me and considered in my medical decision making (see chart for details).  Pt comes in with cc of abd pain. Abd pain in the lower quadrants, with mild guarding, but no peritoneal signs. Pt has cirrhosis - but we dont think there is SBP.Marland Kitchen CT from 2016 reviewed -and there was some colitis and also varices. We dont think he is having SBP. We will get CT abdomen and pelvis. R/o diverticulitis / colitis. Lipase is slightly elevated - nonspecific at this time as pain is not epigastric. Octreotide started. [place] + stool   Final Clinical Impressions(s) / ED Diagnoses   Final diagnoses:  Lower abdominal pain  Lower GI bleeding    New Prescriptions New Prescriptions   No medications on file     Varney Biles, MD 02/16/16 [place], MD 02/16/16 [place], MD 02/16/16 [id number]

## 2016-02-16 NOTE — H&P (Addendum)
History and Physical    [name] [id number] DOB: 1951-01-02 DOA: 02/15/2016  PCP: No PCP Per Patient  Patient coming from: Home.  Chief Complaint: Abdominal pain and rectal bleeding.  HPI: [name] is a 66 y.o. male with history of alcoholism who has not been to a physician for many years presents to the ER because of abdominal pain and rectal bleeding. Patient has been having abdominal discomfort for last few months with off-and-on rectal bleeding. Since pain worsened with increasing abdominal distention patient came to the ER. Patient had at least 1 frankly rectal bowel movement in the ER. Patient states he has been having these bloody bowel movements for the last couple of months. Patient states he had been to a general surgeon 3 months ago for ventral hernia but was told that he may have cirrhosis and did not proceed to surgery. Patient states he drinks alcohol everyday.  CT of the abdomen and pelvis shows features concerning for cirrhosis with moderate ascites and ventral hernia and perigastric varices. Hemoglobin is around 11. Stool for occult blood is positive.   ED Course: Patient was started on octreotide and Protonix infusion.  Review of Systems: As per HPI, rest all negative.   Past Medical History:  Diagnosis Date  . Diabetes mellitus without complication ([place])   . Hernia   . Hypertension     Past Surgical History:  Procedure Laterality Date  . PELVIC FRACTURE SURGERY       reports that he has quit smoking. He has never used smokeless tobacco. He reports that he drinks alcohol. He reports that he does not use drugs.  No Known Allergies  Family History  Problem Relation Age of Onset  . Hypertension Other     Prior to Admission medications   Medication Sig Start Date End Date Taking? Authorizing Provider  hydroxypropyl methylcellulose / hypromellose (ISOPTO TEARS / GONIOVISC) 2.5 % ophthalmic solution Place 1 drop into both eyes 4 (four) times daily as  needed for dry eyes.   Yes Historical Provider, MD  ibuprofen (ADVIL,MOTRIN) 400 MG tablet Take 1 tablet (400 mg total) by mouth every 6 (six) hours as needed. Patient taking differently: Take 400 mg by mouth every 6 (six) hours as needed for moderate pain.  12/24/15  Yes Davonna Belling, MD  methocarbamol (ROBAXIN) 500 MG tablet Take 1 tablet (500 mg total) by mouth every 8 (eight) hours as needed for muscle spasms. 12/24/15  Yes Davonna Belling, MD    Physical Exam: Vitals:   02/15/16 1601 02/15/16 2007  BP: 146/88 152/81  Pulse: 80 69  Resp: 18 18  Temp: 98.5 F (36.9 C)   TempSrc: Oral   SpO2: 98% 97%      Constitutional: Moderately built and nourished. Vitals:   02/15/16 1601 02/15/16 2007  BP: 146/88 152/81  Pulse: 80 69  Resp: 18 18  Temp: 98.5 F (36.9 C)   TempSrc: Oral   SpO2: 98% 97%   Eyes: Anicteric no pallor. ENMT: No discharge from the ears eyes nose and mouth. Neck: No mass felt. No JVD appreciated. Respiratory: No rhonchi or crepitations. Cardiovascular: S1-S2 heard. No murmurs appreciated. Abdomen: Distended abdomen nontender bowel sounds present. Musculoskeletal: Bilateral lower extremity edema. Skin: No rash. Skin appears warm. Neurologic: Alert awake oriented to time place and person. Moves all extremities. Psychiatric: Appears normal. Normal affect.   Labs on Admission: I have personally reviewed following labs and imaging studies  CBC:  Recent Labs Lab 02/15/16 1607 02/16/16 0128  WBC 4.8 4.4  HGB 11.0* 10.7*  HCT 32.2* 31.5*  MCV 79.1 78.8  PLT 58* 53*   Basic Metabolic Panel:  Recent Labs Lab 02/15/16 1607  NA 136  K 3.9  CL 106  CO2 22  GLUCOSE 138*  BUN <5*  CREATININE 0.82  CALCIUM 8.2*   GFR: CrCl cannot be calculated (Unknown ideal weight.). Liver Function Tests:  Recent Labs Lab 02/15/16 1607  AST 76*  ALT 36  ALKPHOS 92  BILITOT 2.3*  PROT 7.2  ALBUMIN 2.2*    Recent Labs Lab 02/15/16 1607  LIPASE  57*   No results for input(s): AMMONIA in the last 168 hours. Coagulation Profile:  Recent Labs Lab 02/16/16 0138  INR 2.16   Cardiac Enzymes: No results for input(s): CKTOTAL, CKMB, CKMBINDEX, TROPONINI in the last 168 hours. BNP (last 3 results) No results for input(s): PROBNP in the last 8760 hours. HbA1C: No results for input(s): HGBA1C in the last 72 hours. CBG: No results for input(s): GLUCAP in the last 168 hours. Lipid Profile: No results for input(s): CHOL, HDL, LDLCALC, TRIG, CHOLHDL, LDLDIRECT in the last 72 hours. Thyroid Function Tests: No results for input(s): TSH, T4TOTAL, FREET4, T3FREE, THYROIDAB in the last 72 hours. Anemia Panel: No results for input(s): VITAMINB12, FOLATE, FERRITIN, TIBC, IRON, RETICCTPCT in the last 72 hours. Urine analysis:    Component Value Date/Time   COLORURINE YELLOW 02/15/2016 [place] 02/15/2016 1617   LABSPEC 1.009 02/15/2016 1617   PHURINE 5.0 02/15/2016 1617   GLUCOSEU NEGATIVE 02/15/2016 1617   HGBUR NEGATIVE 02/15/2016 1617   BILIRUBINUR NEGATIVE 02/15/2016 1617   KETONESUR NEGATIVE 02/15/2016 1617   PROTEINUR NEGATIVE 02/15/2016 1617   UROBILINOGEN 1.0 12/07/2014 1042   NITRITE NEGATIVE 02/15/2016 1617   LEUKOCYTESUR NEGATIVE 02/15/2016 1617   Sepsis Labs: @LABRCNTIP (procalcitonin:4,lacticidven:4) )No results found for this or any previous visit (from the past 240 hour(s)).   Radiological Exams on Admission: Ct Abdomen Pelvis W Contrast  Result Date: 02/16/2016 CLINICAL DATA:  Abdominal pain, known hernia. EXAM: CT ABDOMEN AND PELVIS WITH CONTRAST TECHNIQUE: Multidetector CT imaging of the abdomen and pelvis was performed using the standard protocol following bolus administration of intravenous contrast. CONTRAST:  178mL ISOVUE-300 IOPAMIDOL (ISOVUE-300) INJECTION 61% COMPARISON:  CT from 12/07/2014 FINDINGS: Lower chest: No acute abnormality. Hepatobiliary: Macronodular appearance of the hepatic  contour with patent umbilical vein and moderate volume of ascites in the abdomen and pelvis consistent with cirrhosis. No space-occupying mass of the liver. No biliary dilatation. The gallbladder is distended and there is trace adjacent pericholecystic fluid likely related to ascites. Pancreas: Unremarkable Spleen: No splenomegaly. Splenic calcification consistent with a granuloma. Small hilar splenule. Adrenals/Urinary Tract: Normal bilateral adrenal glands, kidneys and bladder. Stomach/Bowel: Stomach is contracted. Small perigastric varices. There is normal small bowel rotation. Mild proximal small bowel distention with fluid and air. No mechanical obstruction. Findings may be related to a mild enteritis or potentially ileus. Large bowel is unremarkable. Vascular/Lymphatic: Aortoiliac atherosclerosis without aneurysm. Patent portal, splenic and superior mesenteric veins. Patent celiac axis, SMA and IMA. No lymphadenopathy. Reproductive: Normal size prostate. Other: No free air. Stable midline omental fat containing supraumbilical hernia. Musculoskeletal: L5 spondylolysis with grade 1 spondylolisthesis. Screws noted across the sacroiliac joints bilaterally. IMPRESSION: 1. Findings in keeping with cirrhosis with moderate volume ascites, perigastric varices, patent umbilical vein but without space-occupying mass of the liver. 2. Mild distention of proximal small bowel loops possibly representing a mild enteritis or localized ileus. 3. Splenic  granuloma. 4. L5 spondylolysis with grade 1 spondylolisthesis. Sacroiliac joint fixation with screws bilaterally. 5. Small fat containing supraumbilical ventral hernia. Electronically Signed   By: Ashley Royalty M.D.   On: 02/16/2016 00:42     Assessment/Plan Principal Problem:   Rectal bleeding Active Problems:   Lower GI bleed   Thrombocytopenia (HCC)   Normochromic normocytic anemia   Alcohol abuse   Cirrhosis of liver (HCC)   GI bleed    1. Rectal bleeding -  could be lower GI but however since patient's CT scan shows which is concerning for perigastric varices and also ascites patient is placed on octreotide and Protonix and antibiotics. Follow CBC will keep patient nothing by mouth in anticipation of EGD. 2. Anemia - probably secondary to blood loss follow CBC. 3. Ascites, lower extremity edema and coagulopathy probably from cirrhosis - patient eventually will need diuretics. Will need paracentesis after workup for GI bleed for now patient is on empiric antibiotics for possible SBP. I have also ordered vitamin K for coagulopathy. Check acute hepatitis panel. Cirrhosis is most likely from alcoholism. 4. Abdominal pain likely from ascites. CT scan shows possible ileus but patient has no vomiting. Closely observe. 5. Alcohol abuse - patient is placed on CIWA protocol. 6. Thrombocytopenia probably from cirrhosis and alcoholism - follow CBC. 7. Elevated blood pressure - follow blood pressure trends. 8. Fat containing supraumbilical ventral hernia.   DVT prophylaxis: SCDs. Code Status: Full code.  Family Communication: Patient's wife.  Disposition Plan: Home.  Consults called: None.  Admission status: Observation.    Rise Patience MD [facility] Pager [phone].  If 7PM-7AM, please contact night-coverage [email or web address] Password [email or web address]  02/16/2016, 2:50 AM

## 2016-02-16 NOTE — ED Notes (Signed)
Patient is NPO at this time, No Diet was ordered.

## 2016-02-17 ENCOUNTER — Inpatient Hospital Stay (HOSPITAL_COMMUNITY): Payer: Self-pay | Admitting: Anesthesiology

## 2016-02-17 ENCOUNTER — Inpatient Hospital Stay (HOSPITAL_COMMUNITY): Payer: Self-pay

## 2016-02-17 ENCOUNTER — Encounter (HOSPITAL_COMMUNITY): Payer: Self-pay | Admitting: *Deleted

## 2016-02-17 ENCOUNTER — Other Ambulatory Visit (HOSPITAL_COMMUNITY): Payer: Self-pay

## 2016-02-17 ENCOUNTER — Encounter (HOSPITAL_COMMUNITY)
Admission: EM | Disposition: A | Discharge status: Home / Self Care | Payer: Self-pay | Source: Home / Self Care | Attending: Internal Medicine

## 2016-02-17 DIAGNOSIS — D696 Thrombocytopenia, unspecified: Secondary | ICD-10-CM

## 2016-02-17 DIAGNOSIS — F1010 Alcohol abuse, uncomplicated: Secondary | ICD-10-CM

## 2016-02-17 DIAGNOSIS — K922 Gastrointestinal hemorrhage, unspecified: Secondary | ICD-10-CM

## 2016-02-17 DIAGNOSIS — K7031 Alcoholic cirrhosis of liver with ascites: Secondary | ICD-10-CM

## 2016-02-17 HISTORY — PX: COLONOSCOPY: SHX5424

## 2016-02-17 HISTORY — PX: ESOPHAGOGASTRODUODENOSCOPY: SHX5428

## 2016-02-17 LAB — GLUCOSE, CAPILLARY
GLUCOSE-CAPILLARY: 91 mg/dL (ref 65–99)
GLUCOSE-CAPILLARY: 95 mg/dL (ref 65–99)
Glucose-Capillary: 108 mg/dL — ABNORMAL HIGH (ref 65–99)
Glucose-Capillary: 121 mg/dL — ABNORMAL HIGH (ref 65–99)
Glucose-Capillary: 99 mg/dL (ref 65–99)

## 2016-02-17 LAB — COMPREHENSIVE METABOLIC PANEL WITH GFR
ALT: 31 U/L (ref 17–63)
AST: 83 U/L — ABNORMAL HIGH (ref 15–41)
Albumin: 2 g/dL — ABNORMAL LOW (ref 3.5–5.0)
Alkaline Phosphatase: 78 U/L (ref 38–126)
Anion gap: 9 (ref 5–15)
BUN: 7 mg/dL (ref 6–20)
CO2: 22 mmol/L (ref 22–32)
Calcium: 8 mg/dL — ABNORMAL LOW (ref 8.9–10.3)
Chloride: 106 mmol/L (ref 101–111)
Creatinine, Ser: 0.84 mg/dL (ref 0.61–1.24)
GFR calc Af Amer: >60 mL/min
GFR calc non Af Amer: >60 mL/min
Glucose, Bld: 87 mg/dL (ref 65–99)
Potassium: 4.2 mmol/L (ref 3.5–5.1)
Sodium: 137 mmol/L (ref 135–145)
Total Bilirubin: 3.3 mg/dL — ABNORMAL HIGH (ref 0.3–1.2)
Total Protein: 6.6 g/dL (ref 6.5–8.1)

## 2016-02-17 LAB — CBC
HCT: 34.5 % — ABNORMAL LOW (ref 39.0–52.0)
Hemoglobin: 11.6 g/dL — ABNORMAL LOW (ref 13.0–17.0)
MCH: 26.7 pg (ref 26.0–34.0)
MCHC: 33.6 g/dL (ref 30.0–36.0)
MCV: 79.3 fL (ref 78.0–100.0)
Platelets: 45 10*3/uL — ABNORMAL LOW (ref 150–400)
RBC: 4.35 MIL/uL (ref 4.22–5.81)
RDW: 22.7 % — ABNORMAL HIGH (ref 11.5–15.5)
WBC: 3.7 10*3/uL — ABNORMAL LOW (ref 4.0–10.5)

## 2016-02-17 LAB — HEPATITIS PANEL, ACUTE
HEP A IGM: NEGATIVE
HEP B C IGM: NEGATIVE
HEP B S AG: NEGATIVE

## 2016-02-17 LAB — ECHOCARDIOGRAM COMPLETE: HEIGHTINCHES: 71 in

## 2016-02-17 SURGERY — EGD (ESOPHAGOGASTRODUODENOSCOPY)
Anesthesia: Monitor Anesthesia Care

## 2016-02-17 MED ORDER — SODIUM CHLORIDE 0.9 % IV SOLN
INTRAVENOUS | Status: DC | PRN
  Administered 2016-02-17: 11:00:00 via INTRAVENOUS

## 2016-02-17 MED ORDER — PROPOFOL 10 MG/ML IV BOLUS
INTRAVENOUS | Status: DC | PRN
  Administered 2016-02-17 (×2): 50 mg via INTRAVENOUS
  Administered 2016-02-17: 20 mg via INTRAVENOUS
  Administered 2016-02-17: 100 mg via INTRAVENOUS
  Administered 2016-02-17: 30 mg via INTRAVENOUS
  Administered 2016-02-17: 20 mg via INTRAVENOUS
  Administered 2016-02-17: 30 mg via INTRAVENOUS
  Administered 2016-02-17: 20 mg via INTRAVENOUS
  Administered 2016-02-17: 30 mg via INTRAVENOUS
  Administered 2016-02-17 (×2): 50 mg via INTRAVENOUS

## 2016-02-17 MED ORDER — LIDOCAINE HCL (CARDIAC) 20 MG/ML IV SOLN
INTRAVENOUS | Status: DC | PRN
  Administered 2016-02-17: 30 mg via INTRAVENOUS

## 2016-02-17 MED ORDER — FENTANYL CITRATE (PF) 100 MCG/2ML IJ SOLN: 25.0000 ug | INTRAMUSCULAR | Status: DC | PRN

## 2016-02-17 MED ORDER — LACTATED RINGERS IV SOLN
INTRAVENOUS | Status: DC | PRN
  Administered 2016-02-17: 12:00:00 via INTRAVENOUS

## 2016-02-17 MED ORDER — SODIUM CHLORIDE 0.9 % IV SOLN: INTRAVENOUS | Status: DC

## 2016-02-17 NOTE — Progress Notes (Signed)
[facility] PROGRESS NOTE  [name] [id number] DOB: 06/25/1950 DOA: 02/15/2016  PCP: No PCP Per Patient  Brief History/Interval Summary: [name] is a 66 y.o. male with history of alcoholism who has not been to a physician for many years presented to the ER because of abdominal pain and rectal bleeding. Patient has been having abdominal discomfort for last few months with off-and-on rectal bleeding. CT of the abdomen and pelvis shows features concerning for cirrhosis with moderate ascites and ventral hernia and perigastric varices.    Reason for Visit: Rectal bleeding  Consultants: Gastroenterology: Dr. Benson Norway  Procedures: None So far  Antibiotics: Ceftriaxone  Subjective/Interval History: Patient denies any further episodes of bleeding per rectum. Did have multiple bowel movements due to the prep for colonoscopy. Continues to have on and off abdominal pain, which he tells me has been ongoing for a year. Has any nausea or vomiting.   ROS: Denies any chest pain or shortness of breath.  Objective:  Vital Signs  Vitals:   02/16/16 1726 02/16/16 2114 02/17/16 0010 02/17/16 0640  BP: (!) 142/120 (!) 150/78 (!) 159/91 (!) 162/90  Pulse: 76 (!) 58 60 (!) 58  Resp: 18 18 18 18   Temp: 98 F (36.7 C) 97.8 F (36.6 C) 97.9 F (36.6 C) 97.9 F (36.6 C)  TempSrc: Oral Oral Oral Oral  SpO2: 97% 93% 94% 93%  Height: 5\' 11"  (1.803 m)       Intake/Output Summary (Last 24 hours) at 02/17/16 [id number] Last data filed at 02/17/16 [id number]  Gross per 24 hour  Intake          4657.08 ml  Output             2050 ml  Net          2607.08 ml   There were no vitals filed for this visit.  General appearance: alert, cooperative, appears stated age and no distress Resp: clear to auscultation bilaterally Cardio: regular rate and rhythm, S1, S2 normal, no murmur, click, rub or gallop GI: Mildly distended. No hepatomegaly. No other masses. Soft, nontender. Extremities: extremities  normal, atraumatic, no cyanosis or edema Neurologic: No focal deficits.  Lab Results:  Data Reviewed: I have personally reviewed following labs and imaging studies  CBC:  Recent Labs Lab 02/16/16 0128 02/16/16 0416 02/16/16 0657 02/16/16 0912 02/17/16 0625  WBC 4.4 5.0 4.4 4.1 3.7*  HGB 10.7* 10.4* 11.2* 10.8* 11.6*  HCT 31.5* 31.5* 33.6* 32.2* 34.5*  MCV 78.8 78.8 79.4 78.5 79.3  PLT 53* 52* 51* 50* 45*    Basic Metabolic Panel:  Recent Labs Lab 02/15/16 1607 02/16/16 0657 02/17/16 0625  NA 136 136 137  K 3.9 4.0 4.2  CL 106 108 106  CO2 22 22 22   GLUCOSE 138* 96 87  BUN <5* <5* 7  CREATININE 0.82 0.81 0.84  CALCIUM 8.2* 7.8* 8.0*    GFR: CrCl cannot be calculated (Unknown ideal weight.).  Liver Function Tests:  Recent Labs Lab 02/15/16 1607 02/16/16 0657 02/17/16 0625  AST 76* 76* 83*  ALT 36 33 31  ALKPHOS 92 75 78  BILITOT 2.3* 2.7* 3.3*  PROT 7.2 6.7 6.6  ALBUMIN 2.2* 2.0* 2.0*     Recent Labs Lab 02/15/16 1607  LIPASE 57*   Coagulation Profile:  Recent Labs Lab 02/16/16 0138  INR 2.16    CBG:  Recent Labs Lab 02/16/16 1351 02/16/16 2113 02/17/16 0010 02/17/16 0513 02/17/16 0812  GLUCAP 114* 136* 121* 95  99    Anemia Panel:  Recent Labs  02/16/16 0912  VITAMINB12 2,191*  FOLATE 12.2  FERRITIN 25  TIBC 276  IRON 66  RETICCTPCT 2.0    Radiology Studies: Ct Abdomen Pelvis W Contrast  Result Date: 02/16/2016 CLINICAL DATA:  Abdominal pain, known hernia. EXAM: CT ABDOMEN AND PELVIS WITH CONTRAST TECHNIQUE: Multidetector CT imaging of the abdomen and pelvis was performed using the standard protocol following bolus administration of intravenous contrast. CONTRAST:  146mL ISOVUE-300 IOPAMIDOL (ISOVUE-300) INJECTION 61% COMPARISON:  CT from 12/07/2014 FINDINGS: Lower chest: No acute abnormality. Hepatobiliary: Macronodular appearance of the hepatic contour with patent umbilical vein and moderate volume of ascites in the  abdomen and pelvis consistent with cirrhosis. No space-occupying mass of the liver. No biliary dilatation. The gallbladder is distended and there is trace adjacent pericholecystic fluid likely related to ascites. Pancreas: Unremarkable Spleen: No splenomegaly. Splenic calcification consistent with a granuloma. Small hilar splenule. Adrenals/Urinary Tract: Normal bilateral adrenal glands, kidneys and bladder. Stomach/Bowel: Stomach is contracted. Small perigastric varices. There is normal small bowel rotation. Mild proximal small bowel distention with fluid and air. No mechanical obstruction. Findings may be related to a mild enteritis or potentially ileus. Large bowel is unremarkable. Vascular/Lymphatic: Aortoiliac atherosclerosis without aneurysm. Patent portal, splenic and superior mesenteric veins. Patent celiac axis, SMA and IMA. No lymphadenopathy. Reproductive: Normal size prostate. Other: No free air. Stable midline omental fat containing supraumbilical hernia. Musculoskeletal: L5 spondylolysis with grade 1 spondylolisthesis. Screws noted across the sacroiliac joints bilaterally. IMPRESSION: 1. Findings in keeping with cirrhosis with moderate volume ascites, perigastric varices, patent umbilical vein but without space-occupying mass of the liver. 2. Mild distention of proximal small bowel loops possibly representing a mild enteritis or localized ileus. 3. Splenic granuloma. 4. L5 spondylolysis with grade 1 spondylolisthesis. Sacroiliac joint fixation with screws bilaterally. 5. Small fat containing supraumbilical ventral hernia. Electronically Signed   By: Ashley Royalty M.D.   On: 02/16/2016 00:42     Medications:  Scheduled: . cefTRIAXone (ROCEPHIN)  IV  2 g Intravenous Q0600  . folic acid  1 mg Oral Daily  . Influenza vac split quadrivalent PF  0.5 mL Intramuscular Tomorrow-1000  . LORazepam  0-4 mg Intravenous Q6H   Followed by  . [START ON 02/18/2016] LORazepam  0-4 mg Intravenous Q12H  .  multivitamin with minerals  1 tablet Oral Daily  . [START ON 02/19/2016] pantoprazole  40 mg Intravenous Q12H  . phytonadione  10 mg Oral Daily  . pneumococcal 23 valent vaccine  0.5 mL Intramuscular Tomorrow-1000  . thiamine  100 mg Oral Daily   Or  . thiamine  100 mg Intravenous Daily   Continuous: . pantoprozole (PROTONIX) infusion 8 mg/hr (02/17/16 [id number])   [id number] **OR** acetaminophen, HYDROmorphone (DILAUDID) injection, LORazepam **OR** LORazepam, ondansetron **OR** ondansetron (ZOFRAN) IV  Assessment/Plan:  Principal Problem:   Rectal bleeding Active Problems:   Lower GI bleed   Thrombocytopenia (HCC)   Normochromic normocytic anemia   Alcohol abuse   Cirrhosis of liver (HCC)   GI bleed    Rectal bleeding This is been an ongoing issue for at least the last few weeks. Appears to be lower GI bleed. No appreciable drop in hemoglobin. CT scan does suggest perigastric varices along with ascites. Patient was placed on octreotide and PPI infusion. Gastroenterology was consulted. Patient to undergo EGD and colonoscopy today. Octreotide infusion has been discontinued. Continue with ceftriaxone for now.   Acute blood loss anemia  Likely secondary to rectal bleeding.  Hemoglobin did not drop significantly. Monitor closely. Anemia panel reviewed. No indication for blood transfusion at this time.  Ascites, lower extremity edema and coagulopathy probably from cirrhosis Most likely alcoholic liver disease. Hepatitis panel is positive for hepatitis C antibody. Vitamin K for coagulopathy. Patient counseled to stop drinking alcohol.   Hepatitis C antibody positive Will need outpatient evaluation and possible treatment.  Abdominal pain Apparently this has been ongoing for at least a year. So, this is chronic. Could be from his ascites and liver cirrhosis. CT scan shows possible ileus but patient has no vomiting. Continue ceftriaxone. Consider diagnostic paracentesis.  Alcohol  abuse Patient is placed on CIWA protocol. Stable without any signs of withdrawal.  Thrombocytopenia  Probably from cirrhosis and alcoholism. Continue to monitor counts.  Elevated blood pressure Blood pressures have improved. Continue to monitor.   Fat containing supraumbilical ventral hernia. Abdomen is benign. Outpatient monitoring.  DVT Prophylaxis: SCDs    Code Status: Full code  Family Communication: Discussed with patient  Disposition Plan: Await endoscopies today. Diagnostic paracentesis tomorrow.    LOS: 1 day   [facility] Pager [phone] 02/17/2016, 9:03 AM  If 7PM-7AM, please contact night-coverage at [email or web address], password [facility]

## 2016-02-17 NOTE — Progress Notes (Signed)
  Echocardiogram 2D Echocardiogram has been performed.  Jennette Dubin 02/17/2016, 3:40 PM

## 2016-02-17 NOTE — Transfer of Care (Signed)
Immediate Anesthesia Transfer of Care Note  Patient: [name]  Procedure(s) Performed: Procedure(s): ESOPHAGOGASTRODUODENOSCOPY (EGD) (N/A) COLONOSCOPY (N/A)  Patient Location: Endoscopy Unit  Anesthesia Type:MAC  Level of Consciousness: awake, alert  and oriented  Airway & Oxygen Therapy: Patient Spontanous Breathing and Patient connected to nasal cannula oxygen  Post-op Assessment: Report given to RN, Post -op Vital signs reviewed and stable and Patient moving all extremities X 4  Post vital signs: Reviewed and stable  Last Vitals:  Vitals:   02/17/16 0640 02/17/16 1046  BP: (!) 162/90 (!) 165/89  Pulse: (!) 58 (!) 59  Resp: 18 12  Temp: 36.6 C 36.3 C    Last Pain:  Vitals:   02/17/16 1046  TempSrc: Oral  PainSc: 3       Patients Stated Pain Goal: 2 (35/45/62 5638)  Complications: No apparent anesthesia complications

## 2016-02-17 NOTE — Op Note (Signed)
[facility] Patient Name: [name] Procedure Date : 02/17/2016 MRN: [id number] Attending MD: [name] , MD Date of Birth: Mar 21, 1950 CSN: [id number] Age: 66 Admit Type: Inpatient Procedure:                Upper GI endoscopy Indications:              Hematochezia, Heme positive stool Providers:                [name], MD, Cleda Daub, RN, Cherylynn Ridges,                            Technician, Elige Ko CRNA, CRNA Referring MD:              Medicines:                Propofol per Anesthesia Complications:            No immediate complications. Estimated Blood Loss:     Estimated blood loss: none. Procedure:                Pre-Anesthesia Assessment:                           - Prior to the procedure, a History and Physical                            was performed, and patient medications and                            allergies were reviewed. The patient's tolerance of                            previous anesthesia was also reviewed. The risks                            and benefits of the procedure and the sedation                            options and risks were discussed with the patient.                            All questions were answered, and informed consent                            was obtained. Prior Anticoagulants: The patient has                            taken no previous anticoagulant or antiplatelet                            agents. ASA Grade Assessment: III - A patient with                            severe systemic disease. After reviewing the risks  and benefits, the patient was deemed in                            satisfactory condition to undergo the procedure.                           - Sedation was administered by an anesthesia                            professional. Deep sedation was attained.                           After obtaining informed consent, the endoscope was   passed under direct vision. Throughout the                            procedure, the patient's blood pressure, pulse, and                            oxygen saturations were monitored continuously. The                            [id number] scope was introduced through                            the mouth, and advanced to the third part of                            duodenum. The upper GI endoscopy was accomplished                            without difficulty. The patient tolerated the                            procedure well. Scope In: Scope Out: Findings:      The esophagus was normal.      Mild portal hypertensive gastropathy was found in the entire examined       stomach.      The examined duodenum was normal.      No evidence of esophageal or fundic varices. Impression:               - Normal esophagus.                           - Portal hypertensive gastropathy.                           - Normal examined duodenum.                           - No specimens collected. Moderate Sedation:      None Recommendation:           - Return patient to hospital ward for ongoing care.                           - Resume  regular diet.                           - Continue present medications. Procedure Code(s):        --- Professional ---                           [phone], Esophagogastroduodenoscopy, flexible,                            transoral; diagnostic, including collection of                            specimen(s) by brushing or washing, when performed                            (separate procedure) Diagnosis Code(s):        --- Professional ---                           K76.6, Portal hypertension                           K31.89, Other diseases of stomach and duodenum                           K92.1, Melena (includes Hematochezia)                           R19.5, Other fecal abnormalities CPT copyright 2016 [employer]. All rights reserved. The codes  documented in this report are preliminary and upon coder review may  be revised to meet current compliance requirements. [name], MD [name], MD 02/17/2016 12:21:56 PM This report has been signed electronically. Number of Addenda: 0

## 2016-02-17 NOTE — Op Note (Signed)
[facility] Patient Name: [name] Procedure Date : 02/17/2016 MRN: [id number] Attending MD: [name] , MD Date of Birth: 30-Nov-1950 CSN: [id number] Age: 66 Admit Type: Inpatient Procedure:                Colonoscopy Indications:              Hematochezia Providers:                [name], MD, Cleda Daub, RN, Cherylynn Ridges,                            Technician, Neldon Newport CRNA, CRNA Referring MD:              Medicines:                Propofol per Anesthesia Complications:            No immediate complications. Estimated Blood Loss:     Estimated blood loss was minimal. Procedure:                Pre-Anesthesia Assessment:                           - Prior to the procedure, a History and Physical                            was performed, and patient medications and                            allergies were reviewed. The patient's tolerance of                            previous anesthesia was also reviewed. The risks                            and benefits of the procedure and the sedation                            options and risks were discussed with the patient.                            All questions were answered, and informed consent                            was obtained. Prior Anticoagulants: The patient has                            taken no previous anticoagulant or antiplatelet                            agents. ASA Grade Assessment: III - A patient with                            severe systemic disease. After reviewing the risks  and benefits, the patient was deemed in                            satisfactory condition to undergo the procedure.                           - Sedation was administered by an anesthesia                            professional. Deep sedation was attained.                           After obtaining informed consent, the colonoscope                            was passed under direct  vision. Throughout the                            procedure, the patient's blood pressure, pulse, and                            oxygen saturations were monitored continuously. The                            [id number] scope was introduced through                            the anus and advanced to the the cecum, identified                            by appendiceal orifice and ileocecal valve. The                            colonoscopy was performed without difficulty. The                            patient tolerated the procedure well. The quality                            of the bowel preparation was good. The ileocecal                            valve, appendiceal orifice, and rectum were                            photographed. Scope In: 11:30:05 AM Scope Out: [id number] PM Scope Withdrawal Time: 0 hours 26 minutes 33 seconds  Total Procedure Duration: 0 hours 36 minutes 49 seconds  Findings:      Seven sessile polyps were found in the transverse colon, ascending colon       and cecum. The polyps were 3 to 7 mm in size. These polyps were removed       with a cold snare. Resection and retrieval were complete.      A 10 mm polyp was found in the rectum.  The polyp was semi-sessile. The       polyp was removed with a hot snare. Resection and retrieval were       complete. To prevent bleeding post-intervention, one hemostatic clip was       successfully placed (MR unsafe). There was no bleeding at the end of the       procedure.      The largest transverse colon polyp measuring 7 mm was removed with a       cold snare and there was long than anticipated oozing from that site.       Two hemoclips were placed across the site and the bleeding was arrested.       In the rectum, in the decompressed state, there was the suspicion of       possible rectal varices. This may be the source of the recent       hematochezia. Impression:               - Seven 3 to 7 mm polyps in the  transverse colon,                            in the ascending colon and in the cecum, removed                            with a cold snare. Resected and retrieved.                           - One 10 mm polyp in the rectum, removed with a hot                            snare. Resected and retrieved. Clip (MR unsafe) was                            placed. Moderate Sedation:      None Recommendation:           - Return patient to hospital ward for ongoing care.                           - Resume regular diet.                           - Continue present medications.                           - Await pathology results.                           - Repeat colonoscopy in 3 years for surveillance. Procedure Code(s):        --- Professional ---                           [phone], Colonoscopy, flexible; with removal of                            tumor(s), polyp(s), or other lesion(s) by snare  technique Diagnosis Code(s):        --- Professional ---                           D12.3, Benign neoplasm of transverse colon (hepatic                            flexure or splenic flexure)                           D12.2, Benign neoplasm of ascending colon                           D12.0, Benign neoplasm of cecum                           K62.1, Rectal polyp                           K92.1, Melena (includes Hematochezia) CPT copyright 2016 [employer]. All rights reserved. The codes documented in this report are preliminary and upon coder review may  be revised to meet current compliance requirements. [name], MD [name], MD 02/17/2016 12:18:59 PM This report has been signed electronically. Number of Addenda: 0
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
## 2016-02-17 NOTE — Interval H&P Note (Signed)
History and Physical Interval Note:  02/17/2016 11:07 AM  [name]  has presented today for surgery, with the diagnosis of Rectal bleeding, Alcohol abuse, cirrhosis.  The various methods of treatment have been discussed with the patient and family. After consideration of risks, benefits and other options for treatment, the patient has consented to  Procedure(s): ESOPHAGOGASTRODUODENOSCOPY (EGD) (N/A) COLONOSCOPY (N/A) as a surgical intervention .  The patient's history has been reviewed, patient examined, no change in status, stable for surgery.  I have reviewed the patient's chart and labs.  Questions were answered to the patient's satisfaction.     Camil Wilhelmsen D

## 2016-02-17 NOTE — Anesthesia Preprocedure Evaluation (Addendum)
Anesthesia Evaluation  Patient identified by MRN, date of birth, ID band Patient awake    Reviewed: Allergy & Precautions, H&P , NPO status , Patient's Chart, lab work & pertinent test results  Airway Mallampati: II  TM Distance: >3 FB Neck ROM: Full    Dental no notable dental hx. (+) Upper Dentures, Lower Dentures, Dental Advisory Given   Pulmonary neg pulmonary ROS, former smoker,    Pulmonary exam normal breath sounds clear to auscultation       Cardiovascular hypertension,  Rhythm:Regular Rate:Normal     Neuro/Psych negative neurological ROS  negative psych ROS   GI/Hepatic negative GI ROS, (+) Cirrhosis     substance abuse  alcohol use,   Endo/Other  diabetes  Renal/GU negative Renal ROS  negative genitourinary   Musculoskeletal  (+) Arthritis , Osteoarthritis,    Abdominal   Peds  Hematology negative hematology ROS (+) anemia ,   Anesthesia Other Findings   Reproductive/Obstetrics negative OB ROS                            Anesthesia Physical Anesthesia Plan  ASA: III  Anesthesia Plan: MAC   Post-op Pain Management:    Induction: Intravenous  Airway Management Planned: Nasal Cannula  Additional Equipment:   Intra-op Plan:   Post-operative Plan:   Informed Consent: I have reviewed the patients History and Physical, chart, labs and discussed the procedure including the risks, benefits and alternatives for the proposed anesthesia with the patient or authorized representative who has indicated his/her understanding and acceptance.   Dental advisory given  Plan Discussed with: CRNA  Anesthesia Plan Comments:         Anesthesia Quick Evaluation

## 2016-02-17 NOTE — Anesthesia Preprocedure Evaluation (Addendum)
Anesthesia Evaluation   Patient awake    Reviewed: Allergy & Precautions, NPO status , Patient's Chart, lab work & pertinent test results  Airway Mallampati: I  TM Distance: >3 FB Neck ROM: full    Dental  (+) Dental Advidsory Given, Upper Dentures, Lower Dentures   Pulmonary former smoker,           Cardiovascular hypertension,  Rhythm:regular     Neuro/Psych    GI/Hepatic   Endo/Other  diabetes  Renal/GU      Musculoskeletal  (+) Arthritis ,   Abdominal   Peds  Hematology  (+) anemia ,   Anesthesia Other Findings - Normal mitral inflow. Filling pressures normal. Overall left    ventricular systolic function was normal. Left ventricular    ejection fraction was estimated , range being 65 % to 70 %.    Left ventricular wall thickness was mildly increased.  Reproductive/Obstetrics                            Anesthesia Physical Anesthesia Plan  ASA:   Anesthesia Plan:    Post-op Pain Management:    Induction:   Airway Management Planned:   Additional Equipment:   Intra-op Plan:   Post-operative Plan:   Informed Consent:   Dental Advisory Given  Plan Discussed with: CRNA and Anesthesiologist  Anesthesia Plan Comments:         Anesthesia Quick Evaluation

## 2016-02-17 NOTE — Anesthesia Postprocedure Evaluation (Signed)
Anesthesia Post Note  Patient: [name]  Procedure(s) Performed: Procedure(s) (LRB): ESOPHAGOGASTRODUODENOSCOPY (EGD) (N/A) COLONOSCOPY (N/A)  Patient location during evaluation: PACU Anesthesia Type: MAC Level of consciousness: awake and alert Pain management: pain level controlled Vital Signs Assessment: post-procedure vital signs reviewed and stable Respiratory status: spontaneous breathing, nonlabored ventilation and respiratory function stable Cardiovascular status: stable and blood pressure returned to baseline Anesthetic complications: no       Last Vitals:  Vitals:   02/17/16 1218 02/17/16 1220  BP: 130/82 130/82  Pulse: 68 68  Resp: 13 12  Temp: 36.4 C     Last Pain:  Vitals:   02/17/16 1218  TempSrc: Oral  PainSc:                  Jinny Sweetland,W. EDMOND

## 2016-02-18 ENCOUNTER — Encounter (HOSPITAL_COMMUNITY): Payer: Self-pay | Admitting: Gastroenterology

## 2016-02-18 ENCOUNTER — Inpatient Hospital Stay (HOSPITAL_COMMUNITY): Payer: Self-pay

## 2016-02-18 DIAGNOSIS — R768 Other specified abnormal immunological findings in serum: Secondary | ICD-10-CM | POA: Diagnosis present

## 2016-02-18 LAB — CBC
HCT: 31.4 % — ABNORMAL LOW (ref 39.0–52.0)
Hemoglobin: 10.2 g/dL — ABNORMAL LOW (ref 13.0–17.0)
MCH: 26 pg (ref 26.0–34.0)
MCHC: 32.5 g/dL (ref 30.0–36.0)
MCV: 80.1 fL (ref 78.0–100.0)
PLATELETS: 46 10*3/uL — AB (ref 150–400)
RBC: 3.92 MIL/uL — AB (ref 4.22–5.81)
RDW: 22.8 % — AB (ref 11.5–15.5)
WBC: 3.7 10*3/uL — AB (ref 4.0–10.5)

## 2016-02-18 LAB — COMPREHENSIVE METABOLIC PANEL
ALT: 30 U/L (ref 17–63)
AST: 72 U/L — AB (ref 15–41)
Albumin: 1.8 g/dL — ABNORMAL LOW (ref 3.5–5.0)
Alkaline Phosphatase: 85 U/L (ref 38–126)
Anion gap: 4 — ABNORMAL LOW (ref 5–15)
BUN: 6 mg/dL (ref 6–20)
CALCIUM: 7.8 mg/dL — AB (ref 8.9–10.3)
CO2: 27 mmol/L (ref 22–32)
CREATININE: 0.89 mg/dL (ref 0.61–1.24)
Chloride: 106 mmol/L (ref 101–111)
Glucose, Bld: 102 mg/dL — ABNORMAL HIGH (ref 65–99)
Potassium: 3.4 mmol/L — ABNORMAL LOW (ref 3.5–5.1)
Sodium: 137 mmol/L (ref 135–145)
Total Bilirubin: 2 mg/dL — ABNORMAL HIGH (ref 0.3–1.2)
Total Protein: 6.1 g/dL — ABNORMAL LOW (ref 6.5–8.1)

## 2016-02-18 LAB — LACTATE DEHYDROGENASE: LDH: 232 U/L — AB (ref 98–192)

## 2016-02-18 LAB — PROTIME-INR
INR: 2.3
PROTHROMBIN TIME: 25.7 s — AB (ref 11.4–15.2)

## 2016-02-18 LAB — GLUCOSE, CAPILLARY
Glucose-Capillary: 107 mg/dL — ABNORMAL HIGH (ref 65–99)
Glucose-Capillary: 97 mg/dL (ref 65–99)

## 2016-02-18 MED ORDER — THIAMINE HCL 100 MG PO TABS: 100.0000 mg | ORAL_TABLET | Freq: Every day | ORAL | 0 refills | Status: DC

## 2016-02-18 MED ORDER — LIDOCAINE HCL (PF) 1 % IJ SOLN
INTRAMUSCULAR | Status: AC
  Filled 2016-02-18: qty 10

## 2016-02-18 MED ORDER — OXYCODONE-ACETAMINOPHEN 5-325 MG PO TABS: 1.0000 | ORAL_TABLET | Freq: Three times a day (TID) | ORAL | 0 refills | Status: DC | PRN

## 2016-02-18 MED ORDER — SPIRONOLACTONE 50 MG PO TABS: 50.0000 mg | ORAL_TABLET | Freq: Every day | ORAL | 0 refills | Status: DC

## 2016-02-18 MED ORDER — CIPROFLOXACIN HCL 500 MG PO TABS: 500.0000 mg | ORAL_TABLET | Freq: Two times a day (BID) | ORAL | 0 refills | Status: DC

## 2016-02-18 MED ORDER — CIPROFLOXACIN HCL 500 MG PO TABS
500.0000 mg | ORAL_TABLET | Freq: Two times a day (BID) | ORAL | Status: DC
  Administered 2016-02-18: 500 mg via ORAL
  Filled 2016-02-18: qty 1

## 2016-02-18 MED ORDER — ADULT MULTIVITAMIN W/MINERALS CH
1.0000 | ORAL_TABLET | Freq: Every day | ORAL | 0 refills | Status: DC
Start: 2016-02-18 — End: 2017-01-19

## 2016-02-18 NOTE — Discharge Instructions (Signed)
Cirrhosis °Cirrhosis is long-term (chronic) liver injury. The liver is your largest internal organ, and it performs many functions. The liver converts food into energy, removes toxic material from your blood, makes important proteins, and absorbs necessary vitamins from your diet. °If you have cirrhosis, it means many of your healthy liver cells have been replaced by scar tissue. This prevents blood from flowing through your liver, which makes it difficult for your liver to function. This scarring is not reversible, but treatment can prevent it from getting worse. °What are the causes? °Hepatitis C and long-term alcohol abuse are the most common causes of cirrhosis. Other causes include: °· Nonalcoholic fatty liver disease. °· Hepatitis B infection. °· Autoimmune hepatitis. °· Diseases that cause blockage of ducts inside the liver. °· Inherited liver diseases. °· Reactions to certain long-term medicines. °· Parasitic infections. °· Long-term exposure to certain toxins. °What increases the risk? °You may have a higher risk of cirrhosis if you: °· Have certain hepatitis viruses. °· Abuse alcohol, especially if you are male. °· Are overweight. °· Share needles. °· Have unprotected sex with someone who has hepatitis. °What are the signs or symptoms? °You may not have any signs and symptoms at first. Symptoms may not develop until the damage to your liver starts to get worse. Signs and symptoms of cirrhosis may include: °· Tenderness in the right-upper part of your abdomen. °· Weakness and tiredness (fatigue). °· Loss of appetite. °· Nausea. °· Weight loss and muscle loss. °· Itchiness. °· Yellow skin and eyes (jaundice). °· Buildup of fluid in the abdomen (ascites). °· Swelling of the feet and ankles (edema). °· Appearance of tiny blood vessels under the skin. °· Mental confusion. °· Easy bruising and bleeding. °How is this diagnosed? °Your health care provider may suspect cirrhosis based on your symptoms and medical  history, especially if you have other medical conditions or a history of alcohol abuse. Your health care provider will do a physical exam to feel your liver and check for signs of cirrhosis. Your health care provider may perform other tests, including: °· Blood tests to check: °¨ Whether you have hepatitis B or C. °¨ Kidney function. °¨ Liver function. °· Imaging tests such as: °¨ MRI or CT scan to look for changes seen in advanced cirrhosis. °¨ Ultrasound to see if normal liver tissue is being replaced by scar tissue. °· A procedure using a long needle to take a sample of liver tissue (biopsy) for examination under a microscope. Liver biopsy can confirm the diagnosis of cirrhosis. °How is this treated? °Treatment depends on how damaged your liver is and what caused the damage. Treatment may include treating cirrhosis symptoms or treating the underlying causes of the condition to try to slow the progression of the damage. Treatment may include: °· Making lifestyle changes, such as: °¨ Eating a healthy diet. °¨ Restricting salt intake. °¨ Maintaining a healthy weight. °¨ Not abusing drugs or alcohol. °· Taking medicines to: °¨ Treat liver infections or other infections. °¨ Control itching. °¨ Reduce fluid buildup. °¨ Reduce certain blood toxins. °¨ Reduce risk of bleeding from enlarged blood vessels in the stomach or esophagus (varices). °· If varices are causing bleeding problems, you may need treatment with a procedure that ties up the vessels causing them to fall off (band ligation). °· If cirrhosis is causing your liver to fail, your health care provider may recommend a liver transplant. °· Other treatments may be recommended depending on any complications of cirrhosis, such as   liver-related kidney failure (hepatorenal syndrome). Follow these instructions at home:  Take medicines only as directed by your health care provider. Do not use drugs that are toxic to your liver. Ask your health care provider before  taking any new medicines, including over-the-counter medicines.  Rest as needed.  Eat a well-balanced diet. Ask your health care provider or dietitian for more information.  You may have to follow a low-salt diet or restrict your water intake as directed.  Do not drink alcohol. This is especially important if you are taking acetaminophen.  Keep all follow-up visits as directed by your health care provider. This is important. Contact a health care provider if:  You have fatigue or weakness that is getting worse.  You develop swelling of the hands, feet, legs, or face.  You have a fever.  You develop loss of appetite.  You have nausea or vomiting.  You develop jaundice.  You develop easy bruising or bleeding. Get help right away if:  You vomit bright red blood or a material that looks like coffee grounds.  You have blood in your stools.  Your stools appear black and tarry.  You become confused.  You have chest pain or trouble breathing. This information is not intended to replace advice given to you by your health care provider. Make sure you discuss any questions you have with your health care provider. Document Released: 01/09/2005 Document Revised: 05/20/2015 Document Reviewed: 09/17/2013 Elsevier Interactive Patient Education  2017 [employer]   Alcoholic Liver Disease Introduction Alcoholic liver disease happens when the liver does not work the way it should. The condition is caused by drinking too much alcohol for many years. Follow these instructions at home:  Do not drink alcohol.  Take medicines only as told by your doctor.  Take vitamins only as told by your doctor.  Follow any diet instructions that your doctor gave you. You may need to:  Eat foods that have thiamine. These include whole-wheat cereals, pork, and raw vegetables.  Eat foods that have folic acid. These include vegetables, fruits, meats, beans, nuts, and dairy foods.  Eat foods that  are high in carbohydrates. These include yogurt, beans, potatoes, and rice. Contact a doctor if:  You have a fever.  You are short of breath.  You have trouble breathing.  You have bright red blood in your poop (stool).  Your poop looks like tar.  You throw up (vomit) blood.  Your skin looks more yellow, pale, or dark.  You get headaches.  You have trouble thinking.  You have trouble balancing or walking. This information is not intended to replace advice given to you by your health care provider. Make sure you discuss any questions you have with your health care provider. Document Released: 11/06/2008 Document Revised: 06/17/2015 Document Reviewed: 12/11/2013  2017 Elsevier   Hepatitis C Hepatitis C is a viral infection of the liver. It can lead to scarring of the liver (cirrhosis), liver failure, or liver cancer. Hepatitis C may go undetected for months or years because people with the infection may not have symptoms, or they may have only mild symptoms. What are the causes? Hepatitis C is caused by the hepatitis C virus (HCV). The virus can be passed from one person to another through:  Blood.  Contaminated needles, such as those used for tattooing, body piercing, acupuncture, or injecting drugs.  Having unprotected sex with an infected person.  Childbirth.  Blood transfusions or organ transplants done in the [place] before  1992. What increases the risk? Risk factors for hepatitis C include:  Having unprotected sex with an infected person.  Using illegal drugs. What are the signs or symptoms? Symptoms of hepatitis C may include:  Fatigue.  Loss of appetite.  Nausea.  Vomiting.  Abdominal pain.  Dark yellow urine.  Yellowish skin and eyes (jaundice).  Itching of the skin.  Clay-colored bowel movements.  Joint pain. Symptoms are not always present. How is this diagnosed? Hepatitis C is diagnosed with blood tests. Other types of tests may  also be done to check how your liver is functioning. How is this treated? Your health care provider may perform noninvasive tests or a liver biopsy to help determine the best course of treatment. Treatment for hepatitis C may include one or more medicines. Your health care provider may check you for a recurring infection or other liver conditions every 6-12 months after treatment. Follow these instructions at home:  Rest as needed.  Take all medicines as directed by your health care provider.  Do not take any medicine unless approved by your health care provider. This includes over-the-counter medicine and birth control pills.  Do not drink alcohol.  Do not have sex until approved by your health care provider.  Do not share toothbrushes, nail clippers, razors, or needles. How is this prevented? There is no vaccine for hepatitis C. The only way to prevent the disease is to reduce the risk of exposure to the virus. This may be done by:  Practicing safe sex and using condoms.  Avoiding illegal drugs. Contact a health care provider if:  You have a fever.  You develop abdominal pain.  You develop dark urine.  You have clay-colored bowel movements.  You develop joint pains. Get help right away if:  You have increasing fatigue or weakness.  You lose your appetite.  You feel nauseous or vomit.  You develop jaundice or your jaundice gets worse.  You bruise or bleed easily. This information is not intended to replace advice given to you by your health care provider. Make sure you discuss any questions you have with your health care provider. Document Released: 01/07/2000 Document Revised: 06/17/2015 Document Reviewed: 04/23/2013 Elsevier Interactive Patient Education  2017 [employer].

## 2016-02-18 NOTE — Progress Notes (Signed)
Subjective: No complaints.  Objective: Vital signs in last 24 hours: Temp:  [97.4 F (36.3 C)-98.1 F (36.7 C)] 97.4 F (36.3 C) (01/26 0618) Pulse Rate:  [56-76] 71 (01/26 0618) Resp:  [11-21] 18 (01/26 0618) BP: (129-165)/(82-109) 146/89 (01/26 0618) SpO2:  [94 %-99 %] 98 % (01/26 0618) Last BM Date: 02/18/16  Intake/Output from previous day: 01/25 0701 - 01/26 0700 In: 729.6 [I.V.:729.6] Out: 1 [Blood:1] Intake/Output this shift: No intake/output data recorded.  General appearance: alert and no distress GI: soft, non-tender; bowel sounds normal; no masses,  no organomegaly  Lab Results:  Recent Labs  02/16/16 0912 02/17/16 0625 02/18/16 0519  WBC 4.1 3.7* 3.7*  HGB 10.8* 11.6* 10.2*  HCT 32.2* 34.5* 31.4*  PLT 50* 45* 46*   BMET  Recent Labs  02/16/16 0657 02/17/16 0625 02/18/16 0519  NA 136 137 137  K 4.0 4.2 3.4*  CL 108 106 106  CO2 22 22 27   GLUCOSE 96 87 102*  BUN <5* 7 6  CREATININE 0.81 0.84 0.89  CALCIUM 7.8* 8.0* 7.8*   LFT  Recent Labs  02/18/16 0519  PROT 6.1*  ALBUMIN 1.8*  AST 72*  ALT 30  ALKPHOS 85  BILITOT 2.0*   PT/INR  Recent Labs  02/16/16 0138 02/18/16 0519  LABPROT 24.5* 25.7*  INR 2.16 2.30   Hepatitis Panel  Recent Labs  02/16/16 0416  HEPBSAG Negative  HCVAB >11.0*  HEPAIGM Negative  HEPBIGM Negative   C-Diff No results for input(s): CDIFFTOX in the last 72 hours. Fecal Lactopherrin No results for input(s): FECLLACTOFRN in the last 72 hours.  Studies/Results: No results found.  Medications:  Scheduled: . lidocaine (PF)      . cefTRIAXone (ROCEPHIN)  IV  2 g Intravenous Q0600  . folic acid  1 mg Oral Daily  . Influenza vac split quadrivalent PF  0.5 mL Intramuscular Tomorrow-1000  . LORazepam  0-4 mg Intravenous Q12H  . multivitamin with minerals  1 tablet Oral Daily  . [START ON 02/19/2016] pantoprazole  40 mg Intravenous Q12H  . phytonadione  10 mg Oral Daily  . thiamine  100 mg Oral Daily    Or  . thiamine  100 mg Intravenous Daily   Continuous: . pantoprozole (PROTONIX) infusion 8 mg/hr (02/17/16 2129)    Assessment/Plan: 1) Possible rectal varices (subjective opinion) as source for hematochezia. 2) Colonic polyps s/p resection - no post polypectomy bleed with his coagulopathy and severe thrombocytopenia. 3) ETOH Cirrhosis. 4) ETOH abuse.  Plan: 1) Stop drinking ETOH. 2) I will follow up on the pathology for the polyps. 3) Signing off.   LOS: 2 days   [name] D 02/18/2016, 8:44 AM

## 2016-02-18 NOTE — Discharge Summary (Signed)
[facility]  Physician Discharge Summary   Patient ID: [name] MRN: [id number] DOB/AGE: [birth year] 66 y.o.  Admit date: 02/15/2016 Discharge date: 02/18/2016  PCP: No PCP Per Patient  DISCHARGE DIAGNOSES:  Principal Problem:   Rectal bleeding Active Problems:   Lower GI bleed   Thrombocytopenia (HCC)   Normochromic normocytic anemia   Alcohol abuse   Cirrhosis of liver (HCC)   GI bleed   RECOMMENDATIONS FOR OUTPATIENT FOLLOW UP: 1. [facility] and that he needs to call them on Monday to schedule an appointment 2. It has been emphasized to him that he needs to stop drinking alcohol   DISCHARGE CONDITION: fair  Diet recommendation: Low sodium  INITIAL HISTORY: [name] a 66 y.o.malewith history of alcoholism whohas not been to a physician for many years presented to the ER because of abdominal pain and rectal bleeding. Patient has been having abdominal discomfort for last few months with off-and-on rectal bleeding. CT of the abdomen and pelvis shows features concerning for cirrhosis with moderate ascites and ventral hernia and perigastric varices.  Consultations: Gastroenterology: Dr. Benson Norway  Procedures: Colonoscopy Impression:                - Seven 3 to 7 mm polyps in the transverse colon, in the ascending colon and in the cecum, removed with a cold snare. Resected and retrieved. - One 10 mm polyp in the rectum, removed with a hot snare. Resected and retrieved. Clip (MR unsafe) was placed.  EGD Impression: - Normal esophagus. - Portal hypertensive gastropathy. - Normal examined duodenum. - No specimens collected.  Transthoracic echocardiogram Study Conclusions  - Left ventricle: The cavity size was normal. Wall thickness was   increased in a pattern of mild LVH. Systolic function was normal.   The estimated ejection fraction was in the range of 55% to 60%.   Wall motion was  normal; there were no regional wall motion   abnormalities. - Aortic valve: There was trivial regurgitation. - Aortic root: The aortic root was mildly dilated. - Left atrium: The atrium was mildly dilated.  HOSPITAL COURSE:   Rectal bleeding This is been an ongoing issue for at least the last few weeks. Appears to be lower GI bleed. No appreciable drop in hemoglobin. CT scan does suggest perigastric varices along with ascites. Patient was placed on octreotide and PPI infusion. Gastroenterology was consulted. Patient underwent EGD and colonoscopy. Reports as above. Bleeding thought to be hemorrhoidal. No recurrence in the hospital.   Acute blood loss anemia  Likely secondary to rectal bleeding. Hemoglobin did not drop significantly. Anemia panel reviewed.   Ascites, lower extremity edema and coagulopathy probably from cirrhosis Most likely alcoholic liver disease. Hepatitis panel is positive for hepatitis C antibody. Vitamin K given for coagulopathy. Patient counseled to stop drinking alcohol. Patient does have pedal edema. Echocardiogram showed normal systolic function. Edema is most likely due to cirrhosis. He does have hypoalbuminemia. He'll be given spironolactone.  Hepatitis C antibody positive Will need outpatient evaluation and possible treatment.  Abdominal pain Apparently this has been ongoing for at least a year. So, this is chronic. Could be from his ascites and liver cirrhosis. CT scan shows possible ileus but patient has no vomiting. Patient was placed on ceftriaxone due to concern for GI bleed in the setting of liver cirrhosis. Paracentesis was considered. However, ultrasound showed only minimal ascites and not enough for it to be tapped. Patient will be discharged on a  few more days of ciprofloxacin.   Alcohol abuse Patient was placed on CIWA protocol. Stable without any signs of withdrawal.  Thrombocytopenia Probably from cirrhosis and alcoholism.   Elevated blood  pressure Blood pressures have improved. Though some readings are still in the hypertensive range. We need outpatient monitoring. Diuretics only for now.  Fat containing supraumbilical ventral hernia. Abdomen is benign. Outpatient monitoring.  Overall, stable. Patient emphasized importance of stop alcohol consumption. He has been told that he will need to call [facility] to schedule appointment. Pathology report of the polyps removed will be followed up by gastroenterology. He's been also informed about his hepatitis C test result. He will need outpatient follow-up for same.   PERTINENT LABS:  The results of significant diagnostics from this hospitalization (including imaging, microbiology, ancillary and laboratory) are listed below for reference.     Labs: Basic Metabolic Panel:  Recent Labs Lab 02/15/16 1607 02/16/16 0657 02/17/16 0625 02/18/16 0519  NA 136 136 137 137  K 3.9 4.0 4.2 3.4*  CL 106 108 106 106  CO2 22 22 22 27   GLUCOSE 138* 96 87 102*  BUN <5* <5* 7 6  CREATININE 0.82 0.81 0.84 0.89  CALCIUM 8.2* 7.8* 8.0* 7.8*   Liver Function Tests:  Recent Labs Lab 02/15/16 1607 02/16/16 0657 02/17/16 0625 02/18/16 0519  AST 76* 76* 83* 72*  ALT 36 33 31 30  ALKPHOS 92 75 78 85  BILITOT 2.3* 2.7* 3.3* 2.0*  PROT 7.2 6.7 6.6 6.1*  ALBUMIN 2.2* 2.0* 2.0* 1.8*    Recent Labs Lab 02/15/16 1607  LIPASE 57*   CBC:  Recent Labs Lab 02/16/16 0416 02/16/16 0657 02/16/16 0912 02/17/16 0625 02/18/16 0519  WBC 5.0 4.4 4.1 3.7* 3.7*  HGB 10.4* 11.2* 10.8* 11.6* 10.2*  HCT 31.5* 33.6* 32.2* 34.5* 31.4*  MCV 78.8 79.4 78.5 79.3 80.1  PLT 52* 51* 50* 45* 46*    CBG:  Recent Labs Lab 02/17/16 0812 02/17/16 1659 02/17/16 2353 02/18/16 0403 02/18/16 0938  GLUCAP 99 108* 91 107* 97     IMAGING STUDIES Ct Abdomen Pelvis W Contrast  Result Date: 02/16/2016 CLINICAL DATA:  Abdominal pain, known hernia. EXAM: CT ABDOMEN AND  PELVIS WITH CONTRAST TECHNIQUE: Multidetector CT imaging of the abdomen and pelvis was performed using the standard protocol following bolus administration of intravenous contrast. CONTRAST:  138mL ISOVUE-300 IOPAMIDOL (ISOVUE-300) INJECTION 61% COMPARISON:  CT from 12/07/2014 FINDINGS: Lower chest: No acute abnormality. Hepatobiliary: Macronodular appearance of the hepatic contour with patent umbilical vein and moderate volume of ascites in the abdomen and pelvis consistent with cirrhosis. No space-occupying mass of the liver. No biliary dilatation. The gallbladder is distended and there is trace adjacent pericholecystic fluid likely related to ascites. Pancreas: Unremarkable Spleen: No splenomegaly. Splenic calcification consistent with a granuloma. Small hilar splenule. Adrenals/Urinary Tract: Normal bilateral adrenal glands, kidneys and bladder. Stomach/Bowel: Stomach is contracted. Small perigastric varices. There is normal small bowel rotation. Mild proximal small bowel distention with fluid and air. No mechanical obstruction. Findings may be related to a mild enteritis or potentially ileus. Large bowel is unremarkable. Vascular/Lymphatic: Aortoiliac atherosclerosis without aneurysm. Patent portal, splenic and superior mesenteric veins. Patent celiac axis, SMA and IMA. No lymphadenopathy. Reproductive: Normal size prostate. Other: No free air. Stable midline omental fat containing supraumbilical hernia. Musculoskeletal: L5 spondylolysis with grade 1 spondylolisthesis. Screws noted across the sacroiliac joints bilaterally. IMPRESSION: 1. Findings in keeping with cirrhosis with moderate volume ascites, perigastric varices, patent  umbilical vein but without space-occupying mass of the liver. 2. Mild distention of proximal small bowel loops possibly representing a mild enteritis or localized ileus. 3. Splenic granuloma. 4. L5 spondylolysis with grade 1 spondylolisthesis. Sacroiliac joint fixation with screws  bilaterally. 5. Small fat containing supraumbilical ventral hernia. Electronically Signed   By: Ashley Royalty M.D.   On: 02/16/2016 00:42   US Abdomen Limited  Result Date: 02/18/2016 CLINICAL DATA:  Abdominal discomfort. Request evaluation for ascites for possible paracentesis. EXAM: LIMITED ABDOMINAL ULTRASOUND COMPARISON:  None. FINDINGS: On limited ultrasound of all 4 quadrants reveals small volume of perihepatic ascites up over the dome of the liver. This is inaccessible for paracentesis. There is also trace to small volume of low pelvic ascites, insufficient for safe paracentesis. Procedure not performed. IMPRESSION: Trace to small volume of perihepatic and pelvic ascites. Read by: Ascencion Dike PA-C Electronically Signed   By: Jerilynn Mages.  Shick M.D.   On: 02/18/2016 08:52    DISCHARGE EXAMINATION: Vitals:   02/17/16 1250 02/17/16 1701 02/17/16 2354 02/18/16 0618  BP: (!) 155/109 135/90 140/83 (!) 146/89  Pulse: 61 76 62 71  Resp: 15 16 18 18   Temp:  98.1 F (36.7 C) 97.6 F (36.4 C) 97.4 F (36.3 C)  TempSrc:  Oral  Oral  SpO2: 97% 97% 94% 98%  Height:       General appearance: alert, cooperative, appears stated age and no distress Resp: clear to auscultation bilaterally Cardio: regular rate and rhythm, S1, S2 normal, no murmur, click, rub or gallop GI: soft, non-tender; bowel sounds normal; no masses,  no organomegaly Extremities: edema 1+  DISPOSITION: Home  Discharge Instructions    Call MD for:  difficulty breathing, headache or visual disturbances    Complete by:  As directed    Call MD for:  extreme fatigue    Complete by:  As directed    Call MD for:  persistant dizziness or light-headedness    Complete by:  As directed    Call MD for:  persistant nausea and vomiting    Complete by:  As directed    Call MD for:  severe uncontrolled pain    Complete by:  As directed    Call MD for:  temperature >100.4    Complete by:  As directed    Diet - low sodium heart healthy     Complete by:  As directed    Discharge instructions    Complete by:  As directed    Please stop consuming alcohol. Please be sure to follow-up with outpatient providers for further management of Liver disease and hepatitis C.  You were cared for by a hospitalist during your hospital stay. If you have any questions about your discharge medications or the care you received while you were in the hospital after you are discharged, you can call the unit and asked to speak with the hospitalist on call if the hospitalist that took care of you is not available. Once you are discharged, your primary care physician will handle any further medical issues. Please note that NO REFILLS for any discharge medications will be authorized once you are discharged, as it is imperative that you return to your primary care physician (or establish a relationship with a primary care physician if you do not have one) for your aftercare needs so that they can reassess your need for medications and monitor your lab values. If you do not have a primary care physician, you can call [phone] for  a physician referral.   Increase activity slowly    Complete by:  As directed       ALLERGIES: No Known Allergies    Discharge Medication List as of 02/18/2016 12:27 PM    START taking these medications   Details  ciprofloxacin (CIPRO) 500 MG tablet Take 1 tablet (500 mg total) by mouth 2 (two) times daily., Starting Fri 02/18/2016, Print    Multiple Vitamin (MULTIVITAMIN WITH MINERALS) TABS tablet Take 1 tablet by mouth daily., Starting Fri 02/18/2016, Print    oxyCODONE-acetaminophen (PERCOCET/ROXICET) 5-325 MG tablet Take 1 tablet by mouth every 8 (eight) hours as needed for severe pain., Starting Fri 02/18/2016, Print    spironolactone (ALDACTONE) 50 MG tablet Take 1 tablet (50 mg total) by mouth daily., Starting Fri 02/18/2016, Print    thiamine 100 MG tablet Take 1 tablet (100 mg total) by mouth daily., Starting Fri 02/18/2016,  Print      CONTINUE these medications which have NOT CHANGED   Details  hydroxypropyl methylcellulose / hypromellose (ISOPTO TEARS / GONIOVISC) 2.5 % ophthalmic solution Place 1 drop into both eyes 4 (four) times daily as needed for dry eyes., Historical Med      STOP taking these medications     ibuprofen (ADVIL,MOTRIN) 400 MG tablet      methocarbamol (ROBAXIN) 500 MG tablet          Follow-up Information    [place]. Call.   Why:  Call to schedule follow up appointment to establish a PCP. Contact information: [place] [phone]       Beryle Beams, MD. Schedule an appointment as soon as possible for a visit in 2 week(s).   Specialty:  Gastroenterology Why:  for liver disease and hepatitis c Contact information: [place], [place] [address] [phone]           TOTAL DISCHARGE TIME: 74 minutes  [facility] Pager [phone]  02/18/2016, 3:04 PM

## 2016-02-18 NOTE — Progress Notes (Signed)
[name] D/C'd  per MD order.  Discussed with the patient and all questions fully answered.  VSS, Skin clean, dry and intact without evidence of skin break down, no evidence of skin tears noted. IV catheter discontinued intact. Site without signs and symptoms of complications. Dressing and pressure applied.  An After Visit Summary was printed and given to the patient. Patient received prescription.  D/c education completed with patient/family including follow up instructions, medication list, d/c activities limitations if indicated, with other d/c instructions as indicated by MD - patient able to verbalize understanding, all questions fully answered.   Patient instructed to return to ED, call 911, or call MD for any changes in condition.   Patient escorted via [place], and D/C home via private auto.  [name] 02/18/2016 12:44 PM

## 2016-03-06 ENCOUNTER — Ambulatory Visit: Payer: Self-pay | Attending: Family Medicine | Admitting: Family Medicine

## 2016-03-06 ENCOUNTER — Encounter: Payer: Self-pay | Admitting: Family Medicine

## 2016-03-06 VITALS — BP 155/105 | HR 75 | Temp 97.8°F | Ht 71.0 in | Wt 216.6 lb

## 2016-03-06 DIAGNOSIS — F419 Anxiety disorder, unspecified: Secondary | ICD-10-CM | POA: Insufficient documentation

## 2016-03-06 DIAGNOSIS — M19041 Primary osteoarthritis, right hand: Secondary | ICD-10-CM | POA: Insufficient documentation

## 2016-03-06 DIAGNOSIS — R600 Localized edema: Secondary | ICD-10-CM | POA: Insufficient documentation

## 2016-03-06 DIAGNOSIS — R768 Other specified abnormal immunological findings in serum: Secondary | ICD-10-CM

## 2016-03-06 DIAGNOSIS — D696 Thrombocytopenia, unspecified: Secondary | ICD-10-CM | POA: Insufficient documentation

## 2016-03-06 DIAGNOSIS — M19042 Primary osteoarthritis, left hand: Secondary | ICD-10-CM | POA: Insufficient documentation

## 2016-03-06 DIAGNOSIS — B1920 Unspecified viral hepatitis C without hepatic coma: Secondary | ICD-10-CM | POA: Insufficient documentation

## 2016-03-06 DIAGNOSIS — G4709 Other insomnia: Secondary | ICD-10-CM

## 2016-03-06 DIAGNOSIS — K7031 Alcoholic cirrhosis of liver with ascites: Secondary | ICD-10-CM | POA: Insufficient documentation

## 2016-03-06 DIAGNOSIS — E119 Type 2 diabetes mellitus without complications: Secondary | ICD-10-CM | POA: Insufficient documentation

## 2016-03-06 DIAGNOSIS — I10 Essential (primary) hypertension: Secondary | ICD-10-CM | POA: Insufficient documentation

## 2016-03-06 DIAGNOSIS — G4700 Insomnia, unspecified: Secondary | ICD-10-CM | POA: Insufficient documentation

## 2016-03-06 MED ORDER — THIAMINE HCL 100 MG PO TABS: 100.0000 mg | ORAL_TABLET | Freq: Every day | ORAL | 3 refills | Status: DC

## 2016-03-06 MED ORDER — HYDROXYZINE HCL 10 MG PO TABS: 10.0000 mg | ORAL_TABLET | Freq: Three times a day (TID) | ORAL | 1 refills | Status: DC | PRN

## 2016-03-06 MED ORDER — SPIRONOLACTONE 50 MG PO TABS: 50.0000 mg | ORAL_TABLET | Freq: Every day | ORAL | 3 refills | Status: DC

## 2016-03-06 MED ORDER — FUROSEMIDE 40 MG PO TABS: 40.0000 mg | ORAL_TABLET | Freq: Every day | ORAL | 3 refills | Status: DC

## 2016-03-06 MED FILL — SPIRONOLACTONE 50 MG TABLET: 50 | 30 days supply | Qty: 30 | Fill #0

## 2016-03-06 MED FILL — ?FUROSEMIDE 40 MG TABLET: 40 | 30 days supply | Qty: 30 | Fill #0

## 2016-03-06 MED FILL — hydrOXYzine HCL 10 MG TABS: 10 | 30 days supply | Qty: 90 | Fill #0

## 2016-03-06 NOTE — Progress Notes (Signed)
Subjective:  Patient ID: [name], male    DOB: [birth year]  Age: 66 y.o. MRN: [id number]  CC: Hospitalization Follow-up; cirrhosis of liver; Hernia; and Hepatitis C   HPI [name] is a 66 year old male with a history of alcohol abuse: Was recently hospitalized at [facility] from 02/15/16 through 02/18/16 for lower GI bleed.  He had presented to the ED with abdominal pain and rectal bleed, Hemoglobin was 11.0; CT abdomen and pelvis was concerning for cirrhosis, moderate Ascites, ventral hernia and perigastric varices. Seen by GI and underwent EGD which revealed portal hypertensive gastropathy, colonoscopy revealed polyps which were resected and clip (MRI) unsafe was placed. Bleeding was thought to be hemorrhoidal. Labs revealed positive hepatitis C antibody, thrombocytopenia was thought to be secondary to alcoholism and cirrhosis. His condition improved with hemoglobin remaining stable and he was discharged on medications which he is yet to pick up.   He informs me he has quit alcohol consumption. Complains of insomnia and anxiety but denies suicidal ideation or intents. He also complains of edema of his legs associated with pain as well as abdominal pain. Denies lower GI bleed or hematemesis.  Past Medical History:  Diagnosis Date  . Abdominal hernia   . Arthritis    "hands" (02/16/2016)  . Bright red rectal bleeding    "@ least 1-2 times/month; sometimes more" (02/16/2016)  . Hypertension   . Type II diabetes mellitus ([place])     Past Surgical History:  Procedure Laterality Date  . COLONOSCOPY N/A 02/17/2016   Procedure: COLONOSCOPY;  Surgeon: [name], MD;  Location: [facility] ENDOSCOPY;  Service: Endoscopy;  Laterality: N/A;  . ESOPHAGOGASTRODUODENOSCOPY N/A 02/17/2016   Procedure: ESOPHAGOGASTRODUODENOSCOPY (EGD);  Surgeon: [name], MD;  Location: [facility] ENDOSCOPY;  Service: Endoscopy;  Laterality: N/A;  . FRACTURE SURGERY    . [place]   "has  steel rods in it"    No Known Allergies   Outpatient Medications Prior to Visit  Medication Sig Dispense Refill  . Multiple Vitamin (MULTIVITAMIN WITH MINERALS) TABS tablet Take 1 tablet by mouth daily. 30 tablet 0  . hydroxypropyl methylcellulose / hypromellose (ISOPTO TEARS / GONIOVISC) 2.5 % ophthalmic solution Place 1 drop into both eyes 4 (four) times daily as needed for dry eyes.    Marland Kitchen oxyCODONE-acetaminophen (PERCOCET/ROXICET) 5-325 MG tablet Take 1 tablet by mouth every 8 (eight) hours as needed for severe pain. (Patient not taking: Reported on 03/06/2016) 10 tablet 0  . ciprofloxacin (CIPRO) 500 MG tablet Take 1 tablet (500 mg total) by mouth 2 (two) times daily. 10 tablet 0  . spironolactone (ALDACTONE) 50 MG tablet Take 1 tablet (50 mg total) by mouth daily. (Patient not taking: Reported on 03/06/2016) 30 tablet 0  . thiamine 100 MG tablet Take 1 tablet (100 mg total) by mouth daily. (Patient not taking: Reported on 03/06/2016) 30 tablet 0   No facility-administered medications prior to visit.     ROS Review of Systems  Constitutional: Negative for activity change and appetite change.  HENT: Negative for sinus pressure and sore throat.   Eyes: Negative for visual disturbance.  Respiratory: Negative for cough, chest tightness and shortness of breath.   Cardiovascular: Positive for leg swelling. Negative for chest pain.  Gastrointestinal: Positive for abdominal distention. Negative for abdominal pain, constipation and diarrhea.  Endocrine: Negative.   Genitourinary: Negative for dysuria.  Musculoskeletal: Negative for joint swelling and myalgias.  Skin: Negative for rash.  Allergic/Immunologic: Negative.   Neurological: Negative  for weakness, light-headedness and numbness.  Psychiatric/Behavioral: Positive for sleep disturbance. Negative for dysphoric mood and suicidal ideas.       Positive for anxiety    Objective:  BP (!) 155/105 (BP Location: Right Arm, Patient Position:  Sitting, Cuff Size: Small)   Pulse 75   Temp 97.8 F (36.6 C) (Oral)   Ht 5\' 11"  (1.803 m)   Wt 216 lb 9.6 oz (98.2 kg)   SpO2 98%   BMI 30.21 kg/m   BP/Weight 03/06/2016 02/18/2016 [id number]  Systolic BP [id number] 123456 [id number]  Diastolic BP 123456 89 75  Wt. (Lbs) 216.6 - 215  BMI 30.21 - 29.99      Physical Exam  Constitutional: He is oriented to person, place, and time. He appears well-developed and well-nourished.  Cardiovascular: Normal rate, normal heart sounds and intact distal pulses.   No murmur heard. Pulmonary/Chest: Effort normal and breath sounds normal. He has no wheezes. He has no rales. He exhibits no tenderness.  Abdominal: Soft. Bowel sounds are normal. He exhibits distension (Mild ascites). He exhibits no mass. There is no tenderness.  Musculoskeletal: Normal range of motion. He exhibits edema (2+ bilateral pitting pedal edema).  Neurological: He is alert and oriented to person, place, and time.  Skin: Skin is warm and dry.  Psychiatric: He has a normal mood and affect.     Assessment & Plan:   1. Anxiety Commenced on hydroxyzine which will also help insomnia - hydrOXYzine (ATARAX/VISTARIL) 10 MG tablet; Take 1 tablet (10 mg total) by mouth 3 (three) times daily as needed.  Dispense: 90 tablet; Refill: 1  2. Other insomnia Discussed sleep hygiene  3. Alcoholic cirrhosis of liver with ascites (HCC) Currently with ascites and pedal edema He has quit alcohol Will need GI referral down the road for optimization of management Advised to work and applying for the [facility] discount to facilitate this referral - thiamine 100 MG tablet; Take 1 tablet (100 mg total) by mouth daily.  Dispense: 30 tablet; Refill: 3 - spironolactone (ALDACTONE) 50 MG tablet; Take 1 tablet (50 mg total) by mouth daily.  Dispense: 30 tablet; Refill: 3 - furosemide (LASIX) 40 MG tablet; Take 1 tablet (40 mg total) by mouth daily.  Dispense: 30 tablet; Refill: 3  4. Essential  hypertension Uncontrolled Lasix was recently added to her regimen and he remains on spironolactone We'll hold off and reassess blood pressure next visit to determine need for an antihypertensive  5. Hepatitis C antibody test positive May need referral to infectious disease for treatment for hep C   Meds ordered this encounter  Medications  . thiamine 100 MG tablet    Sig: Take 1 tablet (100 mg total) by mouth daily.    Dispense:  30 tablet    Refill:  3  . spironolactone (ALDACTONE) 50 MG tablet    Sig: Take 1 tablet (50 mg total) by mouth daily.    Dispense:  30 tablet    Refill:  3  . furosemide (LASIX) 40 MG tablet    Sig: Take 1 tablet (40 mg total) by mouth daily.    Dispense:  30 tablet    Refill:  3  . hydrOXYzine (ATARAX/VISTARIL) 10 MG tablet    Sig: Take 1 tablet (10 mg total) by mouth 3 (three) times daily as needed.    Dispense:  90 tablet    Refill:  1    Follow-up: Return in about 3 weeks (around 03/27/2016) for Follow-up: Liver cirrhosis.  [name] MD

## 2016-04-28 ENCOUNTER — Encounter (HOSPITAL_COMMUNITY): Payer: Self-pay | Admitting: Emergency Medicine

## 2016-04-28 ENCOUNTER — Emergency Department (HOSPITAL_COMMUNITY)
Admission: EM | Admit: 2016-04-28 | Discharge: 2016-04-28 | Disposition: A | Discharge status: Home / Self Care | Payer: Self-pay | Attending: Emergency Medicine | Admitting: Emergency Medicine

## 2016-04-28 DIAGNOSIS — K649 Unspecified hemorrhoids: Secondary | ICD-10-CM

## 2016-04-28 DIAGNOSIS — I10 Essential (primary) hypertension: Secondary | ICD-10-CM | POA: Insufficient documentation

## 2016-04-28 DIAGNOSIS — K625 Hemorrhage of anus and rectum: Secondary | ICD-10-CM

## 2016-04-28 DIAGNOSIS — K644 Residual hemorrhoidal skin tags: Secondary | ICD-10-CM | POA: Insufficient documentation

## 2016-04-28 DIAGNOSIS — Z87891 Personal history of nicotine dependence: Secondary | ICD-10-CM | POA: Insufficient documentation

## 2016-04-28 DIAGNOSIS — G8929 Other chronic pain: Secondary | ICD-10-CM | POA: Insufficient documentation

## 2016-04-28 DIAGNOSIS — E119 Type 2 diabetes mellitus without complications: Secondary | ICD-10-CM | POA: Insufficient documentation

## 2016-04-28 DIAGNOSIS — R109 Unspecified abdominal pain: Secondary | ICD-10-CM

## 2016-04-28 DIAGNOSIS — R1033 Periumbilical pain: Secondary | ICD-10-CM | POA: Insufficient documentation

## 2016-04-28 LAB — COMPREHENSIVE METABOLIC PANEL
ALBUMIN: 2.1 g/dL — AB (ref 3.5–5.0)
ALK PHOS: 112 U/L (ref 38–126)
ALT: 40 U/L (ref 17–63)
AST: 103 U/L — AB (ref 15–41)
Anion gap: 7 (ref 5–15)
CALCIUM: 7.9 mg/dL — AB (ref 8.9–10.3)
CHLORIDE: 106 mmol/L (ref 101–111)
CO2: 23 mmol/L (ref 22–32)
CREATININE: 0.8 mg/dL (ref 0.61–1.24)
GFR calc Af Amer: >60 mL/min (ref >60)
GFR calc non Af Amer: >60 mL/min (ref >60)
GLUCOSE: 138 mg/dL — AB (ref 65–99)
Potassium: 3.8 mmol/L (ref 3.5–5.1)
Sodium: 136 mmol/L (ref 135–145)
Total Bilirubin: 2.4 mg/dL — ABNORMAL HIGH (ref 0.3–1.2)
Total Protein: 6.4 g/dL — ABNORMAL LOW (ref 6.5–8.1)

## 2016-04-28 LAB — CBC WITH DIFFERENTIAL/PLATELET
Basophils Absolute: 0 10*3/uL (ref 0.0–0.1)
Basophils Relative: 1 %
Eosinophils Absolute: 0 10*3/uL (ref 0.0–0.7)
Eosinophils Relative: 0 %
HCT: 28.5 % — ABNORMAL LOW (ref 39.0–52.0)
Hemoglobin: 9.3 g/dL — ABNORMAL LOW (ref 13.0–17.0)
Lymphocytes Relative: 24 %
Lymphs Abs: 0.8 10*3/uL (ref 0.7–4.0)
MCH: 26.1 pg (ref 26.0–34.0)
MCHC: 32.6 g/dL (ref 30.0–36.0)
MCV: 79.8 fL (ref 78.0–100.0)
Monocytes Absolute: 0.3 10*3/uL (ref 0.1–1.0)
Monocytes Relative: 9 %
Neutro Abs: 2.1 10*3/uL (ref 1.7–7.7)
Neutrophils Relative %: 66 %
Platelets: 49 10*3/uL — ABNORMAL LOW (ref 150–400)
RBC: 3.57 MIL/uL — ABNORMAL LOW (ref 4.22–5.81)
RDW: 21.5 % — ABNORMAL HIGH (ref 11.5–15.5)
WBC: 3.2 10*3/uL — ABNORMAL LOW (ref 4.0–10.5)

## 2016-04-28 LAB — LIPASE, BLOOD: Lipase: 25 U/L (ref 11–51)

## 2016-04-28 MED ORDER — DICYCLOMINE HCL 10 MG PO CAPS: 10.0000 mg | ORAL_CAPSULE | Freq: Three times a day (TID) | ORAL | 0 refills | Status: DC | PRN

## 2016-04-28 MED ORDER — OXYCODONE HCL 5 MG PO TABS
5.0000 mg | ORAL_TABLET | Freq: Once | ORAL | Status: AC
  Administered 2016-04-28: 5 mg via ORAL
  Filled 2016-04-28: qty 1

## 2016-04-28 MED ORDER — DICYCLOMINE HCL 10 MG/ML IM SOLN
20.0000 mg | Freq: Once | INTRAMUSCULAR | Status: AC
  Administered 2016-04-28: 20 mg via INTRAMUSCULAR
  Filled 2016-04-28: qty 2

## 2016-04-28 NOTE — ED Notes (Signed)
Pt is in stable condition upon d/c and ambulates from ED. 

## 2016-04-28 NOTE — ED Provider Notes (Signed)
[place] DEPT Provider Note   CSN: [id number] Arrival date & time: 04/28/16  0931     History   Chief Complaint Chief Complaint  Patient presents with  . Rectal Bleeding    HPI [name] is a 66 y.o. male.  HPI  Went to visit mother in [place].  Reports bleeding from rectum, and was going to be admitted to [facility] but needed to leave.  Bleeding from rectum for about one year off and on.  Bleeding started on Monday or Tuesday, dark red blood from rectum for the last 5 days.  Has it when wiping and when having BM.  No black tarry stool. Similar bleeding to what he had coming into the hospital. Rectal pain with bowel movements.  Abdominal pain is chronic, reports has been going on for years, has hx hernia, is periumbilical.  No vomiting nor nausea. No fevers.  Hgb yesterday 10.6, CT with colitis.     Past Medical History:  Diagnosis Date  . Abdominal hernia   . Arthritis    "hands" (02/16/2016)  . Bright red rectal bleeding    "@ least 1-2 times/month; sometimes more" (02/16/2016)  . Hypertension   . Type II diabetes mellitus [facility])     Patient Active Problem List   Diagnosis Date Noted  . Anxiety 03/06/2016  . Insomnia 03/06/2016  . Hypertension 03/06/2016  . Hepatitis C antibody test positive 02/18/2016  . Thrombocytopenia ([place]) 02/16/2016  . Normochromic normocytic anemia 02/16/2016  . Alcohol abuse 02/16/2016  . Cirrhosis of liver ([place]) 02/16/2016    Past Surgical History:  Procedure Laterality Date  . COLONOSCOPY N/A 02/17/2016   Procedure: COLONOSCOPY;  Surgeon: [name], MD;  Location: [facility] ENDOSCOPY;  Service: Endoscopy;  Laterality: N/A;  . ESOPHAGOGASTRODUODENOSCOPY N/A 02/17/2016   Procedure: ESOPHAGOGASTRODUODENOSCOPY (EGD);  Surgeon: [name], MD;  Location: [facility] ENDOSCOPY;  Service: Endoscopy;  Laterality: N/A;  . FRACTURE SURGERY    . [place]   "has steel rods in it"       Home Medications    Prior to  Admission medications   Medication Sig Start Date End Date Taking? Authorizing Provider  dicyclomine (BENTYL) 10 MG capsule Take 1 capsule (10 mg total) by mouth 3 (three) times daily as needed for spasms (abdominal pain). 04/28/16   Gareth Morgan, MD  furosemide (LASIX) 40 MG tablet Take 1 tablet (40 mg total) by mouth daily. 03/06/16   [name], MD  hydroxypropyl methylcellulose / hypromellose (ISOPTO TEARS / GONIOVISC) 2.5 % ophthalmic solution Place 1 drop into both eyes 4 (four) times daily as needed for dry eyes.    Historical Provider, MD  hydrOXYzine (ATARAX/VISTARIL) 10 MG tablet Take 1 tablet (10 mg total) by mouth 3 (three) times daily as needed. 03/06/16   [name], MD  Multiple Vitamin (MULTIVITAMIN WITH MINERALS) TABS tablet Take 1 tablet by mouth daily. 02/18/16   [name], MD  oxyCODONE-acetaminophen (PERCOCET/ROXICET) 5-325 MG tablet Take 1 tablet by mouth every 8 (eight) hours as needed for severe pain. Patient not taking: Reported on 03/06/2016 02/18/16   [name], MD  spironolactone (ALDACTONE) 50 MG tablet Take 1 tablet (50 mg total) by mouth daily. 03/06/16   [name], MD  thiamine 100 MG tablet Take 1 tablet (100 mg total) by mouth daily. 03/06/16   [name], MD    Family History Family History  Problem Relation Age of Onset  . Hypertension Other     Social History Social History  Substance Use Topics  . Smoking status: Former Smoker    Packs/day: 0.50    Years: 38.00    Types: Cigarettes    Quit date: 2009  . Smokeless tobacco: Never Used  . Alcohol use 25.2 oz/week    42 Cans of beer per week     Comment: quit two weeks ago     Allergies   Patient has no known allergies.   Review of Systems Review of Systems  Constitutional: Negative for fever.  HENT: Negative for sore throat.   Eyes: Negative for visual disturbance.  Respiratory: Negative for shortness of breath.   Cardiovascular: Negative for chest pain.  Gastrointestinal:  Positive for abdominal pain, anal bleeding, blood in stool and diarrhea (once-twice a day for about 2 weeks). Negative for constipation, nausea and vomiting.  Genitourinary: Negative for difficulty urinating.  Musculoskeletal: Negative for back pain and neck stiffness.  Skin: Negative for rash.  Neurological: Negative for syncope and headaches.     Physical Exam Updated Vital Signs BP (!) 161/92   Pulse 81   Temp 97.9 F (36.6 C) (Oral)   Resp 16   SpO2 100%   Physical Exam  Constitutional: He is oriented to person, place, and time. He appears well-developed and well-nourished. No distress.  HENT:  Head: Normocephalic and atraumatic.  Eyes: Conjunctivae and EOM are normal.  Neck: Normal range of motion.  Cardiovascular: Normal rate, regular rhythm, normal heart sounds and intact distal pulses.  Exam reveals no gallop and no friction rub.   No murmur heard. Pulmonary/Chest: Effort normal and breath sounds normal. No respiratory distress. He has no wheezes. He has no rales.  Abdominal: Soft. He exhibits no distension. There is tenderness (mild periumbilical, hernia currently reduced). There is no guarding.  Genitourinary: Rectal exam shows external hemorrhoid.  Genitourinary Comments: No significant stool on rectal exam, no melena, no hematochezia, no gross blood  Musculoskeletal: He exhibits no edema.  Neurological: He is alert and oriented to person, place, and time.  Skin: Skin is warm and dry. He is not diaphoretic.  Nursing note and vitals reviewed.    ED Treatments / Results  Labs (all labs ordered are listed, but only abnormal results are displayed) Labs Reviewed  CBC WITH DIFFERENTIAL/PLATELET - Abnormal; Notable for the following:       Result Value   WBC 3.2 (*)    RBC 3.57 (*)    Hemoglobin 9.3 (*)    HCT 28.5 (*)    RDW 21.5 (*)    Platelets 49 (*)    All other components within normal limits  COMPREHENSIVE METABOLIC PANEL - Abnormal; Notable for the  following:    Glucose, Bld 138 (*)    BUN <5 (*)    Calcium 7.9 (*)    Total Protein 6.4 (*)    Albumin 2.1 (*)    AST 103 (*)    Total Bilirubin 2.4 (*)    All other components within normal limits  LIPASE, BLOOD    EKG  EKG Interpretation None       Radiology No results found.  Procedures Procedures (including critical care time)  Medications Ordered in ED Medications  oxyCODONE (Oxy IR/ROXICODONE) immediate release tablet 5 mg (5 mg Oral Given 04/28/16 1041)  dicyclomine (BENTYL) injection 20 mg (20 mg Intramuscular Given 04/28/16 1355)     Initial Impression / Assessment and Plan / ED Course  I have reviewed the triage vital signs and the nursing notes.  Pertinent labs &  imaging results that were available during my care of the patient were reviewed by me and considered in my medical decision making (see chart for details).    66 year old male with a history of alcohol abuse, cirrhosis, ascites, hernia, hepatitis C, diabetes, hypertension, admission in January with abdominal pain and rectal bleeding, found to have colonic and rectal polyps with bleeding thought to be hemorrhoidal, who was seen at [facility] yesterday 4/5 with CT showing cirrhosis, diffuse cecal, ascending and transverse colonic intramural wall thickening, infectious versus inflammatory bowel disease.  Patient hemodynamically stable in ED. Rectal exam without hematochezia nor melena, with signs of hemorrhoids. Given similarity to prior bleeding thought to be from hemorrhoids, duration of symptoms with hgb at baseline, normal vital signs, have low suspicion for clinically significant bleed at this time and feel most likely source of rectal bleeding with BM and pain is hemorrhoidal as previously diagnosed.  Abdominal pain is chronic by history, and may also be secondary to colitis seen on CT yesterday. Do not see indication for admission at this time. Recommend follow up with PCP and GI.  Gave rx for bentyl.  Patient discharged in stable condition with understanding of reasons to return.    Final Clinical Impressions(s) / ED Diagnoses   Final diagnoses:  Chronic abdominal pain  Hemorrhoids, unspecified hemorrhoid type  Rectal bleeding    New Prescriptions Discharge Medication List as of 04/28/2016  1:53 PM    START taking these medications   Details  dicyclomine (BENTYL) 10 MG capsule Take 1 capsule (10 mg total) by mouth 3 (three) times daily as needed for spasms (abdominal pain)., Starting Fri 04/28/2016, Print         Gareth Morgan, MD 04/28/16 [id number]

## 2016-04-28 NOTE — ED Notes (Signed)
CD containing CT scan from yesterday given to Dr. Billy Fischer.

## 2016-04-28 NOTE — ED Triage Notes (Signed)
Pt sts intermittent rectal bleeding x 1 year; pt sts some abd and rectal pain; pt sts seen in [place] yesterday for same

## 2016-07-01 ENCOUNTER — Encounter (HOSPITAL_COMMUNITY): Payer: Self-pay | Admitting: Emergency Medicine

## 2016-07-01 DIAGNOSIS — Z79899 Other long term (current) drug therapy: Secondary | ICD-10-CM | POA: Diagnosis not present

## 2016-07-01 DIAGNOSIS — Z87891 Personal history of nicotine dependence: Secondary | ICD-10-CM | POA: Insufficient documentation

## 2016-07-01 DIAGNOSIS — E119 Type 2 diabetes mellitus without complications: Secondary | ICD-10-CM | POA: Diagnosis not present

## 2016-07-01 DIAGNOSIS — R2243 Localized swelling, mass and lump, lower limb, bilateral: Secondary | ICD-10-CM | POA: Insufficient documentation

## 2016-07-01 DIAGNOSIS — M79606 Pain in leg, unspecified: Secondary | ICD-10-CM | POA: Diagnosis present

## 2016-07-01 DIAGNOSIS — I10 Essential (primary) hypertension: Secondary | ICD-10-CM | POA: Insufficient documentation

## 2016-07-01 NOTE — ED Triage Notes (Signed)
Pt c/o bilateral leg swollen and pain when walking, pt rescently started on Lasix states he is taking his medications as prescribed, denies CP, SOB.

## 2016-07-02 ENCOUNTER — Emergency Department (HOSPITAL_COMMUNITY)
Admission: EM | Admit: 2016-07-02 | Discharge: 2016-07-02 | Disposition: A | Discharge status: Home / Self Care | Payer: Medicaid Other | Attending: Physician Assistant | Admitting: Physician Assistant

## 2016-07-02 ENCOUNTER — Emergency Department (HOSPITAL_COMMUNITY): Payer: Medicaid Other

## 2016-07-02 DIAGNOSIS — M7989 Other specified soft tissue disorders: Secondary | ICD-10-CM

## 2016-07-02 LAB — CBC WITH DIFFERENTIAL/PLATELET
BASOS ABS: 0 10*3/uL (ref 0.0–0.1)
Basophils Relative: 1 %
Eosinophils Absolute: 0.2 10*3/uL (ref 0.0–0.7)
Eosinophils Relative: 5 %
HEMATOCRIT: 29.7 % — AB (ref 39.0–52.0)
Hemoglobin: 9.5 g/dL — ABNORMAL LOW (ref 13.0–17.0)
LYMPHS ABS: 2 10*3/uL (ref 0.7–4.0)
Lymphocytes Relative: 44 %
MCH: 24.8 pg — ABNORMAL LOW (ref 26.0–34.0)
MCHC: 32 g/dL (ref 30.0–36.0)
MCV: 77.5 fL — ABNORMAL LOW (ref 78.0–100.0)
MONO ABS: 0.5 10*3/uL (ref 0.1–1.0)
MONOS PCT: 12 %
NEUTROS PCT: 38 %
Neutro Abs: 1.7 10*3/uL (ref 1.7–7.7)
PLATELETS: 58 10*3/uL — AB (ref 150–400)
RBC: 3.83 MIL/uL — AB (ref 4.22–5.81)
RDW: 22.6 % — AB (ref 11.5–15.5)
WBC: 4.4 10*3/uL (ref 4.0–10.5)

## 2016-07-02 LAB — BASIC METABOLIC PANEL
Anion gap: 5 (ref 5–15)
BUN: 7 mg/dL (ref 6–20)
CALCIUM: 7.9 mg/dL — AB (ref 8.9–10.3)
CO2: 23 mmol/L (ref 22–32)
Chloride: 108 mmol/L (ref 101–111)
Creatinine, Ser: 0.96 mg/dL (ref 0.61–1.24)
Glucose, Bld: 106 mg/dL — ABNORMAL HIGH (ref 65–99)
Potassium: 3.6 mmol/L (ref 3.5–5.1)
Sodium: 136 mmol/L (ref 135–145)

## 2016-07-02 LAB — BRAIN NATRIURETIC PEPTIDE: B Natriuretic Peptide: 104.8 pg/mL — ABNORMAL HIGH (ref 0.0–100.0)

## 2016-07-02 MED ORDER — OXYCODONE-ACETAMINOPHEN 5-325 MG PO TABS
1.0000 | ORAL_TABLET | Freq: Once | ORAL | Status: AC
  Administered 2016-07-02: 1 via ORAL
  Filled 2016-07-02: qty 1

## 2016-07-02 MED ORDER — FUROSEMIDE 10 MG/ML IJ SOLN
40.0000 mg | INTRAMUSCULAR | Status: AC
  Administered 2016-07-02: 40 mg via INTRAVENOUS
  Filled 2016-07-02: qty 4

## 2016-07-02 NOTE — Discharge Instructions (Signed)
Continue taking your home medications and Lasix as prescribed. I recommend continuing to elevate your legs at home to help with your swelling. Call the primary care provider's office listed below to schedule a follow-up appointment for next week for follow up evaluation and further management of your leg swelling. Please return to the Emergency Department if symptoms worsen or new onset of fever, headache, dizziness, chest pain, difficulty breathing, abdominal pain, vomiting, numbness, weakness, redness, warmth.

## 2016-07-02 NOTE — ED Provider Notes (Signed)
[place] DEPT Provider Note   CSN: [id number] Arrival date & time: 07/01/16  1958     History   Chief Complaint Chief Complaint  Patient presents with  . Leg Pain    swollen    HPI [name] is a 66 y.o. male.  HPI   Patient is a 66 year old male with history of hypertension, diabetes and arthritis who presents the ED with complaint of worsening leg swelling. Patient reports over the past few months he has been having swelling to his legs. He states he was recently admitted to hospital in [place] last week and was discharged home on Tuesday (06/27/16) for his swelling. He states he has been taking his Lasix as prescribed but since he has been discharged home his swelling has continued to worsen. Endorses associated pain to his lower legs and generalized weakness. He also reports having a chronic mild intermittent nonproductive cough and tingling to his feet. Denies fever, chills, headache, dizziness, chest pain, shortness of breath, abdominal pain, vomiting, numbness, redness. Denies any recent fall, trauma or injury.  Past Medical History:  Diagnosis Date  . Abdominal hernia   . Arthritis    "hands" (02/16/2016)  . Bright red rectal bleeding    "@ least 1-2 times/month; sometimes more" (02/16/2016)  . Hypertension   . Type II diabetes mellitus [facility])     Patient Active Problem List   Diagnosis Date Noted  . Anxiety 03/06/2016  . Insomnia 03/06/2016  . Hypertension 03/06/2016  . Hepatitis C antibody test positive 02/18/2016  . Thrombocytopenia ([place]) 02/16/2016  . Normochromic normocytic anemia 02/16/2016  . Alcohol abuse 02/16/2016  . Cirrhosis of liver ([place]) 02/16/2016    Past Surgical History:  Procedure Laterality Date  . COLONOSCOPY N/A 02/17/2016   Procedure: COLONOSCOPY;  Surgeon: [name], MD;  Location[facility] ENDOSCOPY;  Service: Endoscopy;  Laterality: N/A;  . ESOPHAGOGASTRODUODENOSCOPY N/A 02/17/2016   Procedure: ESOPHAGOGASTRODUODENOSCOPY (EGD);   Surgeon: [name], MD;  Location: [facility] ENDOSCOPY;  Service: Endoscopy;  Laterality: N/A;  . FRACTURE SURGERY    . [place]   "has steel rods in it"       Home Medications    Prior to Admission medications   Medication Sig Start Date End Date Taking? Authorizing Provider  dicyclomine (BENTYL) 10 MG capsule Take 1 capsule (10 mg total) by mouth 3 (three) times daily as needed for spasms (abdominal pain). 04/28/16   Gareth Morgan, MD  furosemide (LASIX) 40 MG tablet Take 1 tablet (40 mg total) by mouth daily. 03/06/16   [name], MD  hydroxypropyl methylcellulose / hypromellose (ISOPTO TEARS / GONIOVISC) 2.5 % ophthalmic solution Place 1 drop into both eyes 4 (four) times daily as needed for dry eyes.    [provider]  hydrOXYzine (ATARAX/VISTARIL) 10 MG tablet Take 1 tablet (10 mg total) by mouth 3 (three) times daily as needed. 03/06/16   [name], MD  Multiple Vitamin (MULTIVITAMIN WITH MINERALS) TABS tablet Take 1 tablet by mouth daily. 02/18/16   [name], MD  oxyCODONE-acetaminophen (PERCOCET/ROXICET) 5-325 MG tablet Take 1 tablet by mouth every 8 (eight) hours as needed for severe pain. Patient not taking: Reported on 03/06/2016 02/18/16   [name], MD  spironolactone (ALDACTONE) 50 MG tablet Take 1 tablet (50 mg total) by mouth daily. 03/06/16   [name], MD  thiamine 100 MG tablet Take 1 tablet (100 mg total) by mouth daily. 03/06/16   [name], MD    Family History  Family History  Problem Relation Age of Onset  . Hypertension Other     Social History Social History  Substance Use Topics  . Smoking status: Former Smoker    Packs/day: 0.50    Years: 38.00    Types: Cigarettes    Quit date: 2009  . Smokeless tobacco: Never Used  . Alcohol use 25.2 oz/week    42 Cans of beer per week     Comment: quit two weeks ago     Allergies   Patient has no known allergies.   Review of Systems Review of Systems    Cardiovascular: Positive for leg swelling.  Musculoskeletal: Positive for myalgias (BLE).  Neurological: Positive for weakness (generalized).  All other systems reviewed and are negative.    Physical Exam Updated Vital Signs BP (!) 142/93   Pulse (!) 57   Temp 97.5 F (36.4 C) (Oral)   Resp 20   SpO2 100%   Physical Exam  Constitutional: He is oriented to person, place, and time. He appears well-developed and well-nourished. No distress.  HENT:  Head: Normocephalic and atraumatic.  Mouth/Throat: Oropharynx is clear and moist. No oropharyngeal exudate.  Eyes: Conjunctivae and EOM are normal. Right eye exhibits no discharge. Left eye exhibits no discharge. No scleral icterus.  Neck: Normal range of motion. Neck supple.  Cardiovascular: Normal rate, regular rhythm, normal heart sounds and intact distal pulses.   Pulmonary/Chest: Effort normal and breath sounds normal. No respiratory distress. He has no wheezes. He has no rales. He exhibits no tenderness.  Abdominal: Soft. Bowel sounds are normal. He exhibits no distension and no mass. There is no tenderness. There is no rebound and no guarding.  Musculoskeletal: Normal range of motion. He exhibits edema. He exhibits no tenderness or deformity.  2+ pitting edema noted to BLE extending up to proximal shin. Mild diffuse TTP of bilateral lower legs. FROM of BLE with 5/5 strength.   Neurological: He is alert and oriented to person, place, and time.  Skin: Skin is warm and dry. He is not diaphoretic.  Nursing note and vitals reviewed.    ED Treatments / Results  Labs (all labs ordered are listed, but only abnormal results are displayed) Labs Reviewed  CBC WITH DIFFERENTIAL/PLATELET - Abnormal; Notable for the following:       Result Value   RBC 3.83 (*)    Hemoglobin 9.5 (*)    HCT 29.7 (*)    MCV 77.5 (*)    MCH 24.8 (*)    RDW 22.6 (*)    Platelets 58 (*)    All other components within normal limits  BASIC METABOLIC PANEL -  Abnormal; Notable for the following:    Glucose, Bld 106 (*)    Calcium 7.9 (*)    All other components within normal limits  BRAIN NATRIURETIC PEPTIDE - Abnormal; Notable for the following:    B Natriuretic Peptide 104.8 (*)    All other components within normal limits    EKG  EKG Interpretation None       Radiology Dg Chest 2 View  Result Date: 07/02/2016 CLINICAL DATA:  Bilateral leg swelling. EXAM: CHEST  2 VIEW COMPARISON:  Chest from acute abdomen series 111416 FINDINGS: Normal heart size and mediastinal contours. Small right pleural effusion suspected. No pulmonary edema. No confluent airspace disease. No pneumothorax. Chronic widening of the acromioclavicular joints. IMPRESSION: Suspect small right pleural effusion. No evidence of congestive failure. Electronically Signed   By: Fonnie Birkenhead.D.  On: 07/02/2016 02:43    Procedures Procedures (including critical care time)  Medications Ordered in ED Medications  oxyCODONE-acetaminophen (PERCOCET/ROXICET) 5-325 MG per tablet 1 tablet (1 tablet Oral Given 07/02/16 0249)  furosemide (LASIX) injection 40 mg (40 mg Intravenous Given 07/02/16 0419)  oxyCODONE-acetaminophen (PERCOCET/ROXICET) 5-325 MG per tablet 1 tablet (1 tablet Oral Given 07/02/16 0506)     Initial Impression / Assessment and Plan / ED Course  I have reviewed the triage vital signs and the nursing notes.  Pertinent labs & imaging results that were available during my care of the patient were reviewed by me and considered in my medical decision making (see chart for details).     Patient presented with worsening chronic swelling to bilateral legs. Reports he has had swelling for the past few months but notes it worsened over the past week after being discharged home from the hospital. Patient states he has been compliant with his home prescription of Lasix. Denies fever, chest pain, shortness of breath. VSS. Exam revealed 2+ pitting edema present to  bilateral lower legs with mild diffuse tenderness. Bilateral lower extremities are vascularly intact. Remaining exam unremarkable. No erythema or excessive heat, no evidence of cellulitis, DVT, or septic joint. BNP 104. Remaining labs consistent with patient's baseline values. Chest x-ray showed small right pleural effusion, no evidence of congestive failure. Patient given single dose of 40 mg IV Lasix in the ED. Plan to discharge patient home with continued prescribed Lasix treatment and outpatient PCP follow-up.  Discussed results of plan for discharge with patient. Discussed return precautions.  Final Clinical Impressions(s) / ED Diagnoses   Final diagnoses:  Leg swelling    New Prescriptions Discharge Medication List as of 07/02/2016  4:35 AM       Nona Dell, PA-C 07/02/16 4037    Macarthur Critchley, MD 07/02/16 5436

## 2016-07-09 ENCOUNTER — Encounter (HOSPITAL_COMMUNITY): Payer: Self-pay | Admitting: Emergency Medicine

## 2016-07-09 ENCOUNTER — Emergency Department (HOSPITAL_COMMUNITY)
Admission: EM | Admit: 2016-07-09 | Discharge: 2016-07-09 | Disposition: A | Discharge status: Home / Self Care | Payer: Medicaid Other | Attending: Emergency Medicine | Admitting: Emergency Medicine

## 2016-07-09 ENCOUNTER — Emergency Department (HOSPITAL_COMMUNITY): Payer: Medicaid Other

## 2016-07-09 DIAGNOSIS — D696 Thrombocytopenia, unspecified: Secondary | ICD-10-CM | POA: Diagnosis not present

## 2016-07-09 DIAGNOSIS — Z87891 Personal history of nicotine dependence: Secondary | ICD-10-CM | POA: Diagnosis not present

## 2016-07-09 DIAGNOSIS — I10 Essential (primary) hypertension: Secondary | ICD-10-CM | POA: Insufficient documentation

## 2016-07-09 DIAGNOSIS — Z79899 Other long term (current) drug therapy: Secondary | ICD-10-CM | POA: Insufficient documentation

## 2016-07-09 DIAGNOSIS — K769 Liver disease, unspecified: Secondary | ICD-10-CM

## 2016-07-09 DIAGNOSIS — E119 Type 2 diabetes mellitus without complications: Secondary | ICD-10-CM | POA: Diagnosis not present

## 2016-07-09 DIAGNOSIS — E8809 Other disorders of plasma-protein metabolism, not elsewhere classified: Secondary | ICD-10-CM

## 2016-07-09 DIAGNOSIS — R600 Localized edema: Secondary | ICD-10-CM

## 2016-07-09 DIAGNOSIS — R770 Abnormality of albumin: Secondary | ICD-10-CM | POA: Insufficient documentation

## 2016-07-09 DIAGNOSIS — K7460 Unspecified cirrhosis of liver: Secondary | ICD-10-CM | POA: Diagnosis not present

## 2016-07-09 DIAGNOSIS — M7989 Other specified soft tissue disorders: Secondary | ICD-10-CM | POA: Diagnosis present

## 2016-07-09 LAB — COMPREHENSIVE METABOLIC PANEL
ALK PHOS: 90 U/L (ref 38–126)
ALT: 27 U/L (ref 17–63)
AST: 51 U/L — AB (ref 15–41)
Albumin: 1.8 g/dL — ABNORMAL LOW (ref 3.5–5.0)
Anion gap: 4 — ABNORMAL LOW (ref 5–15)
BILIRUBIN TOTAL: 1.8 mg/dL — AB (ref 0.3–1.2)
BUN: 10 mg/dL (ref 6–20)
CALCIUM: 7.8 mg/dL — AB (ref 8.9–10.3)
CHLORIDE: 109 mmol/L (ref 101–111)
CO2: 23 mmol/L (ref 22–32)
CREATININE: 1.01 mg/dL (ref 0.61–1.24)
Glucose, Bld: 102 mg/dL — ABNORMAL HIGH (ref 65–99)
Potassium: 3.7 mmol/L (ref 3.5–5.1)
Sodium: 136 mmol/L (ref 135–145)
TOTAL PROTEIN: 5.7 g/dL — AB (ref 6.5–8.1)

## 2016-07-09 LAB — CBC
HEMATOCRIT: 27.7 % — AB (ref 39.0–52.0)
Hemoglobin: 9 g/dL — ABNORMAL LOW (ref 13.0–17.0)
MCH: 25.4 pg — AB (ref 26.0–34.0)
MCHC: 32.5 g/dL (ref 30.0–36.0)
MCV: 78.2 fL (ref 78.0–100.0)
PLATELETS: 54 10*3/uL — AB (ref 150–400)
RBC: 3.54 MIL/uL — AB (ref 4.22–5.81)
RDW: 24.6 % — ABNORMAL HIGH (ref 11.5–15.5)
WBC: 4.1 10*3/uL (ref 4.0–10.5)

## 2016-07-09 MED ORDER — TRAMADOL HCL 50 MG PO TABS
50.0000 mg | ORAL_TABLET | Freq: Once | ORAL | Status: AC
  Administered 2016-07-09: 50 mg via ORAL
  Filled 2016-07-09: qty 1

## 2016-07-09 MED ORDER — FUROSEMIDE 20 MG PO TABS
40.0000 mg | ORAL_TABLET | Freq: Once | ORAL | Status: AC
  Administered 2016-07-09: 40 mg via ORAL
  Filled 2016-07-09: qty 2

## 2016-07-09 NOTE — ED Triage Notes (Signed)
Pt. Statyed, my legs have been swelling for over a month.  I also have a hernia thats bothering me.

## 2016-07-09 NOTE — Discharge Instructions (Signed)
It was our pleasure to provide your ER care today - we hope that you feel better.  From today's lab tests your platelet count and albumin level are very low.  Likely  the low albumin level, that is related to your liver disease, is responsible for the swelling.  Limit your salt intake. Elevate legs as much as possible. Continue medication.  Follow up with primary care doctor and GI doctor in the next 1-2 weeks - call office Monday to arrange appointment.   Return to ER if worse, new symptoms, trouble breathing, fevers, other concern.

## 2016-07-09 NOTE — ED Notes (Signed)
Patient transported to X-ray 

## 2016-07-09 NOTE — ED Provider Notes (Signed)
[place] DEPT Provider Note   CSN: [id number] Arrival date & time: 07/09/16  1335     History   Chief Complaint Chief Complaint  Patient presents with  . Leg Swelling  . Hernia    HPI [name] is a 66 y.o. male.  Patient c/o bilateral leg swelling for the past several months, worse in past couple weeks. Symmetric, bil legs, mod-severe,  extending up to hips. Hx cirrhosis. Denies any ongoing etoh use. Denies orthopnea or pnd.  No chest pain or discomfort. No sob. States compliant w home meds, lasix 20 mg a day. Urinating normally. Denies diet or fluid indiscretion.    The history is provided by the patient.    Past Medical History:  Diagnosis Date  . Abdominal hernia   . Arthritis    "hands" (02/16/2016)  . Bright red rectal bleeding    "@ least 1-2 times/month; sometimes more" (02/16/2016)  . Hypertension   . Type II diabetes mellitus [facility])     Patient Active Problem List   Diagnosis Date Noted  . Anxiety 03/06/2016  . Insomnia 03/06/2016  . Hypertension 03/06/2016  . Hepatitis C antibody test positive 02/18/2016  . Thrombocytopenia ([place]) 02/16/2016  . Normochromic normocytic anemia 02/16/2016  . Alcohol abuse 02/16/2016  . Cirrhosis of liver ([place]) 02/16/2016    Past Surgical History:  Procedure Laterality Date  . COLONOSCOPY N/A 02/17/2016   Procedure: COLONOSCOPY;  Surgeon: [name], MD;  Location: [facility] ENDOSCOPY;  Service: Endoscopy;  Laterality: N/A;  . ESOPHAGOGASTRODUODENOSCOPY N/A 02/17/2016   Procedure: ESOPHAGOGASTRODUODENOSCOPY (EGD);  Surgeon: [name], MD;  Location[facility] ENDOSCOPY;  Service: Endoscopy;  Laterality: N/A;  . FRACTURE SURGERY    . [place]   "has steel rods in it"       Home Medications    Prior to Admission medications   Medication Sig Start Date End Date Taking? Authorizing Provider  dicyclomine (BENTYL) 10 MG capsule Take 1 capsule (10 mg total) by mouth 3 (three) times daily as needed for  spasms (abdominal pain). 04/28/16   Gareth Morgan, MD  furosemide (LASIX) 40 MG tablet Take 1 tablet (40 mg total) by mouth daily. 03/06/16   [name], MD  hydroxypropyl methylcellulose / hypromellose (ISOPTO TEARS / GONIOVISC) 2.5 % ophthalmic solution Place 1 drop into both eyes 4 (four) times daily as needed for dry eyes.    [provider]  hydrOXYzine (ATARAX/VISTARIL) 10 MG tablet Take 1 tablet (10 mg total) by mouth 3 (three) times daily as needed. 03/06/16   [name], MD  Multiple Vitamin (MULTIVITAMIN WITH MINERALS) TABS tablet Take 1 tablet by mouth daily. 02/18/16   [name], MD  oxyCODONE-acetaminophen (PERCOCET/ROXICET) 5-325 MG tablet Take 1 tablet by mouth every 8 (eight) hours as needed for severe pain. Patient not taking: Reported on 03/06/2016 02/18/16   [name], MD  spironolactone (ALDACTONE) 50 MG tablet Take 1 tablet (50 mg total) by mouth daily. 03/06/16   [name], MD  thiamine 100 MG tablet Take 1 tablet (100 mg total) by mouth daily. 03/06/16   [name], MD    Family History Family History  Problem Relation Age of Onset  . Hypertension Other     Social History Social History  Substance Use Topics  . Smoking status: Former Smoker    Packs/day: 0.50    Years: 38.00    Types: Cigarettes    Quit date: 2009  . Smokeless tobacco: Never Used  . Alcohol use  25.2 oz/week    42 Cans of beer per week     Comment: quit two weeks ago     Allergies   Patient has no known allergies.   Review of Systems Review of Systems  Constitutional: Negative for chills and fever.  HENT: Negative for sore throat.   Eyes: Negative for redness.  Respiratory: Negative for shortness of breath.   Cardiovascular: Negative for chest pain.  Gastrointestinal: Negative for abdominal pain, diarrhea and vomiting.  Genitourinary: Negative for flank pain.  Musculoskeletal: Negative for back pain.  Skin: Negative for rash.  Neurological: Negative for  headaches.  Hematological: Does not bruise/bleed easily.  Psychiatric/Behavioral: Negative for confusion.     Physical Exam Updated Vital Signs BP 117/72   Pulse (!) 51   Temp 97.7 F (36.5 C) (Oral)   Resp 16   Ht 1.803 m (5\' 11" )   Wt 106.6 kg (235 lb)   SpO2 100%   BMI 32.78 kg/m   Physical Exam  Constitutional: He appears well-developed and well-nourished. No distress.  HENT:  Mouth/Throat: Oropharynx is clear and moist.  Eyes: Conjunctivae are normal.  Neck: Neck supple. No tracheal deviation present.  Cardiovascular: Normal rate, regular rhythm, normal heart sounds and intact distal pulses.  Exam reveals no gallop and no friction rub.   No murmur heard. Pulmonary/Chest: Effort normal and breath sounds normal. No accessory muscle usage. No respiratory distress.  Abdominal: Soft. Bowel sounds are normal. He exhibits no distension. There is no tenderness.  Soft non tender, reduced periumbilical hernia.   Genitourinary:  Genitourinary Comments: No cva tenderness  Musculoskeletal: He exhibits edema.  Mod-severe edema extending to prox thigh bil.  No calf pain or tenderness.   Neurological: He is alert.  Skin: Skin is warm and dry. No rash noted. He is not diaphoretic.  Psychiatric: He has a normal mood and affect.  Nursing note and vitals reviewed.    ED Treatments / Results  Labs (all labs ordered are listed, but only abnormal results are displayed) Results for orders placed or performed during the hospital encounter of 07/09/16  CBC  Result Value Ref Range   WBC 4.1 4.0 - 10.5 K/uL   RBC 3.54 (L) 4.22 - 5.81 MIL/uL   Hemoglobin 9.0 (L) 13.0 - 17.0 g/dL   HCT 27.7 (L) 39.0 - 52.0 %   MCV 78.2 78.0 - 100.0 fL   MCH 25.4 (L) 26.0 - 34.0 pg   MCHC 32.5 30.0 - 36.0 g/dL   RDW 24.6 (H) 11.5 - 15.5 %   Platelets 54 (L) 150 - 400 K/uL  Comprehensive metabolic panel  Result Value Ref Range   Sodium 136 135 - 145 mmol/L   Potassium 3.7 3.5 - 5.1 mmol/L   Chloride  109 101 - 111 mmol/L   CO2 23 22 - 32 mmol/L   Glucose, Bld 102 (H) 65 - 99 mg/dL   BUN 10 6 - 20 mg/dL   Creatinine, Ser 1.01 0.61 - 1.24 mg/dL   Calcium 7.8 (L) 8.9 - 10.3 mg/dL   Total Protein 5.7 (L) 6.5 - 8.1 g/dL   Albumin 1.8 (L) 3.5 - 5.0 g/dL   AST 51 (H) 15 - 41 U/L   ALT 27 17 - 63 U/L   Alkaline Phosphatase 90 38 - 126 U/L   Total Bilirubin 1.8 (H) 0.3 - 1.2 mg/dL   GFR calc non Af Amer >60 >60 mL/min   GFR calc Af Amer >60 >60 mL/min   Anion  gap 4 (L) 5 - 15   Dg Chest 2 View  Result Date: 07/09/2016 CLINICAL DATA:  Bilateral leg swelling and abdominal swelling. EXAM: CHEST  2 VIEW COMPARISON:  07/02/2016 FINDINGS: The cardiac silhouette is normal. Prominence of the right mediastinal contour. There is no evidence of focal airspace consolidation, pleural effusion or pneumothorax. Osseous structures are without acute abnormality. Soft tissues are grossly normal. IMPRESSION: Prominence of the right mediastinal contour. This may represent overlapping vascular structures, or ectatic thoracic aorta, however vascular abnormality cannot be excluded. Further evaluation with CTA of the chest may be considered. No evidence of pulmonary edema or airspace consolidation. Electronically Signed   By: Fidela Salisbury M.D.   On: 07/09/2016 17:28   D EKG  EKG Interpretation None       Radiology No results found.  Procedures Procedures (including critical care time)  Medications Ordered in ED Medications  traMADol (ULTRAM) tablet 50 mg (not administered)     Initial Impression / Assessment and Plan / ED Course  I have reviewed the triage vital signs and the nursing notes.  Pertinent labs & imaging results that were available during my care of the patient were reviewed by me and considered in my medical decision making (see chart for details).  Labs. Legs elevated.   Pt requests pain med. Ultram po.   Reviewed nursing notes and prior charts for additional history.    Lasix po.  Good uo.  Patients lab results appear c/w his baseline.   Albumin level is very low, and contributes to significant leg edema.   Discussed close pcp/gi f/u as outpt.   Return precautions provided.    Final Clinical Impressions(s) / ED Diagnoses   Final diagnoses:  None    New Prescriptions New Prescriptions   No medications on file     Lajean Saver, MD 07/09/16 1901

## 2016-07-09 NOTE — ED Notes (Signed)
Pt was able to ambulate from triage to Pod D.

## 2016-07-19 ENCOUNTER — Encounter (HOSPITAL_COMMUNITY): Payer: Self-pay

## 2016-07-19 DIAGNOSIS — Z87891 Personal history of nicotine dependence: Secondary | ICD-10-CM | POA: Insufficient documentation

## 2016-07-19 DIAGNOSIS — R1907 Generalized intra-abdominal and pelvic swelling, mass and lump: Secondary | ICD-10-CM | POA: Diagnosis present

## 2016-07-19 DIAGNOSIS — E119 Type 2 diabetes mellitus without complications: Secondary | ICD-10-CM | POA: Diagnosis not present

## 2016-07-19 DIAGNOSIS — R600 Localized edema: Secondary | ICD-10-CM | POA: Diagnosis not present

## 2016-07-19 DIAGNOSIS — Z79899 Other long term (current) drug therapy: Secondary | ICD-10-CM | POA: Insufficient documentation

## 2016-07-19 DIAGNOSIS — I10 Essential (primary) hypertension: Secondary | ICD-10-CM | POA: Insufficient documentation

## 2016-07-19 DIAGNOSIS — K7030 Alcoholic cirrhosis of liver without ascites: Secondary | ICD-10-CM | POA: Insufficient documentation

## 2016-07-19 LAB — COMPREHENSIVE METABOLIC PANEL
ALK PHOS: 90 U/L (ref 38–126)
ALT: 31 U/L (ref 17–63)
AST: 59 U/L — ABNORMAL HIGH (ref 15–41)
Albumin: 2.1 g/dL — ABNORMAL LOW (ref 3.5–5.0)
Anion gap: 5 (ref 5–15)
BILIRUBIN TOTAL: 2.4 mg/dL — AB (ref 0.3–1.2)
BUN: 8 mg/dL (ref 6–20)
CALCIUM: 7.9 mg/dL — AB (ref 8.9–10.3)
CO2: 25 mmol/L (ref 22–32)
CREATININE: 1.04 mg/dL (ref 0.61–1.24)
Chloride: 105 mmol/L (ref 101–111)
GFR calc Af Amer: >60 mL/min (ref >60)
Glucose, Bld: 127 mg/dL — ABNORMAL HIGH (ref 65–99)
POTASSIUM: 3.3 mmol/L — AB (ref 3.5–5.1)
Sodium: 135 mmol/L (ref 135–145)
TOTAL PROTEIN: 6.2 g/dL — AB (ref 6.5–8.1)

## 2016-07-19 LAB — URINALYSIS, ROUTINE W REFLEX MICROSCOPIC
BILIRUBIN URINE: NEGATIVE
Glucose, UA: NEGATIVE mg/dL
Hgb urine dipstick: NEGATIVE
KETONES UR: NEGATIVE mg/dL
LEUKOCYTES UA: NEGATIVE
NITRITE: NEGATIVE
PROTEIN: NEGATIVE mg/dL
Specific Gravity, Urine: 1.006 (ref 1.005–1.030)
pH: 5 (ref 5.0–8.0)

## 2016-07-19 LAB — LIPASE, BLOOD: Lipase: 66 U/L — ABNORMAL HIGH (ref 11–51)

## 2016-07-19 LAB — CBC
HEMATOCRIT: 30.8 % — AB (ref 39.0–52.0)
Hemoglobin: 10 g/dL — ABNORMAL LOW (ref 13.0–17.0)
MCH: 25.6 pg — ABNORMAL LOW (ref 26.0–34.0)
MCHC: 32.5 g/dL (ref 30.0–36.0)
MCV: 79 fL (ref 78.0–100.0)
PLATELETS: 62 10*3/uL — AB (ref 150–400)
RBC: 3.9 MIL/uL — ABNORMAL LOW (ref 4.22–5.81)
RDW: 24.9 % — AB (ref 11.5–15.5)
WBC: 5 10*3/uL (ref 4.0–10.5)

## 2016-07-19 NOTE — ED Triage Notes (Signed)
Pt states that he has abd pain and swelling in his belly and legs due to his cirrhosis. Was seen on Sunday and given lasix without relief.

## 2016-07-20 ENCOUNTER — Emergency Department (HOSPITAL_COMMUNITY)
Admission: EM | Admit: 2016-07-20 | Discharge: 2016-07-20 | Disposition: A | Discharge status: Home / Self Care | Payer: Medicaid Other | Attending: Emergency Medicine | Admitting: Emergency Medicine

## 2016-07-20 ENCOUNTER — Emergency Department (HOSPITAL_COMMUNITY): Payer: Medicaid Other

## 2016-07-20 DIAGNOSIS — R609 Edema, unspecified: Secondary | ICD-10-CM

## 2016-07-20 DIAGNOSIS — K7031 Alcoholic cirrhosis of liver with ascites: Secondary | ICD-10-CM

## 2016-07-20 HISTORY — DX: Unspecified cirrhosis of liver: K74.60

## 2016-07-20 LAB — TROPONIN I: Troponin I: <0.03 ng/mL (ref <0.03)

## 2016-07-20 LAB — BRAIN NATRIURETIC PEPTIDE: B NATRIURETIC PEPTIDE 5: 52.3 pg/mL (ref 0.0–100.0)

## 2016-07-20 MED ORDER — OXYCODONE HCL 5 MG PO TABS: 5.0000 mg | ORAL_TABLET | ORAL | 0 refills | Status: DC | PRN

## 2016-07-20 MED ORDER — FUROSEMIDE 20 MG PO TABS
40.0000 mg | ORAL_TABLET | Freq: Once | ORAL | Status: AC
  Administered 2016-07-20: 40 mg via ORAL
  Filled 2016-07-20: qty 2

## 2016-07-20 MED ORDER — OXYCODONE-ACETAMINOPHEN 5-325 MG PO TABS
1.0000 | ORAL_TABLET | Freq: Once | ORAL | Status: AC
  Administered 2016-07-20: 1 via ORAL
  Filled 2016-07-20: qty 1

## 2016-07-20 MED ORDER — FUROSEMIDE 20 MG PO TABS: 20.0000 mg | ORAL_TABLET | Freq: Every day | ORAL | 0 refills | Status: DC

## 2016-07-20 NOTE — ED Provider Notes (Signed)
[place] DEPT Provider Note   CSN: [id number] Arrival date & time: 07/19/16  2007     History   Chief Complaint Chief Complaint  Patient presents with  . Abdominal Pain    HPI [name] is a 66 y.o. male.  Patient with a history of alcoholic cirrhosis, HTN, hep C (antibody positive) presents with complaint of bilateral LE swelling that is increasing and painful. He reports he feels swollen up to abdomen. He denies scrotal swelling. No fever. He is SOB that is also worsening over time. SOB is worse with exertion and with lying flat. He reports he has to sleep in a recliner at this point. There is mild CP which he associates with pain in his legs.    The history is provided by the patient and the spouse. No language interpreter was used.  Abdominal Pain   Pertinent negatives include fever, nausea and vomiting.    Past Medical History:  Diagnosis Date  . Abdominal hernia   . Arthritis    "hands" (02/16/2016)  . Bright red rectal bleeding    "@ least 1-2 times/month; sometimes more" (02/16/2016)  . Cirrhosis ([name])   . Hypertension   . Type II diabetes mellitus [name])     Patient Active Problem List   Diagnosis Date Noted  . Anxiety 03/06/2016  . Insomnia 03/06/2016  . Hypertension 03/06/2016  . Hepatitis C antibody test positive 02/18/2016  . Thrombocytopenia ([place]) 02/16/2016  . Normochromic normocytic anemia 02/16/2016  . Alcohol abuse 02/16/2016  . Cirrhosis of liver ([name]) 02/16/2016    Past Surgical History:  Procedure Laterality Date  . COLONOSCOPY N/A 02/17/2016   Procedure: COLONOSCOPY;  Surgeon: [name], MD;  Location: [name] ENDOSCOPY;  Service: Endoscopy;  Laterality: N/A;  . ESOPHAGOGASTRODUODENOSCOPY N/A 02/17/2016   Procedure: ESOPHAGOGASTRODUODENOSCOPY (EGD);  Surgeon: [name], MD;  Location: [name] ENDOSCOPY;  Service: Endoscopy;  Laterality: N/A;  . FRACTURE SURGERY    . [name]   "has steel rods in it"       Home  Medications    Prior to Admission medications   Medication Sig Start Date End Date Taking? Authorizing Provider  dicyclomine (BENTYL) 10 MG capsule Take 1 capsule (10 mg total) by mouth 3 (three) times daily as needed for spasms (abdominal pain). 04/28/16   Gareth Morgan, MD  furosemide (LASIX) 40 MG tablet Take 1 tablet (40 mg total) by mouth daily. 03/06/16   [name], MD  hydroxypropyl methylcellulose / hypromellose (ISOPTO TEARS / GONIOVISC) 2.5 % ophthalmic solution Place 1 drop into both eyes 4 (four) times daily as needed for dry eyes.    [provider]  hydrOXYzine (ATARAX/VISTARIL) 10 MG tablet Take 1 tablet (10 mg total) by mouth 3 (three) times daily as needed. 03/06/16   [name], MD  Multiple Vitamin (MULTIVITAMIN WITH MINERALS) TABS tablet Take 1 tablet by mouth daily. 02/18/16   [name], MD  oxyCODONE-acetaminophen (PERCOCET/ROXICET) 5-325 MG tablet Take 1 tablet by mouth every 8 (eight) hours as needed for severe pain. Patient not taking: Reported on 03/06/2016 02/18/16   [name], MD  spironolactone (ALDACTONE) 50 MG tablet Take 1 tablet (50 mg total) by mouth daily. 03/06/16   [name], MD  thiamine 100 MG tablet Take 1 tablet (100 mg total) by mouth daily. 03/06/16   [name], MD    Family History Family History  Problem Relation Age of Onset  . Hypertension Other     Social History Social  History  Substance Use Topics  . Smoking status: Former Smoker    Packs/day: 0.50    Years: 38.00    Types: Cigarettes    Quit date: 2009  . Smokeless tobacco: Never Used  . Alcohol use 25.2 oz/week    42 Cans of beer per week     Comment: quit two weeks ago     Allergies   Patient has no known allergies.   Review of Systems Review of Systems  Constitutional: Negative for chills and fever.  HENT: Negative.   Respiratory: Positive for shortness of breath. Negative for cough.   Cardiovascular: Positive for chest pain and leg  swelling.  Gastrointestinal: Positive for abdominal pain. Negative for nausea and vomiting.  Genitourinary: Negative for scrotal swelling and testicular pain.  Musculoskeletal: Negative.   Skin: Negative.   Neurological: Negative.  Negative for weakness.     Physical Exam Updated Vital Signs BP (!) 157/99   Pulse 74   Temp 98.4 F (36.9 C) (Oral)   Resp 19   SpO2 98%   Physical Exam  Constitutional: He is oriented to person, place, and time. He appears well-developed and well-nourished.  HENT:  Head: Normocephalic.  Eyes: No scleral icterus.  Neck: Normal range of motion. Neck supple.  Cardiovascular: Normal rate and regular rhythm.   Pulmonary/Chest: Effort normal. He has rales (at bilateral bases).  Abdominal: Soft. He exhibits distension. There is tenderness (Diffusely tender). There is no rebound and no guarding.  Musculoskeletal: Normal range of motion. He exhibits edema (Significant pitting edema bilateral LE's).  Neurological: He is alert and oriented to person, place, and time.  Skin: Skin is warm and dry. No rash noted.  Psychiatric: He has a normal mood and affect.     ED Treatments / Results  Labs (all labs ordered are listed, but only abnormal results are displayed) Labs Reviewed  LIPASE, BLOOD - Abnormal; Notable for the following:       Result Value   Lipase 66 (*)    All other components within normal limits  COMPREHENSIVE METABOLIC PANEL - Abnormal; Notable for the following:    Potassium 3.3 (*)    Glucose, Bld 127 (*)    Calcium 7.9 (*)    Total Protein 6.2 (*)    Albumin 2.1 (*)    AST 59 (*)    Total Bilirubin 2.4 (*)    All other components within normal limits  CBC - Abnormal; Notable for the following:    RBC 3.90 (*)    Hemoglobin 10.0 (*)    HCT 30.8 (*)    MCH 25.6 (*)    RDW 24.9 (*)    Platelets 62 (*)    All other components within normal limits  URINALYSIS, ROUTINE W REFLEX MICROSCOPIC  BRAIN NATRIURETIC PEPTIDE  TROPONIN I     EKG  EKG Interpretation None       Radiology No results found.  Procedures Procedures (including critical care time)  Medications Ordered in ED Medications  oxyCODONE-acetaminophen (PERCOCET/ROXICET) 5-325 MG per tablet 1 tablet (not administered)     Initial Impression / Assessment and Plan / ED Course  I have reviewed the triage vital signs and the nursing notes.  Pertinent labs & imaging results that were available during my care of the patient were reviewed by me and considered in my medical decision making (see chart for details).     Patient returns to the emergency department with complaint of continued LE edema that he reports  as worse than before, SOB/orthopnea, chest/abdominal/LE discomfort. No fever.   Chart reviewed. He was seen and evaluated on 07/09/16 for same, discharged home on Lasix and PCP referral. The patient reports he does not have the financial means to see a doctor. He has run out of the Lasix Rx. He reports he quit drinking in May of this year.   No evidence of pulmonary edema on CXR. BNP 52.3. Troponin negative. All labs appear stable. Rise in total bilirubin from 1.8 (07/09/16) to 2.4 today. This will need close follow up.   Patient provided 40 mg Lasix in the ED. Will discharge home on 20 mg/day for the next week and strongly encourage PCP follow up. Will, again, provide referrals.   Final Clinical Impressions(s) / ED Diagnoses   Final diagnoses:  None   1. Peripheral edema 2. Liver cirrhosis   New Prescriptions New Prescriptions   No medications on file     Charlann Lange, Hershal Coria 23/[birth year]/35 [id number]    Delora Fuel, MD 68/37/29 [phone]

## 2016-07-20 NOTE — Discharge Instructions (Signed)
IT IS IMPORTANT TO FOLLOW UP IN THE OUTPATIENT SETTING FOR FURTHER MANAGEMENT OF LIVER DISEASE. YOUR LIVER FUNCTION TESTS ARE MORE ELEVATED TONIGHT AND WILL NEED TO BE RECHECKED.

## 2016-07-20 NOTE — ED Notes (Signed)
Patient transported to X-ray 

## 2016-08-04 ENCOUNTER — Ambulatory Visit (HOSPITAL_COMMUNITY)
Admission: EM | Admit: 2016-08-04 | Discharge: 2016-08-04 | Disposition: A | Discharge status: Nursing Home (Includes ICF) | Payer: Self-pay | Attending: Internal Medicine | Admitting: Internal Medicine

## 2016-08-04 ENCOUNTER — Encounter (HOSPITAL_COMMUNITY): Payer: Self-pay | Admitting: Emergency Medicine

## 2016-08-04 ENCOUNTER — Emergency Department (HOSPITAL_COMMUNITY): Payer: Medicaid Other

## 2016-08-04 ENCOUNTER — Encounter (HOSPITAL_COMMUNITY): Payer: Self-pay

## 2016-08-04 ENCOUNTER — Emergency Department (HOSPITAL_COMMUNITY)
Admission: EM | Admit: 2016-08-04 | Discharge: 2016-08-04 | Disposition: A | Discharge status: Home / Self Care | Payer: Medicaid Other | Attending: Emergency Medicine | Admitting: Emergency Medicine

## 2016-08-04 DIAGNOSIS — K7031 Alcoholic cirrhosis of liver with ascites: Secondary | ICD-10-CM

## 2016-08-04 DIAGNOSIS — R2240 Localized swelling, mass and lump, unspecified lower limb: Secondary | ICD-10-CM | POA: Diagnosis not present

## 2016-08-04 DIAGNOSIS — E119 Type 2 diabetes mellitus without complications: Secondary | ICD-10-CM | POA: Diagnosis not present

## 2016-08-04 DIAGNOSIS — Z87891 Personal history of nicotine dependence: Secondary | ICD-10-CM | POA: Diagnosis not present

## 2016-08-04 DIAGNOSIS — R0609 Other forms of dyspnea: Secondary | ICD-10-CM

## 2016-08-04 DIAGNOSIS — Z79899 Other long term (current) drug therapy: Secondary | ICD-10-CM | POA: Diagnosis not present

## 2016-08-04 DIAGNOSIS — I10 Essential (primary) hypertension: Secondary | ICD-10-CM | POA: Insufficient documentation

## 2016-08-04 DIAGNOSIS — R600 Localized edema: Secondary | ICD-10-CM

## 2016-08-04 DIAGNOSIS — Z8719 Personal history of other diseases of the digestive system: Secondary | ICD-10-CM

## 2016-08-04 DIAGNOSIS — R609 Edema, unspecified: Secondary | ICD-10-CM

## 2016-08-04 DIAGNOSIS — R0602 Shortness of breath: Secondary | ICD-10-CM | POA: Insufficient documentation

## 2016-08-04 LAB — BASIC METABOLIC PANEL
Anion gap: 4 — ABNORMAL LOW (ref 5–15)
BUN: 6 mg/dL (ref 6–20)
CO2: 25 mmol/L (ref 22–32)
CREATININE: 0.94 mg/dL (ref 0.61–1.24)
Calcium: 8 mg/dL — ABNORMAL LOW (ref 8.9–10.3)
Chloride: 106 mmol/L (ref 101–111)
GFR calc Af Amer: >60 mL/min (ref >60)
GLUCOSE: 98 mg/dL (ref 65–99)
Potassium: 3.4 mmol/L — ABNORMAL LOW (ref 3.5–5.1)
SODIUM: 135 mmol/L (ref 135–145)

## 2016-08-04 LAB — CBC
HEMATOCRIT: 31.4 % — AB (ref 39.0–52.0)
Hemoglobin: 10.1 g/dL — ABNORMAL LOW (ref 13.0–17.0)
MCH: 25.6 pg — ABNORMAL LOW (ref 26.0–34.0)
MCHC: 32.2 g/dL (ref 30.0–36.0)
MCV: 79.5 fL (ref 78.0–100.0)
PLATELETS: 63 10*3/uL — AB (ref 150–400)
RBC: 3.95 MIL/uL — ABNORMAL LOW (ref 4.22–5.81)
RDW: 23 % — AB (ref 11.5–15.5)
WBC: 4.6 10*3/uL (ref 4.0–10.5)

## 2016-08-04 LAB — I-STAT TROPONIN, ED: Troponin i, poc: 0.01 ng/mL (ref 0.00–0.08)

## 2016-08-04 MED ORDER — FUROSEMIDE 40 MG PO TABS: 40.0000 mg | ORAL_TABLET | Freq: Every day | ORAL | 1 refills | Status: DC

## 2016-08-04 MED ORDER — MORPHINE SULFATE (PF) 4 MG/ML IV SOLN
4.0000 mg | Freq: Once | INTRAVENOUS | Status: AC
  Administered 2016-08-04: 4 mg via INTRAVENOUS
  Filled 2016-08-04: qty 1

## 2016-08-04 MED ORDER — FUROSEMIDE 10 MG/ML IJ SOLN
40.0000 mg | Freq: Once | INTRAMUSCULAR | Status: AC
  Administered 2016-08-04: 40 mg via INTRAVENOUS
  Filled 2016-08-04: qty 4

## 2016-08-04 NOTE — ED Triage Notes (Signed)
Pt reports swelling to bilateral feet and legs that extends to his thighs associated with SOB. Denies CP or heart problems. He was sent from Urgent Care.

## 2016-08-04 NOTE — Discharge Instructions (Signed)
Due to your swelling and difficulty breathing, recommend go to ER now for further evaluation.

## 2016-08-04 NOTE — ED Triage Notes (Signed)
Pt here for fluid retention to bilateral legs and abd.... Sts he was seen at [facility] ER on 6/28 for similar sx  Does not have a PCP  Hx of Cirrhosis.  Sx also include SOB upon exertion   Sts he ran out of Lasix this am.   Slow gait... A&O x4.... NAD

## 2016-08-04 NOTE — ED Notes (Signed)
Pt requesting pain medication. Dr. Stark Jock informed.

## 2016-08-04 NOTE — ED Notes (Signed)
Pt updated via iConnect.

## 2016-08-04 NOTE — ED Provider Notes (Signed)
CSN: [id number]     Arrival date & time 08/04/16  1303 History   First MD Initiated Contact with Patient 08/04/16 1456     Chief Complaint  Patient presents with  . Leg Swelling   (Consider location/radiation/quality/duration/timing/severity/associated sxs/prior Treatment) 66 year old male presents with continuing worsening of peripheral edema of both legs. Has spread up his legs into his lower abdomen. He is also having shortness of breath, mainly on exertion and has a history of cirrhosis. He has a history of HTN and Diabetes. He has been to the ER 3 times in the past month for similar symptoms. Last visit to the ER was on 07/20/16 and was placed on Lasix 20mg . His labs showed elevated liver enzymes and low potassium and platelets. He indicates he has been taking Spironolactone as well as Potassium supplement (but not listed in current meds). He is having significant pain in his legs and his abdominal hernia and requests more pain medication. He has not found a PCP due to financial issues- has applied for Medicaid and just ran out of his Lasix this morning, although it does not appear to be helping. He is concerned over worsening of symptoms.    The history is provided by the patient.    Past Medical History:  Diagnosis Date  . Abdominal hernia   . Arthritis    "hands" (02/16/2016)  . Bright red rectal bleeding    "@ least 1-2 times/month; sometimes more" (02/16/2016)  . Cirrhosis ([place])   . Hypertension   . Type II diabetes mellitus ([place])    Past Surgical History:  Procedure Laterality Date  . COLONOSCOPY N/A 02/17/2016   Procedure: COLONOSCOPY;  Surgeon: [name], MD;  Location: [facility] ENDOSCOPY;  Service: Endoscopy;  Laterality: N/A;  . ESOPHAGOGASTRODUODENOSCOPY N/A 02/17/2016   Procedure: ESOPHAGOGASTRODUODENOSCOPY (EGD);  Surgeon: [name], MD;  Location: [facility] ENDOSCOPY;  Service: Endoscopy;  Laterality: N/A;  . FRACTURE SURGERY    . [place]   "has steel  rods in it"   Family History  Problem Relation Age of Onset  . Hypertension Other    Social History  Substance Use Topics  . Smoking status: Former Smoker    Packs/day: 0.50    Years: 38.00    Types: Cigarettes    Quit date: 2009  . Smokeless tobacco: Never Used  . Alcohol use 25.2 oz/week    42 Cans of beer per week     Comment: quit two weeks ago    Review of Systems  Constitutional: Positive for activity change and fatigue. Negative for fever.  HENT: Negative for facial swelling and sore throat.   Respiratory: Positive for shortness of breath. Negative for cough, chest tightness and wheezing.   Cardiovascular: Positive for leg swelling. Negative for chest pain.  Gastrointestinal: Positive for abdominal distention, abdominal pain and nausea. Negative for diarrhea and vomiting.  Genitourinary: Positive for decreased urine volume. Negative for hematuria.  Musculoskeletal: Positive for arthralgias and gait problem. Negative for neck pain and neck stiffness.  Skin: Positive for color change and wound.  Neurological: Negative for numbness.  Hematological: Bruises/bleeds easily.    Allergies  Patient has no known allergies.  Home Medications   Prior to Admission medications   Medication Sig Start Date End Date Taking? Authorizing Provider  atorvastatin (LIPITOR) 80 MG tablet Take 80 mg by mouth daily. 06/27/16  Yes [provider]  carvedilol (COREG) 6.25 MG tablet Take 6.25 mg by mouth 2 (two) times daily.  06/27/16  Yes [provider]  ferrous sulfate 325 (65 FE) MG tablet Take 325 mg by mouth daily. 06/28/16  Yes [provider]  furosemide (LASIX) 20 MG tablet Take 1 tablet (20 mg total) by mouth daily. 07/20/16  Yes Upstill, Shari, PA-C  losartan (COZAAR) 50 MG tablet Take 50 mg by mouth daily. 06/28/16  Yes [provider]  hydrOXYzine (ATARAX/VISTARIL) 10 MG tablet Take 1 tablet (10 mg total) by mouth 3 (three) times daily as needed. Patient  not taking: Reported on 07/20/2016 03/06/16   [name], MD  Multiple Vitamin (MULTIVITAMIN WITH MINERALS) TABS tablet Take 1 tablet by mouth daily. Patient not taking: Reported on 07/20/2016 02/18/16   [name], MD  oxyCODONE-acetaminophen (PERCOCET/ROXICET) 5-325 MG tablet Take 1 tablet by mouth every 8 (eight) hours as needed for severe pain. Patient not taking: Reported on 07/20/2016 02/18/16   [name], MD  spironolactone (ALDACTONE) 50 MG tablet Take 1 tablet (50 mg total) by mouth daily. Patient not taking: Reported on 07/20/2016 03/06/16   [name], MD   Meds Ordered and Administered this Visit  Medications - No data to display  BP 117/81 (BP Location: Right Arm)   Pulse 92   Temp 97.6 F (36.4 C)   Resp (!) 22   SpO2 100%  No data found.   Physical Exam  Constitutional: He is oriented to person, place, and time. He appears well-developed and well-nourished. He appears ill. No distress.  He is resting comfortably in a chair but moves slowly changing positions  HENT:  Head: Normocephalic and atraumatic.  Right Ear: Hearing normal.  Left Ear: Hearing normal.  Nose: Nose normal.  Mouth/Throat: Oropharynx is clear and moist.  Neck: Normal range of motion. Neck supple.  Cardiovascular: Normal rate and regular rhythm.   Pulmonary/Chest: Tachypnea noted. No respiratory distress. He has decreased breath sounds in the right upper field, the right lower field, the left upper field and the left lower field. He has no wheezes. He has no rhonchi. He has rales in the right lower field and the left lower field.  Abdominal: Bowel sounds are normal. He exhibits distension and ascites. There is generalized tenderness. A hernia is present.    2 to 3 + edema present bilaterally  Musculoskeletal: He exhibits edema and tenderness.       Right lower leg: He exhibits tenderness, swelling and edema.       Left lower leg: He exhibits tenderness, swelling and edema.        Legs: Varying skin color and 3+ pitting edema present from toes to upper thighs. No hair growth on lower legs.   Neurological: He is alert and oriented to person, place, and time.  Skin: Skin is warm. Capillary refill takes more than 3 seconds.  Psychiatric: He has a normal mood and affect. His behavior is normal.    Urgent Care Course     Procedures (including critical care time)  Labs Review Labs Reviewed - No data to display  Imaging Review No results found.   Visual Acuity Review  Right Eye Distance:   Left Eye Distance:   Bilateral Distance:    Right Eye Near:   Left Eye Near:    Bilateral Near:         MDM   1. Peripheral edema   2. Dyspnea on exertion   3. Ascites due to alcoholic cirrhosis (HCC)    Due to worsening of symptoms on current dose of Lasix and  more dyspnea, along with increased pain, recommend go to ER now for further evaluation. Patient understands and agrees with plan.     Katy Apo, NP 08/04/16 1538

## 2016-08-04 NOTE — ED Provider Notes (Signed)
[facility] DEPT Provider Note   CSN: [id number] Arrival date & time: 08/04/16  1552     History   Chief Complaint Chief Complaint  Patient presents with  . Leg Swelling    HPI [name] is a 66 y.o. male.  Patient is a 66 year old male with past medical history of hypertension, cirrhosis, diabetes presenting with complaints of lower leg swelling. This has been worsening for the past several months. He has been seen here in the ER on multiple occasions. He is currently taking Lasix, however this does not seem to be helping and he also took his last dose this morning. He does report some shortness of breath with exertion but denies any chest pain, fever, or cough. He was seen in urgent care this afternoon, then sent here for further evaluation.   The history is provided by the patient.    Past Medical History:  Diagnosis Date  . Abdominal hernia   . Arthritis    "hands" (02/16/2016)  . Bright red rectal bleeding    "@ least 1-2 times/month; sometimes more" (02/16/2016)  . Cirrhosis ([place])   . Hypertension   . Type II diabetes mellitus [facility])     Patient Active Problem List   Diagnosis Date Noted  . Anxiety 03/06/2016  . Insomnia 03/06/2016  . Hypertension 03/06/2016  . Hepatitis C antibody test positive 02/18/2016  . Thrombocytopenia ([place]) 02/16/2016  . Normochromic normocytic anemia 02/16/2016  . Alcohol abuse 02/16/2016  . Cirrhosis of liver ([place]) 02/16/2016    Past Surgical History:  Procedure Laterality Date  . COLONOSCOPY N/A 02/17/2016   Procedure: COLONOSCOPY;  Surgeon: [name], MD;  Location: [facility] ENDOSCOPY;  Service: Endoscopy;  Laterality: N/A;  . ESOPHAGOGASTRODUODENOSCOPY N/A 02/17/2016   Procedure: ESOPHAGOGASTRODUODENOSCOPY (EGD);  Surgeon: [name], MD;  Location: [facility] ENDOSCOPY;  Service: Endoscopy;  Laterality: N/A;  . FRACTURE SURGERY    . [place]   "has steel rods in it"       Home Medications    Prior to  Admission medications   Medication Sig Start Date End Date Taking? Authorizing Provider  atorvastatin (LIPITOR) 80 MG tablet Take 80 mg by mouth daily. 06/27/16   [provider]  carvedilol (COREG) 6.25 MG tablet Take 6.25 mg by mouth 2 (two) times daily. 06/27/16   [provider]  ferrous sulfate 325 (65 FE) MG tablet Take 325 mg by mouth daily. 06/28/16   [provider]  furosemide (LASIX) 20 MG tablet Take 1 tablet (20 mg total) by mouth daily. 07/20/16   Charlann Lange, PA-C  hydrOXYzine (ATARAX/VISTARIL) 10 MG tablet Take 1 tablet (10 mg total) by mouth 3 (three) times daily as needed. Patient not taking: Reported on 07/20/2016 03/06/16   [name], MD  losartan (COZAAR) 50 MG tablet Take 50 mg by mouth daily. 06/28/16   [provider]  Multiple Vitamin (MULTIVITAMIN WITH MINERALS) TABS tablet Take 1 tablet by mouth daily. Patient not taking: Reported on 07/20/2016 02/18/16   [name], MD  oxyCODONE-acetaminophen (PERCOCET/ROXICET) 5-325 MG tablet Take 1 tablet by mouth every 8 (eight) hours as needed for severe pain. Patient not taking: Reported on 07/20/2016 02/18/16   [name], MD  spironolactone (ALDACTONE) 50 MG tablet Take 1 tablet (50 mg total) by mouth daily. Patient not taking: Reported on 07/20/2016 03/06/16   [name], MD    Family History Family History  Problem Relation Age of Onset  . Hypertension Other  Social History Social History  Substance Use Topics  . Smoking status: Former Smoker    Packs/day: 0.50    Years: 38.00    Types: Cigarettes    Quit date: 2009  . Smokeless tobacco: Never Used  . Alcohol use 25.2 oz/week    42 Cans of beer per week     Comment: quit two weeks ago     Allergies   Patient has no known allergies.   Review of Systems Review of Systems  All other systems reviewed and are negative.    Physical Exam Updated Vital Signs BP (!) 145/97 (BP Location: Left Arm)   Pulse 86    Temp 98.2 F (36.8 C) (Oral)   Resp 19   SpO2 96%   Physical Exam  Constitutional: He is oriented to person, place, and time. He appears well-developed and well-nourished. No distress.  HENT:  Head: Normocephalic and atraumatic.  Mouth/Throat: Oropharynx is clear and moist.  Neck: Normal range of motion. Neck supple.  Cardiovascular: Normal rate and regular rhythm.  Exam reveals no friction rub.   No murmur heard. Pulmonary/Chest: Effort normal and breath sounds normal. No respiratory distress. He has no wheezes. He has no rales.  Abdominal: Soft. Bowel sounds are normal. He exhibits no distension. There is no tenderness.  Musculoskeletal: Normal range of motion. He exhibits edema.  Patient is noted to have 3+ pitting edema of both lower extremities extending into the thighs. DP pulses are palpable.  Neurological: He is alert and oriented to person, place, and time. Coordination normal.  Skin: Skin is warm and dry. He is not diaphoretic.  Nursing note and vitals reviewed.    ED Treatments / Results  Labs (all labs ordered are listed, but only abnormal results are displayed) Labs Reviewed  BASIC METABOLIC PANEL - Abnormal; Notable for the following:       Result Value   Potassium 3.4 (*)    Calcium 8.0 (*)    Anion gap 4 (*)    All other components within normal limits  CBC - Abnormal; Notable for the following:    RBC 3.95 (*)    Hemoglobin 10.1 (*)    HCT 31.4 (*)    MCH 25.6 (*)    RDW 23.0 (*)    Platelets 63 (*)    All other components within normal limits  I-STAT TROPOININ, ED    EKG  EKG Interpretation None       Radiology Dg Chest 2 View  Result Date: 08/04/2016 CLINICAL DATA:  Bilateral leg swelling, pain, shortness of breath, cough EXAM: CHEST  2 VIEW COMPARISON:  07/20/2016 FINDINGS: Mild elevation of the right hemidiaphragm, stable. Bibasilar atelectasis. Heart is upper limits normal in size. No effusions or acute bony abnormality. IMPRESSION:  Bibasilar atelectasis. Electronically Signed   By: Rolm Baptise M.D.   On: 08/04/2016 18:03    Procedures Procedures (including critical care time)  Medications Ordered in ED Medications  furosemide (LASIX) injection 40 mg (not administered)     Initial Impression / Assessment and Plan / ED Course  I have reviewed the triage vital signs and the nursing notes.  Pertinent labs & imaging results that were available during my care of the patient were reviewed by me and considered in my medical decision making (see chart for details).  Patient with history of cirrhosis presenting with lower extremity edema. This is been ongoing problem for many months. He took his last dose of Lasix this morning and presents with  increasing pain. His workup today reveals normal renal function, unchanged EKG, negative troponin, and chest x-ray with no evidence for CHF.  He has had low albumin levels in the past and I suspect his edema is related to his liver disease. I see no evidence for CHF. He was given IV Lasix here in the ER and will be discharged with an increased dose of Lasix, pain medicine, and follow-up with his primary doctor.  Final Clinical Impressions(s) / ED Diagnoses   Final diagnoses:  None    New Prescriptions New Prescriptions   No medications on file     Veryl Speak, MD 08/04/16 2312

## 2016-08-04 NOTE — Discharge Instructions (Signed)
Lasix as prescribed.  Please follow-up with a primary Dr. for reassessment in the next 2-3 weeks.

## 2016-08-13 ENCOUNTER — Other Ambulatory Visit: Payer: Self-pay

## 2016-08-13 ENCOUNTER — Other Ambulatory Visit (HOSPITAL_COMMUNITY): Payer: Self-pay

## 2016-08-13 ENCOUNTER — Emergency Department (HOSPITAL_COMMUNITY): Payer: Medicaid Other

## 2016-08-13 ENCOUNTER — Inpatient Hospital Stay (HOSPITAL_COMMUNITY)
Admission: EM | Admit: 2016-08-13 | Discharge: 2016-08-16 | DRG: 433 | Disposition: A | Discharge status: Home / Self Care | Payer: Medicaid Other | Attending: Family Medicine | Admitting: Family Medicine

## 2016-08-13 DIAGNOSIS — R6510 Systemic inflammatory response syndrome (SIRS) of non-infectious origin without acute organ dysfunction: Secondary | ICD-10-CM | POA: Diagnosis present

## 2016-08-13 DIAGNOSIS — E1169 Type 2 diabetes mellitus with other specified complication: Secondary | ICD-10-CM | POA: Diagnosis present

## 2016-08-13 DIAGNOSIS — I4581 Long QT syndrome: Secondary | ICD-10-CM | POA: Diagnosis present

## 2016-08-13 DIAGNOSIS — Z9119 Patient's noncompliance with other medical treatment and regimen: Secondary | ICD-10-CM

## 2016-08-13 DIAGNOSIS — D696 Thrombocytopenia, unspecified: Secondary | ICD-10-CM | POA: Diagnosis present

## 2016-08-13 DIAGNOSIS — N4889 Other specified disorders of penis: Secondary | ICD-10-CM | POA: Diagnosis present

## 2016-08-13 DIAGNOSIS — Z79899 Other long term (current) drug therapy: Secondary | ICD-10-CM

## 2016-08-13 DIAGNOSIS — R791 Abnormal coagulation profile: Secondary | ICD-10-CM | POA: Diagnosis present

## 2016-08-13 DIAGNOSIS — B1920 Unspecified viral hepatitis C without hepatic coma: Secondary | ICD-10-CM | POA: Diagnosis present

## 2016-08-13 DIAGNOSIS — K7460 Unspecified cirrhosis of liver: Secondary | ICD-10-CM | POA: Diagnosis present

## 2016-08-13 DIAGNOSIS — N5089 Other specified disorders of the male genital organs: Secondary | ICD-10-CM

## 2016-08-13 DIAGNOSIS — Z8249 Family history of ischemic heart disease and other diseases of the circulatory system: Secondary | ICD-10-CM

## 2016-08-13 DIAGNOSIS — I10 Essential (primary) hypertension: Secondary | ICD-10-CM | POA: Diagnosis present

## 2016-08-13 DIAGNOSIS — E669 Obesity, unspecified: Secondary | ICD-10-CM

## 2016-08-13 DIAGNOSIS — F1021 Alcohol dependence, in remission: Secondary | ICD-10-CM | POA: Diagnosis present

## 2016-08-13 DIAGNOSIS — R9431 Abnormal electrocardiogram [ECG] [EKG]: Secondary | ICD-10-CM | POA: Diagnosis present

## 2016-08-13 DIAGNOSIS — K7031 Alcoholic cirrhosis of liver with ascites: Principal | ICD-10-CM | POA: Diagnosis present

## 2016-08-13 DIAGNOSIS — E1165 Type 2 diabetes mellitus with hyperglycemia: Secondary | ICD-10-CM | POA: Diagnosis present

## 2016-08-13 DIAGNOSIS — E119 Type 2 diabetes mellitus without complications: Secondary | ICD-10-CM | POA: Diagnosis present

## 2016-08-13 DIAGNOSIS — Z87891 Personal history of nicotine dependence: Secondary | ICD-10-CM

## 2016-08-13 DIAGNOSIS — D6959 Other secondary thrombocytopenia: Secondary | ICD-10-CM | POA: Diagnosis present

## 2016-08-13 DIAGNOSIS — R188 Other ascites: Secondary | ICD-10-CM

## 2016-08-13 DIAGNOSIS — D649 Anemia, unspecified: Secondary | ICD-10-CM | POA: Diagnosis present

## 2016-08-13 LAB — URINALYSIS, ROUTINE W REFLEX MICROSCOPIC
BILIRUBIN URINE: NEGATIVE
GLUCOSE, UA: NEGATIVE mg/dL
HGB URINE DIPSTICK: NEGATIVE
Ketones, ur: NEGATIVE mg/dL
Leukocytes, UA: NEGATIVE
Nitrite: NEGATIVE
PH: 5 (ref 5.0–8.0)
Protein, ur: NEGATIVE mg/dL
SPECIFIC GRAVITY, URINE: 1.015 (ref 1.005–1.030)

## 2016-08-13 LAB — COMPREHENSIVE METABOLIC PANEL
ALK PHOS: 94 U/L (ref 38–126)
ALT: 23 U/L (ref 17–63)
ANION GAP: 8 (ref 5–15)
AST: 50 U/L — ABNORMAL HIGH (ref 15–41)
Albumin: 2.1 g/dL — ABNORMAL LOW (ref 3.5–5.0)
BILIRUBIN TOTAL: 2.5 mg/dL — AB (ref 0.3–1.2)
BUN: 7 mg/dL (ref 6–20)
CALCIUM: 7.9 mg/dL — AB (ref 8.9–10.3)
CO2: 23 mmol/L (ref 22–32)
CREATININE: 1.02 mg/dL (ref 0.61–1.24)
Chloride: 103 mmol/L (ref 101–111)
GFR calc non Af Amer: >60 mL/min (ref >60)
GLUCOSE: 148 mg/dL — AB (ref 65–99)
Potassium: 3.5 mmol/L (ref 3.5–5.1)
Sodium: 134 mmol/L — ABNORMAL LOW (ref 135–145)
TOTAL PROTEIN: 6.2 g/dL — AB (ref 6.5–8.1)

## 2016-08-13 LAB — CBC WITH DIFFERENTIAL/PLATELET
Basophils Absolute: 0.1 10*3/uL (ref 0.0–0.1)
Basophils Relative: 1 %
EOS PCT: 3 %
Eosinophils Absolute: 0.2 10*3/uL (ref 0.0–0.7)
HCT: 29.5 % — ABNORMAL LOW (ref 39.0–52.0)
Hemoglobin: 10 g/dL — ABNORMAL LOW (ref 13.0–17.0)
LYMPHS ABS: 1.6 10*3/uL (ref 0.7–4.0)
Lymphocytes Relative: 23 %
MCH: 26.8 pg (ref 26.0–34.0)
MCHC: 33.9 g/dL (ref 30.0–36.0)
MCV: 79.1 fL (ref 78.0–100.0)
MONO ABS: 0.6 10*3/uL (ref 0.1–1.0)
Monocytes Relative: 9 %
NEUTROS ABS: 4.4 10*3/uL (ref 1.7–7.7)
Neutrophils Relative %: 64 %
PLATELETS: 67 10*3/uL — AB (ref 150–400)
RBC: 3.73 MIL/uL — AB (ref 4.22–5.81)
RDW: 22.2 % — AB (ref 11.5–15.5)
WBC: 6.9 10*3/uL (ref 4.0–10.5)

## 2016-08-13 LAB — PROTIME-INR
INR: 2.1
Prothrombin Time: 23.9 seconds — ABNORMAL HIGH (ref 11.4–15.2)

## 2016-08-13 LAB — I-STAT CG4 LACTIC ACID, ED: Lactic Acid, Venous: 3.23 mmol/L (ref 0.5–1.9)

## 2016-08-13 MED ORDER — SODIUM CHLORIDE 0.9 % IV BOLUS (SEPSIS)
500.0000 mL | Freq: Once | INTRAVENOUS | Status: DC
  Administered 2016-08-14: 500 mL via INTRAVENOUS

## 2016-08-13 MED ORDER — VANCOMYCIN HCL 10 G IV SOLR
1500.0000 mg | Freq: Once | INTRAVENOUS | Status: AC
  Administered 2016-08-13: 1500 mg via INTRAVENOUS
  Filled 2016-08-13: qty 1500

## 2016-08-13 MED ORDER — DEXTROSE 5 % IV SOLN
2.0000 g | Freq: Once | INTRAVENOUS | Status: AC
  Administered 2016-08-13: 2 g via INTRAVENOUS
  Filled 2016-08-13: qty 2

## 2016-08-13 MED ORDER — OXYCODONE-ACETAMINOPHEN 5-325 MG PO TABS
1.0000 | ORAL_TABLET | Freq: Once | ORAL | Status: AC
  Administered 2016-08-13: 1 via ORAL
  Filled 2016-08-13: qty 1

## 2016-08-13 MED ORDER — METRONIDAZOLE IN NACL 5-0.79 MG/ML-% IV SOLN
500.0000 mg | Freq: Once | INTRAVENOUS | Status: AC
  Administered 2016-08-13: 500 mg via INTRAVENOUS
  Filled 2016-08-13: qty 100

## 2016-08-13 NOTE — ED Triage Notes (Signed)
Brought by ems from home for c/o fever, nonproductive cough, swelling from waist down both legs.  Per algorithm meets criteria for sepsis order set.  Noted to be dyspneic at rest.  Sat on arrival of ems 89%.  Placed on 2L via ems and reported to go up to 94%.  Sat now 93-95% on room air.  Patient reports these symptoms are on and off since April.

## 2016-08-14 ENCOUNTER — Inpatient Hospital Stay (HOSPITAL_COMMUNITY): Payer: Medicaid Other

## 2016-08-14 ENCOUNTER — Encounter (HOSPITAL_COMMUNITY): Payer: Self-pay | Admitting: Internal Medicine

## 2016-08-14 DIAGNOSIS — Z8249 Family history of ischemic heart disease and other diseases of the circulatory system: Secondary | ICD-10-CM | POA: Diagnosis not present

## 2016-08-14 DIAGNOSIS — B1920 Unspecified viral hepatitis C without hepatic coma: Secondary | ICD-10-CM | POA: Diagnosis present

## 2016-08-14 DIAGNOSIS — E1169 Type 2 diabetes mellitus with other specified complication: Secondary | ICD-10-CM | POA: Diagnosis present

## 2016-08-14 DIAGNOSIS — E669 Obesity, unspecified: Secondary | ICD-10-CM

## 2016-08-14 DIAGNOSIS — Z87891 Personal history of nicotine dependence: Secondary | ICD-10-CM | POA: Diagnosis not present

## 2016-08-14 DIAGNOSIS — D6959 Other secondary thrombocytopenia: Secondary | ICD-10-CM | POA: Diagnosis present

## 2016-08-14 DIAGNOSIS — I4581 Long QT syndrome: Secondary | ICD-10-CM | POA: Diagnosis present

## 2016-08-14 DIAGNOSIS — R188 Other ascites: Secondary | ICD-10-CM

## 2016-08-14 DIAGNOSIS — E119 Type 2 diabetes mellitus without complications: Secondary | ICD-10-CM | POA: Diagnosis present

## 2016-08-14 DIAGNOSIS — Z79899 Other long term (current) drug therapy: Secondary | ICD-10-CM | POA: Diagnosis not present

## 2016-08-14 DIAGNOSIS — E1165 Type 2 diabetes mellitus with hyperglycemia: Secondary | ICD-10-CM | POA: Diagnosis present

## 2016-08-14 DIAGNOSIS — N4889 Other specified disorders of penis: Secondary | ICD-10-CM | POA: Diagnosis present

## 2016-08-14 DIAGNOSIS — D649 Anemia, unspecified: Secondary | ICD-10-CM

## 2016-08-14 DIAGNOSIS — N5089 Other specified disorders of the male genital organs: Secondary | ICD-10-CM | POA: Diagnosis present

## 2016-08-14 DIAGNOSIS — R6510 Systemic inflammatory response syndrome (SIRS) of non-infectious origin without acute organ dysfunction: Secondary | ICD-10-CM | POA: Diagnosis present

## 2016-08-14 DIAGNOSIS — K7031 Alcoholic cirrhosis of liver with ascites: Secondary | ICD-10-CM | POA: Diagnosis present

## 2016-08-14 DIAGNOSIS — R791 Abnormal coagulation profile: Secondary | ICD-10-CM | POA: Diagnosis present

## 2016-08-14 DIAGNOSIS — Z9119 Patient's noncompliance with other medical treatment and regimen: Secondary | ICD-10-CM | POA: Diagnosis not present

## 2016-08-14 DIAGNOSIS — I10 Essential (primary) hypertension: Secondary | ICD-10-CM | POA: Diagnosis present

## 2016-08-14 DIAGNOSIS — A419 Sepsis, unspecified organism: Secondary | ICD-10-CM

## 2016-08-14 DIAGNOSIS — R9431 Abnormal electrocardiogram [ECG] [EKG]: Secondary | ICD-10-CM | POA: Diagnosis present

## 2016-08-14 DIAGNOSIS — F1021 Alcohol dependence, in remission: Secondary | ICD-10-CM | POA: Diagnosis present

## 2016-08-14 HISTORY — PX: IR PARACENTESIS: IMG2679

## 2016-08-14 LAB — CBC
HEMATOCRIT: 23.7 % — AB (ref 39.0–52.0)
Hemoglobin: 7.7 g/dL — ABNORMAL LOW (ref 13.0–17.0)
MCH: 25.3 pg — ABNORMAL LOW (ref 26.0–34.0)
MCHC: 32.5 g/dL (ref 30.0–36.0)
MCV: 78 fL (ref 78.0–100.0)
Platelets: 50 10*3/uL — ABNORMAL LOW (ref 150–400)
RBC: 3.04 MIL/uL — ABNORMAL LOW (ref 4.22–5.81)
RDW: 21.8 % — AB (ref 11.5–15.5)
WBC: 7.3 10*3/uL (ref 4.0–10.5)

## 2016-08-14 LAB — LACTATE DEHYDROGENASE, PLEURAL OR PERITONEAL FLUID: LD FL: 49 U/L — AB (ref 3–23)

## 2016-08-14 LAB — RETICULOCYTES
RBC.: 3.71 MIL/uL — AB (ref 4.22–5.81)
RETIC COUNT ABSOLUTE: 103.9 10*3/uL (ref 19.0–186.0)
Retic Ct Pct: 2.8 % (ref 0.4–3.1)

## 2016-08-14 LAB — BODY FLUID CELL COUNT WITH DIFFERENTIAL
Eos, Fluid: NONE SEEN %
LYMPHS FL: 3 %
MONOCYTE-MACROPHAGE-SEROUS FLUID: 8 % — AB (ref 50–90)
Neutrophil Count, Fluid: 89 % — ABNORMAL HIGH (ref 0–25)
Total Nucleated Cell Count, Fluid: 1720 cu mm — ABNORMAL HIGH (ref 0–1000)

## 2016-08-14 LAB — FOLATE: FOLATE: 14.9 ng/mL (ref >5.9)

## 2016-08-14 LAB — BASIC METABOLIC PANEL
Anion gap: 5 (ref 5–15)
BUN: 8 mg/dL (ref 6–20)
CHLORIDE: 107 mmol/L (ref 101–111)
CO2: 25 mmol/L (ref 22–32)
Calcium: 7.3 mg/dL — ABNORMAL LOW (ref 8.9–10.3)
Creatinine, Ser: 1.01 mg/dL (ref 0.61–1.24)
GFR calc Af Amer: >60 mL/min (ref >60)
GFR calc non Af Amer: >60 mL/min (ref >60)
GLUCOSE: 115 mg/dL — AB (ref 65–99)
POTASSIUM: 3.6 mmol/L (ref 3.5–5.1)
Sodium: 137 mmol/L (ref 135–145)

## 2016-08-14 LAB — HEPATIC FUNCTION PANEL
ALT: 17 U/L (ref 17–63)
AST: 49 U/L — AB (ref 15–41)
Albumin: 2 g/dL — ABNORMAL LOW (ref 3.5–5.0)
Alkaline Phosphatase: 80 U/L (ref 38–126)
BILIRUBIN DIRECT: 1.1 mg/dL — AB (ref 0.1–0.5)
BILIRUBIN INDIRECT: 2.5 mg/dL — AB (ref 0.3–0.9)
BILIRUBIN TOTAL: 3.6 mg/dL — AB (ref 0.3–1.2)
Total Protein: 6.2 g/dL — ABNORMAL LOW (ref 6.5–8.1)

## 2016-08-14 LAB — FERRITIN: FERRITIN: 32 ng/mL (ref 24–336)

## 2016-08-14 LAB — HEMOGLOBIN AND HEMATOCRIT, BLOOD
HCT: 29.5 % — ABNORMAL LOW (ref 39.0–52.0)
HEMOGLOBIN: 9.6 g/dL — AB (ref 13.0–17.0)

## 2016-08-14 LAB — GLUCOSE, PLEURAL OR PERITONEAL FLUID: GLUCOSE FL: 106 mg/dL

## 2016-08-14 LAB — ETHANOL: Alcohol, Ethyl (B): <5 mg/dL (ref <5)

## 2016-08-14 LAB — I-STAT CG4 LACTIC ACID, ED: Lactic Acid, Venous: 3 mmol/L (ref 0.5–1.9)

## 2016-08-14 LAB — VITAMIN B12: Vitamin B-12: 1988 pg/mL — ABNORMAL HIGH (ref 180–914)

## 2016-08-14 LAB — IRON AND TIBC
Iron: 168 ug/dL (ref 45–182)
Saturation Ratios: 62 % — ABNORMAL HIGH (ref 17.9–39.5)
TIBC: 272 ug/dL (ref 250–450)
UIBC: 104 ug/dL

## 2016-08-14 LAB — AMYLASE, PLEURAL OR PERITONEAL FLUID: Amylase, Fluid: 21 U/L

## 2016-08-14 LAB — GRAM STAIN

## 2016-08-14 LAB — LACTIC ACID, PLASMA
LACTIC ACID, VENOUS: 2.9 mmol/L — AB (ref 0.5–1.9)
Lactic Acid, Venous: 1.8 mmol/L (ref 0.5–1.9)

## 2016-08-14 LAB — ALBUMIN, PLEURAL OR PERITONEAL FLUID

## 2016-08-14 LAB — PROTEIN, PLEURAL OR PERITONEAL FLUID: Total protein, fluid: <3 g/dL

## 2016-08-14 LAB — PROCALCITONIN: Procalcitonin: 0.11 ng/mL

## 2016-08-14 MED ORDER — CEFTRIAXONE SODIUM 2 G IJ SOLR
2.0000 g | INTRAMUSCULAR | Status: DC
  Administered 2016-08-14 – 2016-08-15 (×2): 2 g via INTRAVENOUS
  Filled 2016-08-14 (×3): qty 2

## 2016-08-14 MED ORDER — ADULT MULTIVITAMIN W/MINERALS CH
1.0000 | ORAL_TABLET | Freq: Every day | ORAL | Status: DC
  Administered 2016-08-14 – 2016-08-16 (×3): 1 via ORAL
  Filled 2016-08-14 (×3): qty 1

## 2016-08-14 MED ORDER — FUROSEMIDE 40 MG PO TABS
40.0000 mg | ORAL_TABLET | Freq: Every day | ORAL | Status: DC
  Administered 2016-08-15 – 2016-08-16 (×2): 40 mg via ORAL
  Filled 2016-08-14 (×2): qty 1

## 2016-08-14 MED ORDER — FERROUS SULFATE 325 (65 FE) MG PO TABS
325.0000 mg | ORAL_TABLET | Freq: Every day | ORAL | Status: DC
  Administered 2016-08-14 – 2016-08-16 (×3): 325 mg via ORAL
  Filled 2016-08-14 (×3): qty 1

## 2016-08-14 MED ORDER — DOCUSATE SODIUM 100 MG PO CAPS
100.0000 mg | ORAL_CAPSULE | Freq: Two times a day (BID) | ORAL | Status: DC
  Administered 2016-08-14 – 2016-08-16 (×6): 100 mg via ORAL
  Filled 2016-08-14 (×6): qty 1

## 2016-08-14 MED ORDER — FUROSEMIDE 40 MG PO TABS
40.0000 mg | ORAL_TABLET | Freq: Two times a day (BID) | ORAL | Status: DC
  Administered 2016-08-14: 40 mg via ORAL
  Filled 2016-08-14: qty 1

## 2016-08-14 MED ORDER — SPIRONOLACTONE 25 MG PO TABS
50.0000 mg | ORAL_TABLET | Freq: Every day | ORAL | Status: DC
  Administered 2016-08-14: 50 mg via ORAL
  Filled 2016-08-14: qty 2

## 2016-08-14 MED ORDER — ALBUMIN HUMAN 25 % IV SOLN
12.5000 g | Freq: Once | INTRAVENOUS | Status: AC
  Administered 2016-08-14: 12.5 g via INTRAVENOUS
  Filled 2016-08-14: qty 50

## 2016-08-14 MED ORDER — ONDANSETRON HCL 4 MG PO TABS: 4.0000 mg | ORAL_TABLET | Freq: Four times a day (QID) | ORAL | Status: DC | PRN

## 2016-08-14 MED ORDER — ACETAMINOPHEN 650 MG RE SUPP: 650.0000 mg | Freq: Four times a day (QID) | RECTAL | Status: DC | PRN

## 2016-08-14 MED ORDER — LIDOCAINE HCL 1 % IJ SOLN
INTRAMUSCULAR | Status: DC | PRN
  Administered 2016-08-14: 10 mL

## 2016-08-14 MED ORDER — OXYCODONE-ACETAMINOPHEN 5-325 MG PO TABS
1.0000 | ORAL_TABLET | Freq: Three times a day (TID) | ORAL | Status: DC | PRN
  Administered 2016-08-14 – 2016-08-16 (×4): 1 via ORAL
  Filled 2016-08-14 (×4): qty 1

## 2016-08-14 MED ORDER — ONDANSETRON HCL 4 MG/2ML IJ SOLN: 4.0000 mg | Freq: Four times a day (QID) | INTRAMUSCULAR | Status: DC | PRN

## 2016-08-14 MED ORDER — CARVEDILOL 6.25 MG PO TABS
6.2500 mg | ORAL_TABLET | Freq: Two times a day (BID) | ORAL | Status: DC
Start: 2016-08-14 — End: 2016-08-14
  Administered 2016-08-14 (×2): 6.25 mg via ORAL
  Filled 2016-08-14 (×2): qty 1

## 2016-08-14 MED ORDER — HYDROMORPHONE HCL 1 MG/ML IJ SOLN
1.0000 mg | INTRAMUSCULAR | Status: DC | PRN
  Administered 2016-08-14 – 2016-08-16 (×11): 1 mg via INTRAVENOUS
  Filled 2016-08-14 (×12): qty 1

## 2016-08-14 MED ORDER — ATORVASTATIN CALCIUM 80 MG PO TABS
80.0000 mg | ORAL_TABLET | Freq: Every day | ORAL | Status: DC
  Administered 2016-08-14 – 2016-08-15 (×2): 80 mg via ORAL
  Filled 2016-08-14 (×2): qty 1

## 2016-08-14 MED ORDER — LIDOCAINE HCL (PF) 1 % IJ SOLN
INTRAMUSCULAR | Status: AC
  Filled 2016-08-14: qty 30

## 2016-08-14 MED ORDER — VANCOMYCIN HCL 10 G IV SOLR
1250.0000 mg | Freq: Two times a day (BID) | INTRAVENOUS | Status: DC
  Administered 2016-08-14 – 2016-08-15 (×2): 1250 mg via INTRAVENOUS
  Filled 2016-08-14 (×3): qty 1250

## 2016-08-14 MED ORDER — LOSARTAN POTASSIUM 50 MG PO TABS
50.0000 mg | ORAL_TABLET | Freq: Every day | ORAL | Status: DC
  Administered 2016-08-14: 50 mg via ORAL
  Filled 2016-08-14: qty 1

## 2016-08-14 MED ORDER — ACETAMINOPHEN 325 MG PO TABS: 650.0000 mg | ORAL_TABLET | Freq: Four times a day (QID) | ORAL | Status: DC | PRN

## 2016-08-14 MED ORDER — METRONIDAZOLE IN NACL 5-0.79 MG/ML-% IV SOLN
500.0000 mg | Freq: Three times a day (TID) | INTRAVENOUS | Status: DC
  Administered 2016-08-14 – 2016-08-15 (×4): 500 mg via INTRAVENOUS
  Filled 2016-08-14 (×4): qty 100

## 2016-08-14 NOTE — Progress Notes (Signed)
Received report from [facility] from ED

## 2016-08-14 NOTE — Progress Notes (Signed)
[facility]                                                                              [name] Demographics  [name], is a 66 y.o. male, DOB - 19-Feb-1950, INO:[id number]  Admit date - 08/13/2016   Admitting Physician Karmen Bongo, MD  Outpatient Primary MD for the [name] is [name], No Pcp Per  Outpatient specialists:   LOS - 0  days   Medical records reviewed and are as summarized below:    Chief Complaint  [name] presents with  . Cough  . Fever  . Shortness of Breath       Brief summary   [name] is a 66 year old male with alcohol dependence in remission for last 90 days, diabetes, hypertension, and chronic cirrhosis presented with fever, anasarca, worsening since April. [name] reports that his usual weight. 100, currently 245. He also reports subjective fever and chills, coughing for about a month off and on, abdominal pain, leg pain. Has not seen a doctor as an outpatient since 2/18, Recent ER visits include: 7/13, 6/28, 6/17, 6/10 - each of these visits were associated with LE edema, thought to be due to his liver disease, discharged with an increased dose of Lasix and/or pain medicine and he was encouraged to f/u with PCP.   Assessment & Plan    Principal Problem:   Sepsis ([place]) with underlying alcoholic cirrhosis, r/o SBP - blood cultures negative so far - IR consulted for paracentesis, labs ordered, will follow cultures. 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 History of hepatitis C. [name] reports that he has quit drinking about 90 days ago. - Worsening anasarca, abdominal pain, paracentesis ordered today - Placed on Aldactone 50 mg daily, decrease Lasix 40 mg  daily, titrate diuretics as tolerated with BP - [facility] Jarold Song and since then he has been noncompliant. He will need GI referral. He reports that he has applied for Medicaid.    Normocytic anemia with Thrombocytopenia ([facility]) - Hemoglobin 7.7 this morning however no obvious bleeding, will repeat H&H, anemia panel, FOBT - He does have chronic cirrhosis and bone marrow suppression - If FOBT positive or any hematemesis, will need GI consult     Hypertension - Currently borderline BP, hold Coreg and losartan    Prolonged QT interval -Continue to monitor closely  Code Status: Full CODE STATUS DVT Prophylaxis:  SCD's Family Communication: Discussed in detail with the [name], all imaging results, lab results explained to the [name]   Disposition Plan:   Time Spent in minutes   25 minutes  Procedures:    Consultants:   Interventional radiology for paracentesis  Antimicrobials:   IV vancomycin 7/22  IV Rocephin 7/22  IV Flagyl 7/22   Medications  Scheduled Meds: . lidocaine (PF)      .  atorvastatin  80 mg Oral QHS  . carvedilol  6.25 mg Oral BID  . docusate sodium  100 mg Oral BID  . ferrous sulfate  325 mg Oral Daily  . furosemide  40 mg Oral BID  . losartan  50 mg Oral Daily  . multivitamin with minerals  1 tablet Oral Daily   Continuous Infusions: . albumin human    . cefTRIAXone (ROCEPHIN)  IV    . metronidazole 500 mg (08/14/16 0600)  . vancomycin Stopped (08/14/16 1015)   PRN Meds:.acetaminophen **OR** acetaminophen, HYDROmorphone (DILAUDID) injection, lidocaine, oxyCODONE-acetaminophen   Antibiotics   Anti-infectives    Start     Dose/Rate Route Frequency Ordered Stop   08/14/16 2200  cefTRIAXone (ROCEPHIN) 2 g in dextrose 5 % 50 mL IVPB     2 g 100 mL/hr over 30 Minutes Intravenous Every 24 hours 08/14/16 0123     08/14/16 1000  vancomycin (VANCOCIN) 1,250 mg in sodium chloride 0.9 % 250  mL IVPB     1,250 mg 166.7 mL/hr over 90 Minutes Intravenous Every 12 hours 08/14/16 0123     08/14/16 0115  metroNIDAZOLE (FLAGYL) IVPB 500 mg     500 mg 100 mL/hr over 60 Minutes Intravenous Every 8 hours 08/14/16 0114     08/13/16 2345  vancomycin (VANCOCIN) 1,500 mg in sodium chloride 0.9 % 500 mL IVPB     1,500 mg 250 mL/hr over 120 Minutes Intravenous  Once 08/13/16 2341 08/14/16 0157   08/13/16 2300  cefTRIAXone (ROCEPHIN) 2 g in dextrose 5 % 50 mL IVPB     2 g 100 mL/hr over 30 Minutes Intravenous  Once 08/13/16 2246 08/13/16 2327   08/13/16 2300  metroNIDAZOLE (FLAGYL) IVPB 500 mg     500 mg 100 mL/hr over 60 Minutes Intravenous  Once 08/13/16 2246 08/13/16 2357        Subjective:   [name] was seen and examined today.  Anasarca, complaining of abdominal pain 8/10, diffuse, LE edema to the thighs. Low-grade fever 100.32F.  [name] denies dizziness, chest pain, shortness of breath, N/V/D/C, new weakness, numbess, tingling. No acute events overnight.    Objective:   Vitals:   08/14/16 0000 08/14/16 0048 08/14/16 0123 08/14/16 0328  BP: (!) 136/91  131/74 94/62  Pulse: (!) 105  (!) 101 83  Resp: (!) 25  14 14   Temp:  100 F (37.8 C) (!) 100.7 F (38.2 C) 98.4 F (36.9 C)  TempSrc:  Oral  Oral  SpO2: 97%  95% 94%  Weight:      Height:        Intake/Output Summary (Last 24 hours) at 08/14/16 1126 Last data filed at 08/13/16 2357  Gross per 24 hour  Intake              150 ml  Output                0 ml  Net              150 ml     Wt Readings from Last 3 Encounters:  08/13/16 111.1 kg (245 lb)  07/09/16 106.6 kg (235 lb)  03/06/16 98.2 kg (216 lb 9.6 oz)     Exam  General: Alert and oriented x 3, NAD  Eyes: PERRLA, EOMI  HEENT:    Cardiovascular: S1 S2 auscultated, no rubs, murmurs or gallops. Regular rate and rhythm.  Respiratory: Clear to auscultation bilaterally, no wheezing, rales or rhonchi  Gastrointestinal: Anasarca,  distended, with  umbilical hernia,  Ext:  2+ pitting edema to the thighs  Neuro: no new deficits  Musculoskeletal: No digital cyanosis, clubbing  Skin: No rashes  Psych: Normal affect and demeanor, alert and oriented x3    Data Reviewed:  I have personally reviewed following labs and imaging studies  Micro Results No results found for this or any previous visit (from the past 240 hour(s)).  Radiology Reports Dg Chest 2 View  Result Date: 08/13/2016 CLINICAL DATA:  Tachycardia and fever. Meets criteria for sepsis. Shortness of breath. EXAM: CHEST  2 VIEW COMPARISON:  Most recent chest radiograph 08/04/2016 FINDINGS: Unchanged heart size and mediastinal contours. Low lung volumes. Chronic elevation of right hemidiaphragm with adjacent atelectasis or scarring. No consolidation, pleural fluid or pneumothorax. No pulmonary edema. No acute osseous abnormality. IMPRESSION: No acute abnormality. Chronic elevation of right hemidiaphragm with adjacent atelectasis/scarring. Electronically Signed   By: Jeb Levering M.D.   On: 08/13/2016 22:13   Dg Chest 2 View  Result Date: 08/04/2016 CLINICAL DATA:  Bilateral leg swelling, pain, shortness of breath, cough EXAM: CHEST  2 VIEW COMPARISON:  07/20/2016 FINDINGS: Mild elevation of the right hemidiaphragm, stable. Bibasilar atelectasis. Heart is upper limits normal in size. No effusions or acute bony abnormality. IMPRESSION: Bibasilar atelectasis. Electronically Signed   By: Rolm Baptise M.D.   On: 08/04/2016 18:03   Dg Chest 2 View  Result Date: 07/20/2016 CLINICAL DATA:  Shortness of breath and fluid and swelling in the legs EXAM: CHEST  2 VIEW COMPARISON:  07/09/2016 FINDINGS: Elevation of the right diaphragm. No acute infiltrate or effusion. Stable cardiomediastinal silhouette. No pneumothorax. IMPRESSION: No edema or infiltrate. Electronically Signed   By: Donavan Foil M.D.   On: 07/20/2016 02:23    Lab Data:  CBC:  Recent Labs Lab 08/13/16 2144  08/14/16 0404  WBC 6.9 7.3  NEUTROABS 4.4  --   HGB 10.0* 7.7*  HCT 29.5* 23.7*  MCV 79.1 78.0  PLT 67* 50*   Basic Metabolic Panel:  Recent Labs Lab 08/13/16 2144 08/14/16 0404  NA 134* 137  K 3.5 3.6  CL 103 107  CO2 23 25  GLUCOSE 148* 115*  BUN 7 8  CREATININE 1.02 1.01  CALCIUM 7.9* 7.3*   GFR: Estimated Creatinine Clearance: 93.6 mL/min (by C-G formula based on SCr of 1.01 mg/dL). Liver Function Tests:  Recent Labs Lab 08/13/16 2144  AST 50*  ALT 23  ALKPHOS 94  BILITOT 2.5*  PROT 6.2*  ALBUMIN 2.1*   No results for input(s): LIPASE, AMYLASE in the last 168 hours. No results for input(s): AMMONIA in the last 168 hours. Coagulation Profile:  Recent Labs Lab 08/13/16 2144  INR 2.10   Cardiac Enzymes: No results for input(s): CKTOTAL, CKMB, CKMBINDEX, TROPONINI in the last 168 hours. BNP (last 3 results) No results for input(s): PROBNP in the last 8760 hours. HbA1C: No results for input(s): HGBA1C in the last 72 hours. CBG: No results for input(s): GLUCAP in the last 168 hours. Lipid Profile: No results for input(s): CHOL, HDL, LDLCALC, TRIG, CHOLHDL, LDLDIRECT in the last 72 hours. Thyroid Function Tests: No results for input(s): TSH, T4TOTAL, FREET4, T3FREE, THYROIDAB in the last 72 hours. Anemia Panel: No results for input(s): VITAMINB12, FOLATE, FERRITIN, TIBC, IRON, RETICCTPCT in the last 72 hours. Urine analysis:    Component Value Date/Time   COLORURINE YELLOW 08/13/2016 2212   APPEARANCEUR CLEAR 08/13/2016 2212   LABSPEC 1.015 08/13/2016 2212   PHURINE 5.0  08/13/2016 2212   GLUCOSEU NEGATIVE 08/13/2016 2212   HGBUR NEGATIVE 08/13/2016 2212   BILIRUBINUR NEGATIVE 08/13/2016 2212   KETONESUR NEGATIVE 08/13/2016 2212   PROTEINUR NEGATIVE 08/13/2016 2212   UROBILINOGEN 1.0 12/07/2014 1042   NITRITE NEGATIVE 08/13/2016 2212   LEUKOCYTESUR NEGATIVE 08/13/2016 2212     Chanay Nugent M.D. [facility] 08/14/2016, 11:26  AM  Pager: [phone] Between 7am to 7pm - call Pager - [phone]  After 7pm go to [email or web address] - password [email or web address]  Call night coverage person covering after 7pm

## 2016-08-14 NOTE — ED Notes (Signed)
Attempted report at this time.  Requested that I call back in ten minutes.

## 2016-08-14 NOTE — Progress Notes (Signed)
Pharmacy Antibiotic Note  Friend Dorfman is a 66 y.o. male admitted on 08/13/2016 with sepsis.  Pharmacy has been consulted for Vancomycin/Ceftriaxone dosing. ?intra-abdominal source. WBC WNL. Renal function good.   Plan: Vancomycin 1250 mg IV q12h Ceftriaxone 2g IV q24h Flagyl per MD Trend WBC, temp, renal function  F/U infectious work-up Drug levels as indicated   Height: 5\' 11"  (180.3 cm) Weight: 245 lb (111.1 kg) IBW/kg (Calculated) : 75.3  Temp (24hrs), Avg:100.7 F (38.2 C), Min:99.1 F (37.3 C), Max:102.9 F (39.4 C)   Recent Labs Lab 08/13/16 2144 08/13/16 2154 08/14/16 0040  WBC 6.9  --   --   CREATININE 1.02  --   --   LATICACIDVEN  --  3.23* 3.00*    Estimated Creatinine Clearance: 92.7 mL/min (by C-G formula based on SCr of 1.02 mg/dL).    No Known Allergies   Narda Bonds 08/14/2016 1:19 AM

## 2016-08-14 NOTE — Procedures (Signed)
PROCEDURE SUMMARY:  Successful US guided paracentesis from right lateral abdomen.  Yielded 3.9 liters of cloudy yellow fluid.  No immediate complications.  Pt tolerated well.   Specimen was sent for labs.  Barton Want S Jenaro Souder PA-C 08/14/2016 12:04 PM

## 2016-08-14 NOTE — Care Management Note (Signed)
Case Management Note  Patient Details  Name: [name] MRN: [id number] Date of Birth: 05-02-1950  Subjective/Objective:                    Action/Plan: CM consulted for PCP. Pt has been active in February with [facility]. CM met with the patient and he was interested in continuing at [facility]. CM called and was able to get him an appointment for Aug 15th. Information on the AVS. Pt also reminded about using the [facility] pharmacy for his medications.  CM following for further d/c needs.   Expected Discharge Date:                  Expected Discharge Plan:  Home/Self Care  In-House Referral:     Discharge planning Services  CM Consult  Post Acute Care Choice:    Choice offered to:     DME Arranged:    DME Agency:     HH Arranged:    HH Agency:     Status of Service:  In process, will continue to follow  If discussed at Long Length of Stay Meetings, dates discussed:    Additional Comments:  Pollie Friar, RN 08/14/2016, 4:25 PM

## 2016-08-14 NOTE — H&P (Signed)
History and Physical    [name] HQP:[id number] DOB: [birth year] DOA: 08/13/2016  PCP: Patient, No Pcp Per - last saw a doctor in 2/18 Consultants:  None Patient coming from: Home - lives with girlfriend; NOK: girlfriend, [name] - [phone]  Chief Complaint: fever  HPI: [name] is a 66 y.o. male with medical history significant of alcohol dependence in remission; DM; HTN; and alcoholic cirrhosis presenting with fever.  He reports that swelling started in his feet about April.  When he laid down it would go down but would worsen if he stood on them.  It moved up into his legs and then into his stomach.  It has been this bad since about May.  He had to get bigger clothes and shoes.  His usual weight was 200, currently 245.  For the last few days he has been feeling bad.  Developed cold chills this afternoon about 6pm.  Paramedics said he had a fever.  +cough for about a month, off and on.  Nonproductive cough.  +abdominal pain and leg pain chronically, no acute changes.  No urinary symptoms.  No sick contacts.   He hit his left lateral lower leg about 3 days ago and has been carefully tending to it; he does not think it is infected.  He also noticed penile swelling yesterday with difficulty retracting his uncircumcised foreskin.  Recent ER visits include: 7/13, 6/28, 6/17, 6/10 - each of these visits were associated with LE edema, thought to be due to his liver disease, discharged with an increased dose of Lasix and/or pain medicine and he was encouraged to f/u with PCP.  He has not seen a doctor as an outpatient since February, however.   ED Course: Sepsis order set utilized; given Vanc, Flagyl and Rocephin for undifferentiated sepsis in order to also cover SBP (appears unlikely with minimal ascites seen but unable to perform paracentesis with scant fluid and also low platelets and elevated INR).  Review of Systems: As per HPI; otherwise review of systems reviewed and negative.    Ambulatory Status:  Ambulates with a cane  Past Medical History:  Diagnosis Date  . Abdominal hernia   . Arthritis    "hands" (02/16/2016)  . Bright red rectal bleeding    "@ least 1-2 times/month; sometimes more" (02/16/2016)  . Cirrhosis ([place])   . Hypertension   . Type II diabetes mellitus ([place])     Past Surgical History:  Procedure Laterality Date  . COLONOSCOPY N/A 02/17/2016   Procedure: COLONOSCOPY;  Surgeon: [name], MD;  [facility];  Service: Endoscopy;  Laterality: N/A;  . ESOPHAGOGASTRODUODENOSCOPY N/A 02/17/2016   Procedure: ESOPHAGOGASTRODUODENOSCOPY (EGD);  Surgeon: [name], MD;  Location: [facility] ENDOSCOPY;  Service: Endoscopy;  Laterality: N/A;  . FRACTURE SURGERY    . [place]   "has steel rods in it"    Social History   Social History  . Marital status: Single    Spouse name: N/A  . Number of children: N/A  . Years of education: N/A   Occupational History  . 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 Smoking status: Former Smoker    Packs/day: 0.50    Years: 38.00    Types: Cigarettes    Quit date: 2009  . Smokeless tobacco: Never Used  . Alcohol use No     Comment: Quit in 4/18  . Drug use: No  . Sexual activity: Not Currently  Other Topics Concern  . Not on file   Social History Narrative  . No narrative on file    No Known Allergies  Family History  Problem Relation Age of Onset  . Hypertension Other     Prior to Admission medications   Medication Sig Start Date End Date Taking? Authorizing Provider  atorvastatin (LIPITOR) 80 MG tablet Take 80 mg by mouth daily. 06/27/16  Yes [provider]  carvedilol (COREG) 6.25 MG tablet Take 6.25 mg by mouth 2 (two) times daily. 06/27/16  Yes [provider]  ferrous sulfate 325 (65 FE) MG tablet Take 325 mg by mouth daily. 06/28/16  Yes [provider]  furosemide (LASIX) 40 MG tablet Take 1 tablet (40  mg total) by mouth daily. 08/04/16  Yes Delo, Nathaneil Canary, MD  losartan (COZAAR) 50 MG tablet Take 50 mg by mouth daily. 06/28/16  Yes [provider]  Multiple Vitamin (MULTIVITAMIN WITH MINERALS) TABS tablet Take 1 tablet by mouth daily. 02/18/16  Yes [name], MD  oxyCODONE-acetaminophen (PERCOCET/ROXICET) 5-325 MG tablet Take 1 tablet by mouth every 8 (eight) hours as needed for severe pain. 02/18/16  Yes [name], MD  POTASSIUM PO Take 1 tablet by mouth every morning.   Yes [provider]    Physical Exam: Vitals:   08/13/16 2200 08/13/16 2300 08/14/16 0000 08/14/16 0048  BP: (!) 163/99 (!) 142/100 (!) 136/91   Pulse: (!) 114 (!) 108 (!) 105   Resp: (!) 25 (!) 21 (!) 25   Temp:    100 F (37.8 C)  TempSrc:    Oral  SpO2: 99% 96% 97%   Weight:      Height:         General:  Appears calm and comfortable and is NAD Eyes:  PERRL, EOMI, normal lids, iris ENT:  grossly normal hearing, lips & tongue, mmm Neck:  no LAD, masses or thyromegaly Cardiovascular:  Tachycardia, no m/r/g. Respiratory:  CTA bilaterally, no w/r/r. Normal respiratory effort. Abdomen: Diffuse tense ascites with apparent specific area of TTP Skin:  no rash or induration seen on limited exam; small clean-appearing abrasion along left lateral lower leg without surrounding erythema GU: uncircumcised penis with no apparent erythema or edema (chaperone present) Musculoskeletal:  grossly normal tone BUE/BLE, good ROM, no bony abnormality.  Tense LE edema extending from feet to abdomen. Psychiatric:  grossly normal mood and affect, speech fluent and appropriate, AOx3 Neurologic:  CN 2-12 grossly intact, moves all extremities in coordinated fashion, sensation intact  Labs on Admission: I have personally reviewed following labs and imaging studies  CBC:  Recent Labs Lab 08/13/16 2144  WBC 6.9  NEUTROABS 4.4  HGB 10.0*  HCT 29.5*  MCV 79.1  PLT 67*   Basic Metabolic Panel:  Recent  Labs Lab 08/13/16 2144  NA 134*  K 3.5  CL 103  CO2 23  GLUCOSE 148*  BUN 7  CREATININE 1.02  CALCIUM 7.9*   GFR: Estimated Creatinine Clearance: 92.7 mL/min (by C-G formula based on SCr of 1.02 mg/dL). Liver Function Tests:  Recent Labs Lab 08/13/16 2144  AST 50*  ALT 23  ALKPHOS 94  BILITOT 2.5*  PROT 6.2*  ALBUMIN 2.1*   No results for input(s): LIPASE, AMYLASE in the last 168 hours. No results for input(s): AMMONIA in the last 168 hours. Coagulation Profile:  Recent Labs Lab 08/13/16 2144  INR 2.10   Cardiac Enzymes: No results for input(s): CKTOTAL, CKMB, CKMBINDEX, TROPONINI in the last  168 hours. BNP (last 3 results) No results for input(s): PROBNP in the last 8760 hours. HbA1C: No results for input(s): HGBA1C in the last 72 hours. CBG: No results for input(s): GLUCAP in the last 168 hours. Lipid Profile: No results for input(s): CHOL, HDL, LDLCALC, TRIG, CHOLHDL, LDLDIRECT in the last 72 hours. Thyroid Function Tests: No results for input(s): TSH, T4TOTAL, FREET4, T3FREE, THYROIDAB in the last 72 hours. Anemia Panel: No results for input(s): VITAMINB12, FOLATE, FERRITIN, TIBC, IRON, RETICCTPCT in the last 72 hours. Urine analysis:    Component Value Date/Time   COLORURINE YELLOW 08/13/2016 2212   APPEARANCEUR CLEAR 08/13/2016 2212   LABSPEC 1.015 08/13/2016 2212   PHURINE 5.0 08/13/2016 2212   GLUCOSEU NEGATIVE 08/13/2016 2212   HGBUR NEGATIVE 08/13/2016 2212   BILIRUBINUR NEGATIVE 08/13/2016 2212   KETONESUR NEGATIVE 08/13/2016 2212   PROTEINUR NEGATIVE 08/13/2016 2212   UROBILINOGEN 1.0 12/07/2014 1042   NITRITE NEGATIVE 08/13/2016 2212   LEUKOCYTESUR NEGATIVE 08/13/2016 2212    Creatinine Clearance: Estimated Creatinine Clearance: 92.7 mL/min (by C-G formula based on SCr of 1.02 mg/dL).  Sepsis Labs: @LABRCNTIP (procalcitonin:4,lacticidven:4) )No results found for this or any previous visit (from the past 240 hour(s)).   Radiological  Exams on Admission: Dg Chest 2 View  Result Date: 08/13/2016 CLINICAL DATA:  Tachycardia and fever. Meets criteria for sepsis. Shortness of breath. EXAM: CHEST  2 VIEW COMPARISON:  Most recent chest radiograph 08/04/2016 FINDINGS: Unchanged heart size and mediastinal contours. Low lung volumes. Chronic elevation of right hemidiaphragm with adjacent atelectasis or scarring. No consolidation, pleural fluid or pneumothorax. No pulmonary edema. No acute osseous abnormality. IMPRESSION: No acute abnormality. Chronic elevation of right hemidiaphragm with adjacent atelectasis/scarring. Electronically Signed   By: Jeb Levering M.D.   On: 08/13/2016 22:13    EKG: Independently reviewed.  Junctional tachcyardia with rate 123; prolonged QT interval 537; no evidence of acute ischemia  Assessment/Plan Principal Problem:   Sepsis (HCC) Active Problems:   Thrombocytopenia (HCC)   Normochromic normocytic anemia   Cirrhosis of liver (HCC)   Hypertension   Diabetes mellitus type 2 in obese (HCC)   Prolonged QT interval   Sepsis -Fever, tachycardia, tachypnea with elevated lactate to 3.23, repeat 3  -While awaiting blood cultures, this appears to be a preseptic condition. -Sepsis protocol initiated; due to his clear volume overload he did not receive the sepsis bolus -Uncertain source - negative UA and has otherwise no acute abnormal PE or lab/Xray findings -SBP is possible and so patient is given empiric Rocephin; ER unable to perform paracentesis and so will consult IR to perform this tomorrow -Blood and urine cultures pending -Will admit and continue to monitor -Treat with IV Rocephin (as above) as well as Vanc and Flagyl -Will add HIV -Will trend lactate to ensure improvement -Will check procalcitonin  Alcoholic cirrhosis -Previous hospitalization in 1/18 for lower GI bleed but also found to have portal hypertensive gastropathy and perigastric varices. -He also has hepatitis C -He reports  that he quit drinking about 90 days ago because the doctor told him he would die if he continues to drink -Albumin 2.1; 2.1 on 6/27 -Bilirubin 2.5; 2.5 on 6/27 -INR 2.1; 2/3 on 1/26 -His liver disease appears to be stable from a lab standpoint, but he reports worsening anasarca and has had multiple ER visits about this issue -He has been on Lasix 20 mg daily; will increase to 20 mg BID -Consider addition of spironolactone -He desperately needs a PCP and close outpatient  f/u -Will request case management assistance to help patient find a PCP  Hyperglycemia -Glucose 148 -A1c 4.9 on 6/3 -reported to have DM but this does not appear to be the case -Will follow inpatient without intervention  Anemia -Hgb 10; 10.1 on 7/13 -h/o varices but no report of current bleeding and Hgb appears to be stable -Will follow  Thrombocytopenia -Platelets 67; 63 on 7/13 -Likely associated with chronic cirrhosis and/or bone marrow suppression from chronic ETOH dependence -Will follow  Prolonged QT -Will attempt to avoid QT-prolonging medications -This appears to be chronic in nature based on review of recent EKGs   DVT prophylaxis:  SCDs Code Status:  Full - confirmed with patient Family Communication: None present Disposition Plan:  Home once clinically improved Consults called: IR  Admission status:  Admit - It is my clinical opinion that admission to [facility] is reasonable and necessary because this patient will require at least 2 midnights in the hospital to treat this condition based on the medical complexity of the problems presented.  Given the aforementioned information, the predictability of an adverse outcome is felt to be significant.     Karmen Bongo MD [facility]  If 7PM-7AM, please contact night-coverage [email or web address] Password [email or web address]  08/14/2016, 1:09 AM

## 2016-08-15 LAB — BASIC METABOLIC PANEL
Anion gap: 5 (ref 5–15)
BUN: 11 mg/dL (ref 6–20)
CALCIUM: 7.5 mg/dL — AB (ref 8.9–10.3)
CO2: 24 mmol/L (ref 22–32)
CREATININE: 1 mg/dL (ref 0.61–1.24)
Chloride: 107 mmol/L (ref 101–111)
GLUCOSE: 117 mg/dL — AB (ref 65–99)
Potassium: 3.7 mmol/L (ref 3.5–5.1)
Sodium: 136 mmol/L (ref 135–145)

## 2016-08-15 LAB — CBC
HEMATOCRIT: 27.2 % — AB (ref 39.0–52.0)
Hemoglobin: 8.9 g/dL — ABNORMAL LOW (ref 13.0–17.0)
MCH: 26.3 pg (ref 26.0–34.0)
MCHC: 32.7 g/dL (ref 30.0–36.0)
MCV: 80.2 fL (ref 78.0–100.0)
PLATELETS: 57 10*3/uL — AB (ref 150–400)
RBC: 3.39 MIL/uL — ABNORMAL LOW (ref 4.22–5.81)
RDW: 22 % — AB (ref 11.5–15.5)
WBC: 5.9 10*3/uL (ref 4.0–10.5)

## 2016-08-15 MED ORDER — SPIRONOLACTONE 25 MG PO TABS
100.0000 mg | ORAL_TABLET | Freq: Every day | ORAL | Status: DC
  Administered 2016-08-15 – 2016-08-16 (×2): 100 mg via ORAL
  Filled 2016-08-15 (×2): qty 4

## 2016-08-15 NOTE — Progress Notes (Signed)
[facility]                                                                              Patient Demographics  [name], is a 66 y.o. male, DOB - 03-27-1950, JIR:[id number]  Admit date - 08/13/2016   Admitting Physician Karmen Bongo, MD  Outpatient Primary MD for the patient is Patient, No Pcp Per  Outpatient specialists:   LOS - 1  days   Medical records reviewed and are as summarized below:    Chief Complaint  Patient presents with  . Cough  . Fever  . Shortness of Breath       Brief summary   Patient is a 66 year old male with alcohol dependence in remission for last 90 days, diabetes, hypertension, and chronic cirrhosis presented with fever, anasarca, worsening since April. Patient reports that his usual weight. 100, currently 245. He also reports subjective fever and chills, coughing for about a month off and on, abdominal pain, leg pain. Has not seen a doctor as an outpatient since 2/18, Recent ER visits include: 7/13, 6/28, 6/17, 6/10 - each of these visits were associated with LE edema, thought to be due to his liver disease, discharged with an increased dose of Lasix and/or pain medicine and he was encouraged to f/u with PCP.   Assessment & Plan    Principal Problem:   Sepsis ([place]) with underlying alcoholic cirrhosis, r/o SBP - blood cultures negative so far - Patient reports that he never had paracentesis in the past  - IR was consulted, removed 3.9 L of cloudy yellow fluid, cultures negative so far - Narrowed IV vancomycin, Flagyl and Rocephin to only Rocephin until final cultures available. No fevers this morning, clinically improving.      Active Problems:   Cirrhosis of liver (HCC) likely due to alcohol - Previous hospitalization in 1/18 for lower GI bleed but also found to have portal hypertensive gastropathy and perigastric varices. History of hepatitis C. Patient reports that he has quit drinking about 90 days ago. - [facility] Jarold Song and since then he has been noncompliant. He will need GI referral. He reports that he has applied for Medicaid. - Placed and titrated up Aldactone to 100 mg daily, continue Lasix 40 mg daily, so far tolerating with the BP - He needs to follow-up with [employer] and GI outpatient    Normocytic anemia with Thrombocytopenia (HCC) - Hemoglobin 8.9 this morning (7.7 on 7/23 but repeated again was 9.6, no transfusion needed) - He does have chronic cirrhosis and bone marrow suppression     Hypertension - BP stable, tolerating the Aldactone and Lasix, - Probably does not need Coreg and losartan    Prolonged QT interval -Continue to monitor closely  Code Status: Full CODE STATUS DVT Prophylaxis:  SCD's Family Communication: Discussed in detail with the patient, all imaging results, lab results explained to the patient   Disposition Plan: Possibly DC home tomorrow if remains stable  Time Spent in minutes   25 minutes  Procedures:    Consultants:   Interventional radiology  for paracentesis  Antimicrobials:   IV vancomycin 7/22> 7/24  IV Rocephin 7/22  IV Flagyl 7/22 >7/24   Medications  Scheduled Meds: . atorvastatin  80 mg Oral QHS  . docusate sodium  100 mg Oral BID  . ferrous sulfate  325 mg Oral Daily  . furosemide  40 mg Oral Daily  . multivitamin with minerals  1 tablet Oral Daily  . spironolactone  100 mg Oral Daily   Continuous Infusions: . cefTRIAXone (ROCEPHIN)  IV Stopped (08/14/16 2143)   PRN Meds:.acetaminophen **OR** acetaminophen, HYDROmorphone (DILAUDID) injection, lidocaine, oxyCODONE-acetaminophen   Antibiotics   Anti-infectives    Start     Dose/Rate Route Frequency Ordered Stop   08/14/16 2200  cefTRIAXone (ROCEPHIN) 2 g in dextrose 5 % 50 mL IVPB     2 g 100 mL/hr over 30 Minutes Intravenous Every 24 hours 08/14/16 0123     08/14/16 1000  vancomycin (VANCOCIN) 1,250 mg  in sodium chloride 0.9 % 250 mL IVPB  Status:  Discontinued     1,250 mg 166.7 mL/hr over 90 Minutes Intravenous Every 12 hours 08/14/16 0123 08/15/16 0742   08/14/16 0115  metroNIDAZOLE (FLAGYL) IVPB 500 mg  Status:  Discontinued     500 mg 100 mL/hr over 60 Minutes Intravenous Every 8 hours 08/14/16 0114 08/15/16 0742   08/13/16 2345  vancomycin (VANCOCIN) 1,500 mg in sodium chloride 0.9 % 500 mL IVPB     1,500 mg 250 mL/hr over 120 Minutes Intravenous  Once 08/13/16 2341 08/14/16 0157   08/13/16 2300  cefTRIAXone (ROCEPHIN) 2 g in dextrose 5 % 50 mL IVPB     2 g 100 mL/hr over 30 Minutes Intravenous  Once 08/13/16 2246 08/13/16 2327   08/13/16 2300  metroNIDAZOLE (FLAGYL) IVPB 500 mg     500 mg 100 mL/hr over 60 Minutes Intravenous  Once 08/13/16 2246 08/13/16 2357        Subjective:   [name] was seen and examined today. Feels better today, no fevers, no abdominal pain, no nausea or vomiting. Still has significant edema, tolerating diuretics. Patient denies dizziness, chest pain, shortness of breath, N/V/D/C, new weakness, numbess, tingling. No acute events overnight.    Objective:   Vitals:   08/14/16 1400 08/14/16 1600 08/14/16 2157 08/15/16 0621  BP: 113/76  102/67 113/67  Pulse: 62  80 78  Resp: 15  18 18   Temp: (!) 96.3 F (35.7 C) 97.7 F (36.5 C) 98.1 F (36.7 C) 98.2 F (36.8 C)  TempSrc: Oral  Oral Oral  SpO2: 95%  92% 97%  Weight:      Height:        Intake/Output Summary (Last 24 hours) at 08/15/16 1344 Last data filed at 08/15/16 0926  Gross per 24 hour  Intake             1390 ml  Output              350 ml  Net             1040 ml     Wt Readings from Last 3 Encounters:  08/13/16 111.1 kg (245 lb)  07/09/16 106.6 kg (235 lb)  03/06/16 98.2 kg (216 lb 9.6 oz)     Exam Physical Exam  General: Alert and oriented x 3, NAD  Eyes:   HEENT:    Cardiovascular: S1 S2 auscultated, no rubs, murmurs or gallops. Regular rate and  rhythm.  Respiratory: Clear to auscultation bilaterally, no wheezing,  rales or rhonchi  Gastrointestinal: Soft, distended, umbilical hernia, normal bowel sounds  Ext: 2+ pedal edema with anasarca  Neuro: AAOx3, Cr N's II- XII. Strength 5/5 upper and lower extremities bilaterally, speech clear, sensations grossly intact  Musculoskeletal: No digital cyanosis, clubbing  Skin: Abrasion on the left lower extremity  Psych: Normal affect and demeanor, alert and oriented x3    Data Reviewed:  I have personally reviewed following labs and imaging studies  Micro Results Recent Results (from the past 240 hour(s))  Culture, blood (Routine x 2)     Status: None (Preliminary result)   Collection Time: 08/13/16  9:44 PM  Result Value Ref Range Status   Specimen Description BLOOD RIGHT ARM  Final   Special Requests   Final    BOTTLES DRAWN AEROBIC AND ANAEROBIC Blood Culture adequate volume   Culture NO GROWTH < 24 HOURS  Final   Report Status PENDING  Incomplete  Culture, blood (Routine x 2)     Status: None (Preliminary result)   Collection Time: 08/13/16  9:50 PM  Result Value Ref Range Status   Specimen Description BLOOD LEFT ARM  Final   Special Requests   Final    BOTTLES DRAWN AEROBIC AND ANAEROBIC Blood Culture adequate volume   Culture NO GROWTH < 24 HOURS  Final   Report Status PENDING  Incomplete  Gram stain     Status: None   Collection Time: 08/14/16 11:49 AM  Result Value Ref Range Status   Specimen Description PERITONEAL  Final   Special Requests NONE  Final   Gram Stain   Final    FEW WBC PRESENT,BOTH PMN AND MONONUCLEAR NO ORGANISMS SEEN    Report Status 08/14/2016 FINAL  Final    Radiology Reports Dg Chest 2 View  Result Date: 08/13/2016 CLINICAL DATA:  Tachycardia and fever. Meets criteria for sepsis. Shortness of breath. EXAM: CHEST  2 VIEW COMPARISON:  Most recent chest radiograph 08/04/2016 FINDINGS: Unchanged heart size and mediastinal contours. Low lung  volumes. Chronic elevation of right hemidiaphragm with adjacent atelectasis or scarring. No consolidation, pleural fluid or pneumothorax. No pulmonary edema. No acute osseous abnormality. IMPRESSION: No acute abnormality. Chronic elevation of right hemidiaphragm with adjacent atelectasis/scarring. Electronically Signed   By: Jeb Levering M.D.   On: 08/13/2016 22:13   Dg Chest 2 View  Result Date: 08/04/2016 CLINICAL DATA:  Bilateral leg swelling, pain, shortness of breath, cough EXAM: CHEST  2 VIEW COMPARISON:  07/20/2016 FINDINGS: Mild elevation of the right hemidiaphragm, stable. Bibasilar atelectasis. Heart is upper limits normal in size. No effusions or acute bony abnormality. IMPRESSION: Bibasilar atelectasis. Electronically Signed   By: Rolm Baptise M.D.   On: 08/04/2016 18:03   Dg Chest 2 View  Result Date: 07/20/2016 CLINICAL DATA:  Shortness of breath and fluid and swelling in the legs EXAM: CHEST  2 VIEW COMPARISON:  07/09/2016 FINDINGS: Elevation of the right diaphragm. No acute infiltrate or effusion. Stable cardiomediastinal silhouette. No pneumothorax. IMPRESSION: No edema or infiltrate. Electronically Signed   By: Donavan Foil M.D.   On: 07/20/2016 02:23   Ir Paracentesis  Result Date: 08/14/2016 INDICATION: Ascites secondary to alcoholic cirrhosis. Request for diagnostic and therapeutic paracentesis. EXAM: ULTRASOUND GUIDED RIGHT LATERAL ABDOMEN PARACENTESIS MEDICATIONS: 1% Lidocaine = 10 mL. COMPLICATIONS: None immediate. PROCEDURE: Informed written consent was obtained from the patient after a discussion of the risks, benefits and alternatives to treatment. A timeout was performed prior to the initiation of the procedure. Initial ultrasound scanning  demonstrates a large amount of ascites within the right lateral abdomen. The right lateral abdomen was prepped and draped in the usual sterile fashion. 1% lidocaine with epinephrine was used for local anesthesia. Following this, a 19  gauge, 7-cm, Yueh catheter was introduced. An ultrasound image was saved for documentation purposes. The paracentesis was performed. The catheter was removed and a dressing was applied. The patient tolerated the procedure well without immediate post procedural complication. FINDINGS: A total of approximately 3.9 liters of cloudy yellow fluid was removed. Samples were sent to the laboratory as requested by the clinical team. IMPRESSION: Successful ultrasound-guided paracentesis yielding 3.9 liters of peritoneal fluid. Read by:  Gareth Eagle, PA-C Electronically Signed   By: Jerilynn Mages.  Shick M.D.   On: 08/14/2016 13:35    Lab Data:  CBC:  Recent Labs Lab 08/13/16 2144 08/14/16 0404 08/14/16 1319 08/15/16 0348  WBC 6.9 7.3  --  5.9  NEUTROABS 4.4  --   --   --   HGB 10.0* 7.7* 9.6* 8.9*  HCT 29.5* 23.7* 29.5* 27.2*  MCV 79.1 78.0  --  80.2  PLT 67* 50*  --  57*   Basic Metabolic Panel:  Recent Labs Lab 08/13/16 2144 08/14/16 0404 08/15/16 0348  NA 134* 137 136  K 3.5 3.6 3.7  CL 103 107 107  CO2 23 25 24   GLUCOSE 148* 115* 117*  BUN 7 8 11   CREATININE 1.02 1.01 1.00  CALCIUM 7.9* 7.3* 7.5*   GFR: Estimated Creatinine Clearance: 94.6 mL/min (by C-G formula based on SCr of 1 mg/dL). Liver Function Tests:  Recent Labs Lab 08/13/16 2144 08/14/16 1319  AST 50* 49*  ALT 23 17  ALKPHOS 94 80  BILITOT 2.5* 3.6*  PROT 6.2* 6.2*  ALBUMIN 2.1* 2.0*   No results for input(s): LIPASE, AMYLASE in the last 168 hours. No results for input(s): AMMONIA in the last 168 hours. Coagulation Profile:  Recent Labs Lab 08/13/16 2144  INR 2.10   Cardiac Enzymes: No results for input(s): CKTOTAL, CKMB, CKMBINDEX, TROPONINI in the last 168 hours. BNP (last 3 results) No results for input(s): PROBNP in the last 8760 hours. HbA1C: No results for input(s): HGBA1C in the last 72 hours. CBG: No results for input(s): GLUCAP in the last 168 hours. Lipid Profile: No results for input(s): CHOL,  HDL, LDLCALC, TRIG, CHOLHDL, LDLDIRECT in the last 72 hours. Thyroid Function Tests: No results for input(s): TSH, T4TOTAL, FREET4, T3FREE, THYROIDAB in the last 72 hours. Anemia Panel:  Recent Labs  08/14/16 1319  VITAMINB12 1,988*  FOLATE 14.9  FERRITIN 32  TIBC 272  IRON 168  RETICCTPCT 2.8   Urine analysis:    Component Value Date/Time   COLORURINE YELLOW 08/13/2016 2212   APPEARANCEUR CLEAR 08/13/2016 2212   LABSPEC 1.015 08/13/2016 2212   PHURINE 5.0 08/13/2016 2212   GLUCOSEU NEGATIVE 08/13/2016 2212   HGBUR NEGATIVE 08/13/2016 2212   BILIRUBINUR NEGATIVE 08/13/2016 2212   KETONESUR NEGATIVE 08/13/2016 2212   PROTEINUR NEGATIVE 08/13/2016 2212   UROBILINOGEN 1.0 12/07/2014 1042   NITRITE NEGATIVE 08/13/2016 2212   LEUKOCYTESUR NEGATIVE 08/13/2016 2212     Ripudeep Rai M.D. [facility] 08/15/2016, 1:44 PM  Pager: [phone] Between 7am to 7pm - call Pager - [phone]  After 7pm go to [email or web address] - password [email or web address]  Call night coverage person covering after 7pm

## 2016-08-16 ENCOUNTER — Inpatient Hospital Stay (HOSPITAL_COMMUNITY): Payer: Medicaid Other

## 2016-08-16 DIAGNOSIS — E669 Obesity, unspecified: Secondary | ICD-10-CM

## 2016-08-16 DIAGNOSIS — K7031 Alcoholic cirrhosis of liver with ascites: Principal | ICD-10-CM

## 2016-08-16 DIAGNOSIS — N5089 Other specified disorders of the male genital organs: Secondary | ICD-10-CM

## 2016-08-16 DIAGNOSIS — R9431 Abnormal electrocardiogram [ECG] [EKG]: Secondary | ICD-10-CM

## 2016-08-16 DIAGNOSIS — E1169 Type 2 diabetes mellitus with other specified complication: Secondary | ICD-10-CM

## 2016-08-16 DIAGNOSIS — D696 Thrombocytopenia, unspecified: Secondary | ICD-10-CM

## 2016-08-16 DIAGNOSIS — R6510 Systemic inflammatory response syndrome (SIRS) of non-infectious origin without acute organ dysfunction: Secondary | ICD-10-CM

## 2016-08-16 LAB — CBC
HEMATOCRIT: 30.3 % — AB (ref 39.0–52.0)
HEMOGLOBIN: 9.6 g/dL — AB (ref 13.0–17.0)
MCH: 25.2 pg — AB (ref 26.0–34.0)
MCHC: 31.7 g/dL (ref 30.0–36.0)
MCV: 79.5 fL (ref 78.0–100.0)
Platelets: 73 10*3/uL — ABNORMAL LOW (ref 150–400)
RBC: 3.81 MIL/uL — AB (ref 4.22–5.81)
RDW: 21.7 % — ABNORMAL HIGH (ref 11.5–15.5)
WBC: 6.2 10*3/uL (ref 4.0–10.5)

## 2016-08-16 LAB — TOTAL BILIRUBIN, BODY FLUID: TOTBILIFLUID: 0.3 mg/dL

## 2016-08-16 LAB — TRIGLYCERIDES, BODY FLUIDS: Triglycerides, Fluid: 54 mg/dL

## 2016-08-16 LAB — BASIC METABOLIC PANEL
ANION GAP: 5 (ref 5–15)
BUN: 10 mg/dL (ref 6–20)
CO2: 24 mmol/L (ref 22–32)
Calcium: 7.9 mg/dL — ABNORMAL LOW (ref 8.9–10.3)
Chloride: 105 mmol/L (ref 101–111)
Creatinine, Ser: 1 mg/dL (ref 0.61–1.24)
GFR calc Af Amer: >60 mL/min (ref >60)
GLUCOSE: 103 mg/dL — AB (ref 65–99)
POTASSIUM: 3.6 mmol/L (ref 3.5–5.1)
SODIUM: 134 mmol/L — AB (ref 135–145)

## 2016-08-16 LAB — HIV ANTIBODY (ROUTINE TESTING W REFLEX): HIV Screen 4th Generation wRfx: NONREACTIVE

## 2016-08-16 LAB — PH, BODY FLUID: pH, Body Fluid: 8

## 2016-08-16 MED ORDER — FUROSEMIDE 40 MG PO TABS
40.0000 mg | ORAL_TABLET | Freq: Two times a day (BID) | ORAL | Status: DC
  Filled 2016-08-16: qty 1

## 2016-08-16 MED ORDER — MAGNESIUM CITRATE PO SOLN
0.5000 | Freq: Once | ORAL | Status: AC
  Administered 2016-08-16: 0.5 via ORAL
  Filled 2016-08-16: qty 296

## 2016-08-16 MED ORDER — SPIRONOLACTONE 100 MG PO TABS: 100.0000 mg | ORAL_TABLET | Freq: Every day | ORAL | 0 refills | Status: DC

## 2016-08-16 NOTE — Progress Notes (Signed)
Nsg Discharge Note  Admit Date:  08/13/2016 Discharge date: 08/16/2016   [name] to be D/C'd Home per MD order.  AVS completed.  Copy for chart, and copy for patient signed, and dated. Patient/caregiver able to verbalize understanding.  Discharge Medication: Allergies as of 08/16/2016   No Known Allergies     Medication List    STOP taking these medications   losartan 50 MG tablet Commonly known as:  COZAAR   POTASSIUM PO     TAKE these medications   atorvastatin 80 MG tablet Commonly known as:  LIPITOR Take 80 mg by mouth daily.   carvedilol 6.25 MG tablet Commonly known as:  COREG Take 6.25 mg by mouth 2 (two) times daily.   ferrous sulfate 325 (65 FE) MG tablet Take 325 mg by mouth daily.   furosemide 40 MG tablet Commonly known as:  LASIX Take 1 tablet (40 mg total) by mouth daily.   multivitamin with minerals Tabs tablet Take 1 tablet by mouth daily.   oxyCODONE-acetaminophen 5-325 MG tablet Commonly known as:  PERCOCET/ROXICET Take 1 tablet by mouth every 8 (eight) hours as needed for severe pain.   spironolactone 100 MG tablet Commonly known as:  ALDACTONE Take 1 tablet (100 mg total) by mouth daily.       Discharge Assessment: Vitals:   08/16/16 0419 08/16/16 1520  BP: 137/76 137/81  Pulse: 90 90  Resp: 18 18  Temp: 98.2 F (36.8 C) 98 F (36.7 C)   Skin clean, dry and intact without evidence of skin break down, no evidence of skin tears noted. IV catheter discontinued intact. Site without signs and symptoms of complications - no redness or edema noted at insertion site, patient denies c/o pain - only slight tenderness at site.  Dressing with slight pressure applied.  D/c Instructions-Education: Discharge instructions given to patient/family with verbalized understanding. D/c education completed with patient/family including follow up instructions, medication list, d/c activities limitations if indicated, with other d/c instructions as  indicated by MD - patient able to verbalize understanding, all questions fully answered. Patient instructed to return to ED, call 911, or call MD for any changes in condition.  Patient escorted via [place], and D/C home via private auto.  Salley Slaughter, RN 08/16/2016 5:56 PM

## 2016-08-16 NOTE — Discharge Instructions (Signed)
Ascites °Ascites is a collection of excess fluid in the abdomen. Ascites can range from mild to severe. It can get worse without treatment. °What are the causes? °Possible causes include: °· Cirrhosis. This is the most common cause of ascites. °· Infection or inflammation in the abdomen. °· Cancer in the abdomen. °· Heart failure. °· Kidney disease. °· Inflammation of the pancreas. °· Clots in the veins of the liver. ° °What are the signs or symptoms? °Signs and symptoms may include: °· A feeling of fullness in your abdomen. This is common. °· An increase in the size of your abdomen or your waist. °· Swelling in your legs. °· Swelling of the scrotum in men. °· Difficulty breathing. °· Abdominal pain. °· Sudden weight gain. ° °If the condition is mild, you may not have symptoms. °How is this diagnosed? °To make a diagnosis, your health care provider will: °· Ask about your medical history. °· Perform a physical exam. °· Order imaging tests, such as an ultrasound or CT scan of your abdomen. ° °How is this treated? °Treatment depends on the cause of the ascites. It may include: °· Taking a pill to make you urinate. This is called a water pill (diuretic pill). °· Strictly reducing your salt (sodium) intake. Salt can cause extra fluid to be kept in the body, and this makes ascites worse. °· Having a procedure to remove fluid from your abdomen (paracentesis). °· Having a procedure to transfer fluid from your abdomen into a vein. °· Having a procedure that connects two of the major veins within your liver and relieves pressure on your liver (TIPS procedure). ° °Ascites may go away or improve with treatment of the condition that caused it. °Follow these instructions at home: °· Keep track of your weight. To do this, weigh yourself at the same time every day and record your weight. °· Keep track of how much you drink and any changes in the amount you urinate. °· Follow any instructions that your health care provider gives  you about how much to drink. °· Try not to eat salty (high-sodium) foods. °· Take medicines only as directed by your health care provider. °· Keep all follow-up visits as directed by your health care provider. This is important. °· Report any changes in your health to your health care provider, especially if you develop new symptoms or your symptoms get worse. °Contact a health care provider if: °· Your gain more than 3 pounds in 3 days. °· Your abdominal size or your waist size increases. °· You have new swelling in your legs. °· The swelling in your legs gets worse. °Get help right away if: °· You develop a fever. °· You develop confusion. °· You develop new or worsening difficulty breathing. °· You develop new or worsening abdominal pain. °· You develop new or worsening swelling in the scrotum (in men). °This information is not intended to replace advice given to you by your health care provider. Make sure you discuss any questions you have with your health care provider. °Document Released: 01/09/2005 Document Revised: 05/19/2015 Document Reviewed: 08/08/2013 °Elsevier Interactive Patient Education © 2018 [employer] ° °

## 2016-08-16 NOTE — Discharge Summary (Signed)
Physician Discharge Summary  [name] LOV:[id number] DOB: [birth year] DOA: 08/13/2016  PCP: Patient, No Pcp Per  Admit date: 08/13/2016 Discharge date: 08/16/2016  Admitted From: Home Disposition: Home  Recommendations for Outpatient Follow-up:  1. Follow up with PCP in 1 week 2. Please obtain BMP/CBC in one week 3. Please follow up on the following pending results: Blood cultures, body fluid cultures  Home Health: None Equipment/Devices: None  Discharge Condition: Stable CODE STATUS: Full code Diet recommendation: Heart healthy/fluid restriction 1282m   Brief/Interim Summary:  Admission HPI written by JKarmen Bongo MD   Chief Complaint: fever  HPI: Darren Bordis a 66y.o. male with medical history significant of alcohol dependence in remission; DM; HTN; and alcoholic cirrhosis presenting with fever.  He reports that swelling started in his feet about April.  When he laid down it would go down but would worsen if he stood on them.  It moved up into his legs and then into his stomach.  It has been this bad since about May.  He had to get bigger clothes and shoes.  His usual weight was 200, currently 245.  For the last few days he has been feeling bad.  Developed cold chills this afternoon about 6pm.  Paramedics said he had a fever.  +cough for about a month, off and on.  Nonproductive cough.  +abdominal pain and leg pain chronically, no acute changes.  No urinary symptoms.  No sick contacts.   He hit his left lateral lower leg about 3 days ago and has been carefully tending to it; he does not think it is infected.  He also noticed penile swelling yesterday with difficulty retracting his uncircumcised foreskin.  Recent ER visits include: 7/13, 6/28, 6/17, 6/10 - each of these visits were associated with LE edema, thought to be due to his liver disease, discharged with an increased dose of Lasix and/or pain medicine and he was encouraged to f/u with PCP.  He has not seen a doctor as  an outpatient since February, however.   ED Course: Sepsis order set utilized; given Vanc, Flagyl and Rocephin for undifferentiated sepsis in order to also cover SBP (appears unlikely with minimal ascites seen but unable to perform paracentesis with scant fluid and also low platelets and elevated INR).    Hospital course:  SIRS Patient met criteria on admission. Initially thought possible source was ascites fluid. Patient was broadly covered with IV vancomycin, Flagyl, and Rocephin. Paracentesis performed which yielded 3.9 L of cloudy yellow fluid. Gram stain was negative and cultures were also negative today. Patient was narrowed to ceftriaxone awaiting further culture results. Patient remained afebrile. Cultures negative to date and patient was discussed discharged without antibiotics.  Cirrhosis of the liver Secondary to alcohol use. He has associated ascites. He was started on Aldactone with continuation of his Lasix. He will need to have follow-up lab work to recheck potassium.  Normocytic anemia Thrombocytopenia Secondary to chronic cirrhosis and bone marrow suppression. Stable. Outpatient follow-up lab work.   Essential hypertension Continued Coreg in addition to Aldactone and Lasix. Losartan held secondary to control blood pressure in addition to starting Aldactone.  Prolonged QT interval Monitored on telemetry.   Scrotal and penile edema Secondary to anasarca. Discussed with Dr. BAlinda Money urology, who recommended continued elevation. No concern for paraphimosis. Ultrasound obtained and was significant for no torsion.    Discharge Diagnoses:  Principal Problem:   SIRS (systemic inflammatory response syndrome) (HCC) Active Problems:   Thrombocytopenia (HDonald  Normochromic normocytic anemia   Cirrhosis of liver (HCC)   Hypertension   Diabetes mellitus type 2 in obese (HCC)   Prolonged QT interval    Discharge Instructions   Allergies as of 08/16/2016   No Known  Allergies     Medication List    STOP taking these medications   losartan 50 MG tablet Commonly known as:  COZAAR   POTASSIUM PO     TAKE these medications   atorvastatin 80 MG tablet Commonly known as:  LIPITOR Take 80 mg by mouth daily.   carvedilol 6.25 MG tablet Commonly known as:  COREG Take 6.25 mg by mouth 2 (two) times daily.   ferrous sulfate 325 (65 FE) MG tablet Take 325 mg by mouth daily.   furosemide 40 MG tablet Commonly known as:  LASIX Take 1 tablet (40 mg total) by mouth daily.   multivitamin with minerals Tabs tablet Take 1 tablet by mouth daily.   oxyCODONE-acetaminophen 5-325 MG tablet Commonly known as:  PERCOCET/ROXICET Take 1 tablet by mouth every 8 (eight) hours as needed for severe pain.   spironolactone 100 MG tablet Commonly known as:  ALDACTONE Take 1 tablet (100 mg total) by mouth daily.      Follow-up [facility] Follow up on 09/06/2016.   Why:  Your appointment is at 1:30 pm. Please bring current medications.  Contact information: [address] [place] [address] [phone]       [facility], [facility]. Schedule an appointment as soon as possible for a visit in 2 week(s).   Contact information: [place] [address] [phone]          No Known Allergies  Consultations:  Interventional radiology   Procedures/Studies: Dg Chest 2 View  Result Date: 08/13/2016 CLINICAL DATA:  Tachycardia and fever. Meets criteria for sepsis. Shortness of breath. EXAM: CHEST  2 VIEW COMPARISON:  Most recent chest radiograph 08/04/2016 FINDINGS: Unchanged heart size and mediastinal contours. Low lung volumes. Chronic elevation of right hemidiaphragm with adjacent atelectasis or scarring. No consolidation, pleural fluid or pneumothorax. No pulmonary edema. No acute osseous abnormality. IMPRESSION: No acute abnormality. Chronic elevation of  right hemidiaphragm with adjacent atelectasis/scarring. Electronically Signed   By: Jeb Levering M.D.   On: 08/13/2016 22:13   Dg Chest 2 View  Result Date: 08/04/2016 CLINICAL DATA:  Bilateral leg swelling, pain, shortness of breath, cough EXAM: CHEST  2 VIEW COMPARISON:  07/20/2016 FINDINGS: Mild elevation of the right hemidiaphragm, stable. Bibasilar atelectasis. Heart is upper limits normal in size. No effusions or acute bony abnormality. IMPRESSION: Bibasilar atelectasis. Electronically Signed   By: Rolm Baptise M.D.   On: 08/04/2016 18:03   Dg Chest 2 View  Result Date: 07/20/2016 CLINICAL DATA:  Shortness of breath and fluid and swelling in the legs EXAM: CHEST  2 VIEW COMPARISON:  07/09/2016 FINDINGS: Elevation of the right diaphragm. No acute infiltrate or effusion. Stable cardiomediastinal silhouette. No pneumothorax. IMPRESSION: No edema or infiltrate. Electronically Signed   By: Donavan Foil M.D.   On: 07/20/2016 02:23   US Scrotum  Result Date: 08/16/2016 CLINICAL DATA:  Scrotal swelling and pain since this morning. History of ascites. EXAM: SCROTAL ULTRASOUND DOPPLER ULTRASOUND OF THE TESTICLES TECHNIQUE: Complete ultrasound examination of the testicles, epididymis, and other scrotal structures was performed. Color and spectral Doppler ultrasound were also utilized to evaluate blood flow to the testicles. COMPARISON:  Abdominal and pelvic  CT scan of February 15, 2016 and abdominal ultrasound of February 18, 2016. FINDINGS: Right testicle Measurements: 3.5 x 2.3 x 2.4 cm. No mass or microlithiasis visualized. Left testicle Measurements: 3.3 x 2 x 2.3 cm. No mass or microlithiasis visualized. Right epididymis: Normal in size. It measures 1.2 x 0.9 x 1.4 cm. There is an 8 mm diameter epididymal cyst. Left epididymis: Normal in size. It measures 1 x 0.7 x 1 cm. There is a 5 mm diameter simple appearing left epididymal cyst. Hydrocele:  There small bilateral hydroceles. Varicocele:  There is  a varicocele on the left. Pulsed Doppler interrogation of both testes demonstrates normal low resistance arterial and venous waveforms bilaterally. There is scrotal edema diffusely greatest on the left. IMPRESSION: Normal appearance of the testes and epididymal structures. No evidence of orchitis or epididymitis or torsion. Small bilateral hydroceles and left-sided varicocele. There is diffuse scrotal edema greatest on the left. Electronically Signed   By: David  Martinique M.D.   On: 08/16/2016 12:29   [place] Art/ven Flow Abd Pelv Doppler  Result Date: 08/16/2016 CLINICAL DATA:  Scrotal swelling and pain since this morning. History of ascites. EXAM: SCROTAL ULTRASOUND DOPPLER ULTRASOUND OF THE TESTICLES TECHNIQUE: Complete ultrasound examination of the testicles, epididymis, and other scrotal structures was performed. Color and spectral Doppler ultrasound were also utilized to evaluate blood flow to the testicles. COMPARISON:  Abdominal and pelvic CT scan of February 15, 2016 and abdominal ultrasound of February 18, 2016. FINDINGS: Right testicle Measurements: 3.5 x 2.3 x 2.4 cm. No mass or microlithiasis visualized. Left testicle Measurements: 3.3 x 2 x 2.3 cm. No mass or microlithiasis visualized. Right epididymis: Normal in size. It measures 1.2 x 0.9 x 1.4 cm. There is an 8 mm diameter epididymal cyst. Left epididymis: Normal in size. It measures 1 x 0.7 x 1 cm. There is a 5 mm diameter simple appearing left epididymal cyst. Hydrocele:  There small bilateral hydroceles. Varicocele:  There is a varicocele on the left. Pulsed Doppler interrogation of both testes demonstrates normal low resistance arterial and venous waveforms bilaterally. There is scrotal edema diffusely greatest on the left. IMPRESSION: Normal appearance of the testes and epididymal structures. No evidence of orchitis or epididymitis or torsion. Small bilateral hydroceles and left-sided varicocele. There is diffuse scrotal edema greatest on the left.  Electronically Signed   By: David  Martinique M.D.   On: 08/16/2016 12:29   Ir Paracentesis  Result Date: 08/14/2016 INDICATION: Ascites secondary to alcoholic cirrhosis. Request for diagnostic and therapeutic paracentesis. EXAM: ULTRASOUND GUIDED RIGHT LATERAL ABDOMEN PARACENTESIS MEDICATIONS: 1% Lidocaine = 10 mL. COMPLICATIONS: None immediate. PROCEDURE: Informed written consent was obtained from the patient after a discussion of the risks, benefits and alternatives to treatment. A timeout was performed prior to the initiation of the procedure. Initial ultrasound scanning demonstrates a large amount of ascites within the right lateral abdomen. The right lateral abdomen was prepped and draped in the usual sterile fashion. 1% lidocaine with epinephrine was used for local anesthesia. Following this, a 19 gauge, 7-cm, Yueh catheter was introduced. An ultrasound image was saved for documentation purposes. The paracentesis was performed. The catheter was removed and a dressing was applied. The patient tolerated the procedure well without immediate post procedural complication. FINDINGS: A total of approximately 3.9 liters of cloudy yellow fluid was removed. Samples were sent to the laboratory as requested by the clinical team. IMPRESSION: Successful ultrasound-guided paracentesis yielding 3.9 liters of peritoneal fluid. Read by:  Gareth Eagle, PA-C Electronically  Signed   By: Jerilynn Mages.  Shick M.D.   On: 08/14/2016 13:35      Subjective: His report some scrotal and penile swelling today.   Discharge Exam: Vitals:   08/16/16 0419 08/16/16 1520  BP: 137/76 137/81  Pulse: 90 90  Resp: 18 18  Temp: 98.2 F (36.8 C) 98 F (36.7 C)   Vitals:   08/15/16 2154 08/15/16 2154 08/16/16 0419 08/16/16 1520  BP: 120/74 120/74 137/76 137/81  Pulse: 85 85 90 90  Resp: _0 Temp: 98.7 F (37.1 C) 98.7 F (37.1 C) 98.2 F (36.8 C) 98 F (36.7 C)  TempSrc: Oral Oral Oral Oral  SpO2: 96% 96% 97% 97%  Weight:       Height:        General: Pt is alert, awake, not in acute distress Cardiovascular: RRR, S1/S2 +, no rubs, no gallops Respiratory: CTA bilaterally, no wheezing, no rhonchi Abdominal: Soft, NT, distended, bowel sounds + GU: edema of penile shaft and scrotum. Extremities: no edema, no cyanosis    The results of significant diagnostics from this hospitalization (including imaging, microbiology, ancillary and laboratory) are listed below for reference.     Microbiology: Recent Results (from the past 240 hour(s))  Culture, blood (Routine x 2)     Status: None (Preliminary result)   Collection Time: 08/13/16  9:44 PM  Result Value Ref Range Status   Specimen Description BLOOD RIGHT ARM  Final   Special Requests   Final    BOTTLES DRAWN AEROBIC AND ANAEROBIC Blood Culture adequate volume   Culture NO GROWTH 3 DAYS  Final   Report Status PENDING  Incomplete  Culture, blood (Routine x 2)     Status: None (Preliminary result)   Collection Time: 08/13/16  9:50 PM  Result Value Ref Range Status   Specimen Description BLOOD LEFT ARM  Final   Special Requests   Final    BOTTLES DRAWN AEROBIC AND ANAEROBIC Blood Culture adequate volume   Culture NO GROWTH 3 DAYS  Final   Report Status PENDING  Incomplete  Gram stain     Status: None   Collection Time: 08/14/16 11:49 AM  Result Value Ref Range Status   Specimen Description PERITONEAL  Final   Special Requests NONE  Final   Gram Stain   Final    FEW WBC PRESENT,BOTH PMN AND MONONUCLEAR NO ORGANISMS SEEN    Report Status 08/14/2016 FINAL  Final  Culture, body fluid-bottle     Status: None (Preliminary result)   Collection Time: 08/14/16 11:49 AM  Result Value Ref Range Status   Specimen Description PERITONEAL  Final   Special Requests NONE  Final   Culture NO GROWTH 2 DAYS  Final   Report Status PENDING  Incomplete     Labs: BNP (last 3 results)  Recent Labs  07/02/16 0211 07/20/16 0210  BNP 104.8* 98.9   Basic Metabolic  Panel:  Recent Labs Lab 08/13/16 2144 08/14/16 0404 08/15/16 0348 08/16/16 0528  NA 134* 137 136 134*  K 3.5 3.6 3.7 3.6  CL 103 107 107 105  CO2 _1 GLUCOSE 148* 115* 117* 103*  BUN _2 CREATININE 1.02 1.01 1.00 1.00  CALCIUM 7.9* 7.3* 7.5* 7.9*   Liver Function Tests:  Recent Labs Lab 08/13/16 2144 08/14/16 1319  AST 50* 49*  ALT 23 17  ALKPHOS 94 80  BILITOT 2.5* 3.6*  PROT 6.2* 6.2*  ALBUMIN 2.1*  2.0*   No results for input(s): LIPASE, AMYLASE in the last 168 hours. No results for input(s): AMMONIA in the last 168 hours. CBC:  Recent Labs Lab 08/13/16 2144 08/14/16 0404 08/14/16 1319 08/15/16 0348 08/16/16 0528  WBC 6.9 7.3  --  5.9 6.2  NEUTROABS 4.4  --   --   --   --   HGB 10.0* 7.7* 9.6* 8.9* 9.6*  HCT 29.5* 23.7* 29.5* 27.2* 30.3*  MCV 79.1 78.0  --  80.2 79.5  PLT 67* 50*  --  57* 73*   Cardiac Enzymes: No results for input(s): CKTOTAL, CKMB, CKMBINDEX, TROPONINI in the last 168 hours. BNP: Invalid input(s): POCBNP CBG: No results for input(s): GLUCAP in the last 168 hours. D-Dimer No results for input(s): DDIMER in the last 72 hours. Hgb A1c No results for input(s): HGBA1C in the last 72 hours. Lipid Profile No results for input(s): CHOL, HDL, LDLCALC, TRIG, CHOLHDL, LDLDIRECT in the last 72 hours. Thyroid function studies No results for input(s): TSH, T4TOTAL, T3FREE, THYROIDAB in the last 72 hours.  Invalid input(s): FREET3 Anemia work up  Recent Labs  08/14/16 1319  VITAMINB12 1,988*  FOLATE 14.9  FERRITIN 32  TIBC 272  IRON 168  RETICCTPCT 2.8   Urinalysis    Component Value Date/Time   COLORURINE YELLOW 08/13/2016 2212   APPEARANCEUR CLEAR 08/13/2016 2212   LABSPEC 1.015 08/13/2016 2212   PHURINE 5.0 08/13/2016 2212   GLUCOSEU NEGATIVE 08/13/2016 2212   HGBUR NEGATIVE 08/13/2016 2212   BILIRUBINUR NEGATIVE 08/13/2016 2212   KETONESUR NEGATIVE 08/13/2016 2212   PROTEINUR NEGATIVE 08/13/2016 2212    UROBILINOGEN 1.0 12/07/2014 1042   NITRITE NEGATIVE 08/13/2016 2212   LEUKOCYTESUR NEGATIVE 08/13/2016 2212   Sepsis Labs Invalid input(s): PROCALCITONIN,  WBC,  LACTICIDVEN Microbiology Recent Results (from the past 240 hour(s))  Culture, blood (Routine x 2)     Status: None (Preliminary result)   Collection Time: 08/13/16  9:44 PM  Result Value Ref Range Status   Specimen Description BLOOD RIGHT ARM  Final   Special Requests   Final    BOTTLES DRAWN AEROBIC AND ANAEROBIC Blood Culture adequate volume   Culture NO GROWTH 3 DAYS  Final   Report Status PENDING  Incomplete  Culture, blood (Routine x 2)     Status: None (Preliminary result)   Collection Time: 08/13/16  9:50 PM  Result Value Ref Range Status   Specimen Description BLOOD LEFT ARM  Final   Special Requests   Final    BOTTLES DRAWN AEROBIC AND ANAEROBIC Blood Culture adequate volume   Culture NO GROWTH 3 DAYS  Final   Report Status PENDING  Incomplete  Gram stain     Status: None   Collection Time: 08/14/16 11:49 AM  Result Value Ref Range Status   Specimen Description PERITONEAL  Final   Special Requests NONE  Final   Gram Stain   Final    FEW WBC PRESENT,BOTH PMN AND MONONUCLEAR NO ORGANISMS SEEN    Report Status 08/14/2016 FINAL  Final  Culture, body fluid-bottle     Status: None (Preliminary result)   Collection Time: 08/14/16 11:49 AM  Result Value Ref Range Status   Specimen Description PERITONEAL  Final   Special Requests NONE  Final   Culture NO GROWTH 2 DAYS  Final   Report Status PENDING  Incomplete     Time coordinating discharge: Over 30 minutes  SIGNED:   Cordelia Poche, MD [facility] 08/16/2016, 5:37 PM  Pager [phone]  If 7PM-7AM, please contact night-coverage [email or web address] Password [email or web address]

## 2016-08-18 ENCOUNTER — Ambulatory Visit: Payer: Self-pay | Admitting: Family Medicine

## 2016-08-18 LAB — CULTURE, BLOOD (ROUTINE X 2)
CULTURE: NO GROWTH
Culture: NO GROWTH
SPECIAL REQUESTS: ADEQUATE
SPECIAL REQUESTS: ADEQUATE

## 2016-08-19 LAB — CULTURE, BODY FLUID-BOTTLE: CULTURE: NO GROWTH

## 2016-08-21 NOTE — ED Provider Notes (Addendum)
[facility] DEPT Provider Note   CSN: [id number] Arrival date & time: 08/13/16  2125     History   Chief Complaint Chief Complaint  Patient presents with  . Cough  . Fever  . Shortness of Breath    HPI Dayton Kenley is a 66 y.o. male.  HPI  66 y.o. male with medical history significant of DM; HTN; and alcoholic cirrhosis presenting with fever, cough, dib.  Pt reports that swelling started a while back and he has had numerous ED visits for it. Pt's DIB is present when he walks, lays flat and has progressing. His usual weight was 200, currently 245.  For the last few days he has been feeling bad.  Developed cold chills this afternoon about 6pm.  Paramedics said he had a fever.  +cough for about a month, off and on.  Nonproductive cough.  +abdominal pain and leg pain chronically, no acute changes.  No urinary symptoms.  No sick contacts.     Past Medical History:  Diagnosis Date  . Abdominal hernia   . Arthritis    "hands" (02/16/2016)  . Bright red rectal bleeding    "@ least 1-2 times/month; sometimes more" (02/16/2016)  . Cirrhosis ([place])   . Hypertension   . Type II diabetes mellitus [facility])     Patient Active Problem List   Diagnosis Date Noted  . SIRS (systemic inflammatory response syndrome) ([place]) 08/14/2016  . Diabetes mellitus type 2 in obese ([place]) 08/14/2016  . Prolonged QT interval 08/14/2016  . Anxiety 03/06/2016  . Insomnia 03/06/2016  . Hypertension 03/06/2016  . Hepatitis C antibody test positive 02/18/2016  . Thrombocytopenia ([place]) 02/16/2016  . Normochromic normocytic anemia 02/16/2016  . Alcohol abuse 02/16/2016  . Cirrhosis of liver ([place]) 02/16/2016    Past Surgical History:  Procedure Laterality Date  . COLONOSCOPY N/A 02/17/2016   Procedure: COLONOSCOPY;  Surgeon: [name], MD;  Location: [facility] ENDOSCOPY;  Service: Endoscopy;  Laterality: N/A;  . ESOPHAGOGASTRODUODENOSCOPY N/A 02/17/2016   Procedure: ESOPHAGOGASTRODUODENOSCOPY (EGD);  Surgeon:  [name], MD;  Location: [facility] ENDOSCOPY;  Service: Endoscopy;  Laterality: N/A;  . FRACTURE SURGERY    . IR PARACENTESIS  08/14/2016  . [place]   "has steel rods in it"       Home Medications    Prior to Admission medications   Medication Sig Start Date End Date Taking? Authorizing Provider  atorvastatin (LIPITOR) 80 MG tablet Take 80 mg by mouth daily. 06/27/16  Yes [provider]  carvedilol (COREG) 6.25 MG tablet Take 6.25 mg by mouth 2 (two) times daily. 06/27/16  Yes [provider]  ferrous sulfate 325 (65 FE) MG tablet Take 325 mg by mouth daily. 06/28/16  Yes [provider]  furosemide (LASIX) 40 MG tablet Take 1 tablet (40 mg total) by mouth daily. 08/04/16  Yes Delo, Nathaneil Canary, MD  Multiple Vitamin (MULTIVITAMIN WITH MINERALS) TABS tablet Take 1 tablet by mouth daily. 02/18/16  Yes [name], MD  oxyCODONE-acetaminophen (PERCOCET/ROXICET) 5-325 MG tablet Take 1 tablet by mouth every 8 (eight) hours as needed for severe pain. 02/18/16  Yes [name], MD  spironolactone (ALDACTONE) 100 MG tablet Take 1 tablet (100 mg total) by mouth daily. 08/17/16   Mariel Aloe, MD    Family History Family History  Problem Relation Age of Onset  . Hypertension Other     Social History Social History  Substance Use Topics  . Smoking status: Former Smoker  Packs/day: 0.50    Years: 38.00    Types: Cigarettes    Quit date: 2009  . Smokeless tobacco: Never Used  . Alcohol use No     Comment: Quit in 4/18     Allergies   Patient has no known allergies.   Review of Systems Review of Systems  All other systems reviewed and are negative.    Physical Exam Updated Vital Signs BP 137/81 (BP Location: Left Arm)   Pulse 90   Temp 98 F (36.7 C) (Oral)   Resp 18   Ht 5\' 11"  (1.803 m)   Wt 111.1 kg (245 lb)   SpO2 97%   BMI 34.17 kg/m   Physical Exam  Constitutional: He is oriented to person, place, and time. He  appears well-developed.  HENT:  Head: Atraumatic.  Neck: Neck supple.  Cardiovascular: Normal rate.   Pulmonary/Chest: Effort normal.  Abdominal: Soft. He exhibits distension. There is tenderness. There is no rebound and no guarding.  Musculoskeletal: He exhibits edema and tenderness.  Neurological: He is alert and oriented to person, place, and time.  Skin: Skin is warm.  Nursing note and vitals reviewed.    ED Treatments / Results  Labs (all labs ordered are listed, but only abnormal results are displayed) Labs Reviewed  COMPREHENSIVE METABOLIC PANEL - Abnormal; Notable for the following:       Result Value   Sodium 134 (*)    Glucose, Bld 148 (*)    Calcium 7.9 (*)    Total Protein 6.2 (*)    Albumin 2.1 (*)    AST 50 (*)    Total Bilirubin 2.5 (*)    All other components within normal limits  CBC WITH DIFFERENTIAL/PLATELET - Abnormal; Notable for the following:    RBC 3.73 (*)    Hemoglobin 10.0 (*)    HCT 29.5 (*)    RDW 22.2 (*)    Platelets 67 (*)    All other components within normal limits  PROTIME-INR - Abnormal; Notable for the following:    Prothrombin Time 23.9 (*)    All other components within normal limits  LACTIC ACID, PLASMA - Abnormal; Notable for the following:    Lactic Acid, Venous 2.9 (*)    All other components within normal limits  BASIC METABOLIC PANEL - Abnormal; Notable for the following:    Glucose, Bld 115 (*)    Calcium 7.3 (*)    All other components within normal limits  CBC - Abnormal; Notable for the following:    RBC 3.04 (*)    Hemoglobin 7.7 (*)    HCT 23.7 (*)    MCH 25.3 (*)    RDW 21.8 (*)    Platelets 50 (*)    All other components within normal limits  BODY FLUID CELL COUNT WITH DIFFERENTIAL - Abnormal; Notable for the following:    Appearance, Fluid TURBID (*)    WBC, Fluid 1,720 (*)    Neutrophil Count, Fluid 89 (*)    Monocyte-Macrophage-Serous Fluid 8 (*)    All other components within normal limits  LACTATE  DEHYDROGENASE, PLEURAL OR PERITONEAL FLUID - Abnormal; Notable for the following:    LD, Fluid 49 (*)    All other components within normal limits  VITAMIN B12 - Abnormal; Notable for the following:    Vitamin B-12 1,988 (*)    All other components within normal limits  IRON AND TIBC - Abnormal; Notable for the following:    Saturation Ratios 62 (*)  All other components within normal limits  RETICULOCYTES - Abnormal; Notable for the following:    RBC. 3.71 (*)    All other components within normal limits  HEMOGLOBIN AND HEMATOCRIT, BLOOD - Abnormal; Notable for the following:    Hemoglobin 9.6 (*)    HCT 29.5 (*)    All other components within normal limits  HEPATIC FUNCTION PANEL - Abnormal; Notable for the following:    Total Protein 6.2 (*)    Albumin 2.0 (*)    AST 49 (*)    Total Bilirubin 3.6 (*)    Bilirubin, Direct 1.1 (*)    Indirect Bilirubin 2.5 (*)    All other components within normal limits  CBC - Abnormal; Notable for the following:    RBC 3.39 (*)    Hemoglobin 8.9 (*)    HCT 27.2 (*)    RDW 22.0 (*)    Platelets 57 (*)    All other components within normal limits  BASIC METABOLIC PANEL - Abnormal; Notable for the following:    Glucose, Bld 117 (*)    Calcium 7.5 (*)    All other components within normal limits  CBC - Abnormal; Notable for the following:    RBC 3.81 (*)    Hemoglobin 9.6 (*)    HCT 30.3 (*)    MCH 25.2 (*)    RDW 21.7 (*)    Platelets 73 (*)    All other components within normal limits  BASIC METABOLIC PANEL - Abnormal; Notable for the following:    Sodium 134 (*)    Glucose, Bld 103 (*)    Calcium 7.9 (*)    All other components within normal limits  I-STAT CG4 LACTIC ACID, ED - Abnormal; Notable for the following:    Lactic Acid, Venous 3.23 (*)    All other components within normal limits  I-STAT CG4 LACTIC ACID, ED - Abnormal; Notable for the following:    Lactic Acid, Venous 3.00 (*)    All other components within normal  limits  CULTURE, BLOOD (ROUTINE X 2)  CULTURE, BLOOD (ROUTINE X 2)  GRAM STAIN  CULTURE, BODY FLUID-BOTTLE  URINALYSIS, ROUTINE W REFLEX MICROSCOPIC  ETHANOL  LACTIC ACID, PLASMA  PROCALCITONIN  HIV ANTIBODY (ROUTINE TESTING)  GLUCOSE, PLEURAL OR PERITONEAL FLUID  PROTEIN, PLEURAL OR PERITONEAL FLUID  AMYLASE, PLEURAL OR PERITONEAL FLUID   ALBUMIN, PLEURAL OR PERITONEAL FLUID  TRIGLYCERIDES, BODY FLUIDS  TOTAL BILIRUBIN, BODY FLUID  FOLATE  FERRITIN  PH, BODY FLUID  CYTOLOGY - NON PAP  CYTOLOGY - NON PAP    EKG  EKG Interpretation None       Radiology No results found.  Procedures Procedures (including critical care time)  EMERGENCY DEPARTMENT [place] ACITES EXAM "Study: Limited Abdominal Ultrasound for Evaluation of Free Fluid"  INDICATIONS: Abd pain and Distention  PERFORMED BY: Myself IMAGES ARCHIVED?: Yes VIEWS USES: Right lower quad and Left lower quad INTERPRETATION: Free fluid present      Medications Ordered in ED Medications  lidocaine (PF) (XYLOCAINE) 1 % injection (not administered)  cefTRIAXone (ROCEPHIN) 2 g in dextrose 5 % 50 mL IVPB (0 g Intravenous Stopped 08/13/16 2327)  metroNIDAZOLE (FLAGYL) IVPB 500 mg (0 mg Intravenous Stopped 08/13/16 2357)  oxyCODONE-acetaminophen (PERCOCET/ROXICET) 5-325 MG per tablet 1 tablet (1 tablet Oral Given 08/13/16 2342)  vancomycin (VANCOCIN) 1,500 mg in sodium chloride 0.9 % 500 mL IVPB (1,500 mg Intravenous Transfusing/Transfer 08/14/16 0054)  albumin human 25 % solution 12.5 g (12.5 g Intravenous New  Bag/Given 08/14/16 1212)  magnesium citrate solution 0.5 Bottle (0.5 Bottles Oral Given 08/16/16 1545)     Initial Impression / Assessment and Plan / ED Course  I have reviewed the triage vital signs and the nursing notes.  Pertinent labs & imaging results that were available during my care of the patient were reviewed by me and considered in my medical decision making (see chart for details).      Pt with fevers, cough, dib. He has volume overload - due to his alcoholis liver cirrhosis. However, he has high grade fevers and so we will admit him for further workup of possible infection. Pt's abd shows ascites, but no large pockets for [place] to drain. Abd pain has been chronic so suspicion for acute infection is low.  Final Clinical Impressions(s) / ED Diagnoses   Final diagnoses:  Ascites  Other ascites  Scrotal swelling    New Prescriptions Discharge Medication List as of 08/16/2016  5:55 PM    START taking these medications   Details  spironolactone (ALDACTONE) 100 MG tablet Take 1 tablet (100 mg total) by mouth daily., Starting Thu 08/17/2016, Print         Varney Biles, MD 08/21/16 2008    Varney Biles, MD 08/21/16 2008

## 2016-09-06 ENCOUNTER — Ambulatory Visit: Payer: Medicaid Other | Attending: Family Medicine | Admitting: Family Medicine

## 2016-09-06 ENCOUNTER — Encounter: Payer: Self-pay | Admitting: Family Medicine

## 2016-09-06 VITALS — BP 115/73 | HR 85 | Temp 97.7°F | Ht 71.0 in | Wt 204.6 lb

## 2016-09-06 DIAGNOSIS — E669 Obesity, unspecified: Secondary | ICD-10-CM

## 2016-09-06 DIAGNOSIS — M79605 Pain in left leg: Secondary | ICD-10-CM

## 2016-09-06 DIAGNOSIS — D649 Anemia, unspecified: Secondary | ICD-10-CM

## 2016-09-06 DIAGNOSIS — K7031 Alcoholic cirrhosis of liver with ascites: Secondary | ICD-10-CM | POA: Diagnosis not present

## 2016-09-06 DIAGNOSIS — E1169 Type 2 diabetes mellitus with other specified complication: Secondary | ICD-10-CM

## 2016-09-06 DIAGNOSIS — Z87891 Personal history of nicotine dependence: Secondary | ICD-10-CM | POA: Insufficient documentation

## 2016-09-06 DIAGNOSIS — I10 Essential (primary) hypertension: Secondary | ICD-10-CM | POA: Diagnosis not present

## 2016-09-06 DIAGNOSIS — M79604 Pain in right leg: Secondary | ICD-10-CM

## 2016-09-06 DIAGNOSIS — Z131 Encounter for screening for diabetes mellitus: Secondary | ICD-10-CM

## 2016-09-06 DIAGNOSIS — N433 Hydrocele, unspecified: Secondary | ICD-10-CM | POA: Diagnosis not present

## 2016-09-06 DIAGNOSIS — Z79899 Other long term (current) drug therapy: Secondary | ICD-10-CM | POA: Diagnosis not present

## 2016-09-06 DIAGNOSIS — I861 Scrotal varices: Secondary | ICD-10-CM | POA: Insufficient documentation

## 2016-09-06 LAB — GLUCOSE, POCT (MANUAL RESULT ENTRY): POC Glucose: 86 mg/dl (ref 70–99)

## 2016-09-06 LAB — POCT GLYCOSYLATED HEMOGLOBIN (HGB A1C): HEMOGLOBIN A1C: 4.6

## 2016-09-06 MED ORDER — GABAPENTIN 300 MG PO CAPS: 300.0000 mg | ORAL_CAPSULE | Freq: Two times a day (BID) | ORAL | 3 refills | Status: DC

## 2016-09-06 MED ORDER — SPIRONOLACTONE 100 MG PO TABS: 100.0000 mg | ORAL_TABLET | Freq: Every day | ORAL | 3 refills | Status: DC

## 2016-09-06 MED ORDER — FUROSEMIDE 40 MG PO TABS: 40.0000 mg | ORAL_TABLET | Freq: Every day | ORAL | 3 refills | Status: DC

## 2016-09-06 MED ORDER — CARVEDILOL 6.25 MG PO TABS: 6.2500 mg | ORAL_TABLET | Freq: Two times a day (BID) | ORAL | 3 refills | Status: DC

## 2016-09-06 MED FILL — FUROSEMIDE 40 MG TABLET: 40 | 30 days supply | Qty: 30 | Fill #0

## 2016-09-06 MED FILL — GABAPENTIN 300 MG CAPSULE: 300 | 30 days supply | Qty: 60 | Fill #0

## 2016-09-06 MED FILL — ?CARVEDILOL 6.25 MG TABLET: 6.25 | 30 days supply | Qty: 60 | Fill #0

## 2016-09-06 MED FILL — SPIRONOLACTONE 100 MG TAB: 100 | 30 days supply | Qty: 30 | Fill #0

## 2016-09-06 NOTE — Progress Notes (Signed)
Subjective:  Patient ID: [name], male    DOB: August 23, 1950  Age: 66 y.o. MRN: [id number]  CC: Hospitalization Follow-up   HPI [name] is a 66 year old male with history of hypertension, alcohol abuse, alcoholic liver cirrhosis here for hospital follow-up where he was managed for alcoholic liver cirrhosis and ascites from 08/13/16 through 08/16/16.  He had presented with approximately 40 pound weight gain, pedal edema, scrotal and penile edema ,chills and met the criteria for sepsis. He was placed on IV antibiotic for presumptive spontaneous bacterial peritonitis. Abdominal paracentesis yielded 3.9 L of cloudy yellow fluid with negative Gram stain and cultures.  Scrotal ultrasound was negative for torsion, epididymitis or orchitis but reveals small bilateral hydroceles and left sided varicocele. He was placed spironolactone in addition to his Lasix and other medications and subsequently discharged to follow-up outpatient here in the clinic and with GI.  He has been compliant with his medications and reports improvement in symptoms and his weight is stable at 204 pounds. He does have chronic shooting leg pains which are bilateral and are worse with going up a heel; sometime has numbness in his feet. He quit smoking several years ago.  Past Medical History:  Diagnosis Date  . Abdominal hernia   . Arthritis    "hands" (02/16/2016)  . Bright red rectal bleeding    "@ least 1-2 times/month; sometimes more" (02/16/2016)  . Cirrhosis ([place])   . Hypertension   . Type II diabetes mellitus ([place])     Past Surgical History:  Procedure Laterality Date  . COLONOSCOPY N/A 02/17/2016   Procedure: COLONOSCOPY;  Surgeon: [name], MD;  Location: [facility] ENDOSCOPY;  Service: Endoscopy;  Laterality: N/A;  . ESOPHAGOGASTRODUODENOSCOPY N/A 02/17/2016   Procedure: ESOPHAGOGASTRODUODENOSCOPY (EGD);  Surgeon: [name], MD;  Location: [facility] ENDOSCOPY;  Service: Endoscopy;  Laterality: N/A;  . FRACTURE  SURGERY    . IR PARACENTESIS  08/14/2016  . [place]   "has steel rods in it"    No Known Allergies   Outpatient Medications Prior to Visit  Medication Sig Dispense Refill  . atorvastatin (LIPITOR) 80 MG tablet Take 80 mg by mouth daily.    . ferrous sulfate 325 (65 FE) MG tablet Take 325 mg by mouth daily.    . carvedilol (COREG) 6.25 MG tablet Take 6.25 mg by mouth 2 (two) times daily.    . furosemide (LASIX) 40 MG tablet Take 1 tablet (40 mg total) by mouth daily. 30 tablet 1  . spironolactone (ALDACTONE) 100 MG tablet Take 1 tablet (100 mg total) by mouth daily. 30 tablet 0  . Multiple Vitamin (MULTIVITAMIN WITH MINERALS) TABS tablet Take 1 tablet by mouth daily. (Patient not taking: Reported on 09/06/2016) 30 tablet 0  . oxyCODONE-acetaminophen (PERCOCET/ROXICET) 5-325 MG tablet Take 1 tablet by mouth every 8 (eight) hours as needed for severe pain. (Patient not taking: Reported on 09/06/2016) 10 tablet 0   No facility-administered medications prior to visit.     ROS Review of Systems  Constitutional: Negative for activity change and appetite change.  HENT: Negative for sinus pressure and sore throat.   Eyes: Negative for visual disturbance.  Respiratory: Negative for cough, chest tightness and shortness of breath.   Cardiovascular: Negative for chest pain and leg swelling.  Gastrointestinal: Negative for abdominal distention, abdominal pain, constipation and diarrhea.  Endocrine: Negative.   Genitourinary: Negative for dysuria.  Musculoskeletal:       See hpi  Skin: Negative for rash.  Allergic/Immunologic: Negative.   Neurological: Positive for numbness. Negative for weakness and light-headedness.  Psychiatric/Behavioral: Negative for dysphoric mood and suicidal ideas.    Objective:  BP 115/73   Pulse 85   Temp 97.7 F (36.5 C) (Oral)   Ht '5\' 11"'$  (1.803 m)   Wt 204 lb 9.6 oz (92.8 kg)   SpO2 97%   BMI 28.54 kg/m   BP/Weight 09/06/2016  08/16/2016 7/41/6384  Systolic BP 536 468 -  Diastolic BP 73 81 -  Wt. (Lbs) 204.6 - 245  BMI 28.54 - 34.17      Physical Exam  Constitutional: He is oriented to person, place, and time. He appears well-developed and well-nourished.  Cardiovascular: Normal rate, normal heart sounds and intact distal pulses.   No murmur heard. Pulmonary/Chest: Effort normal and breath sounds normal. He has no wheezes. He has no rales. He exhibits no tenderness.  Abdominal: Soft. Bowel sounds are normal. He exhibits mass (Ventral hernia  superior to umbilicus). He exhibits no distension. There is no tenderness.  Musculoskeletal: Normal range of motion. He exhibits edema (1+ bilateral pitting edema).  Neurological: He is alert and oriented to person, place, and time.  Skin:  Area of grafted Skin on the lateral aspect of right leg  Psychiatric: He has a normal mood and affect.     Lab Results  Component Value Date   HGBA1C 4.6 09/06/2016    CMP Latest Ref Rng & Units 08/16/2016 08/15/2016 08/14/2016  Glucose 65 - 99 mg/dL 103(H) 117(H) 115(H)  BUN 6 - 20 mg/dL '10 11 8  '$ Creatinine 0.61 - 1.24 mg/dL 1.00 1.00 1.01  Sodium 135 - 145 mmol/L 134(L) 136 137  Potassium 3.5 - 5.1 mmol/L 3.6 3.7 3.6  Chloride 101 - 111 mmol/L 105 107 107  CO2 22 - 32 mmol/L '24 24 25  '$ Calcium 8.9 - 10.3 mg/dL 7.9(L) 7.5(L) 7.3(L)  Total Protein 6.5 - 8.1 g/dL - - 6.2(L)  Total Bilirubin 0.3 - 1.2 mg/dL - - 3.6(H)  Alkaline Phos 38 - 126 U/L - - 80  AST 15 - 41 U/L - - 49(H)  ALT 17 - 63 U/L - - 17    CBC    Component Value Date/Time   WBC 6.2 08/16/2016 0528   RBC 3.81 (L) 08/16/2016 0528   HGB 9.6 (L) 08/16/2016 0528   HCT 30.3 (L) 08/16/2016 0528   PLT 73 (L) 08/16/2016 0528   MCV 79.5 08/16/2016 0528   MCH 25.2 (L) 08/16/2016 0528   MCHC 31.7 08/16/2016 0528   RDW 21.7 (H) 08/16/2016 0528   LYMPHSABS 1.6 08/13/2016 2144   MONOABS 0.6 08/13/2016 2144   EOSABS 0.2 08/13/2016 2144   BASOSABS 0.1 08/13/2016  2144    Assessment & Plan:   1. Screening for diabetes mellitus A1c 4.6 - POCT glucose (manual entry) - POCT glycosylated hemoglobin (Hb E3O) - Basic Metabolic Panel; Future  2. Essential hypertension Controlled - carvedilol (COREG) 6.25 MG tablet; Take 1 tablet (6.25 mg total) by mouth 2 (two) times daily.  Dispense: 60 tablet; Refill: 3 - Lipid panel; Future  3. Normochromic normocytic anemia Last hemoglobin 9.6 Currently on first sulfate  4. Alcoholic cirrhosis of liver with ascites (HCC) MELD score of 20 Quit alcohol 4 months ago as per patient Referred to GI at discharge - he currently does not have medical coverage and will need to apply for financial assistance Will need to check potassium level. - spironolactone (ALDACTONE) 100 MG tablet; Take 1 tablet (  100 mg total) by mouth daily.  Dispense: 30 tablet; Refill: 3 - furosemide (LASIX) 40 MG tablet; Take 1 tablet (40 mg total) by mouth daily.  Dispense: 30 tablet; Refill: 3  5. Pain in both lower extremities Unknown etiology He has good pulses, low suspicion for PVD - gabapentin (NEURONTIN) 300 MG capsule; Take 1 capsule (300 mg total) by mouth 2 (two) times daily.  Dispense: 60 capsule; Refill: 3   Meds ordered this encounter  Medications  . spironolactone (ALDACTONE) 100 MG tablet    Sig: Take 1 tablet (100 mg total) by mouth daily.    Dispense:  30 tablet    Refill:  3  . carvedilol (COREG) 6.25 MG tablet    Sig: Take 1 tablet (6.25 mg total) by mouth 2 (two) times daily.    Dispense:  60 tablet    Refill:  3  . furosemide (LASIX) 40 MG tablet    Sig: Take 1 tablet (40 mg total) by mouth daily.    Dispense:  30 tablet    Refill:  3  . gabapentin (NEURONTIN) 300 MG capsule    Sig: Take 1 capsule (300 mg total) by mouth 2 (two) times daily.    Dispense:  60 capsule    Refill:  3    Follow-up: Return in about 6 weeks (around 10/18/2016) for Follow-up of chronic medical conditions.   This note has been  created with Surveyor, quantity. Any transcriptional errors are unintentional.     [name] MD

## 2016-09-06 NOTE — Patient Instructions (Signed)
Cirrhosis Cirrhosis is long-term (chronic) liver injury. The liver is your largest internal organ, and it performs many functions. The liver converts food into energy, removes toxic material from your blood, makes important proteins, and absorbs necessary vitamins from your diet. If you have cirrhosis, it means many of your healthy liver cells have been replaced by scar tissue. This prevents blood from flowing through your liver, which makes it difficult for your liver to function. This scarring is not reversible, but treatment can prevent it from getting worse. What are the causes? Hepatitis C and long-term alcohol abuse are the most common causes of cirrhosis. Other causes include:  Nonalcoholic fatty liver disease.  Hepatitis B infection.  Autoimmune hepatitis.  Diseases that cause blockage of ducts inside the liver.  Inherited liver diseases.  Reactions to certain long-term medicines.  Parasitic infections.  Long-term exposure to certain toxins.  What increases the risk? You may have a higher risk of cirrhosis if you:  Have certain hepatitis viruses.  Abuse alcohol, especially if you are male.  Are overweight.  Share needles.  Have unprotected sex with someone who has hepatitis.  What are the signs or symptoms? You may not have any signs and symptoms at first. Symptoms may not develop until the damage to your liver starts to get worse. Signs and symptoms of cirrhosis may include:  Tenderness in the right-upper part of your abdomen.  Weakness and tiredness (fatigue).  Loss of appetite.  Nausea.  Weight loss and muscle loss.  Itchiness.  Yellow skin and eyes (jaundice).  Buildup of fluid in the abdomen (ascites).  Swelling of the feet and ankles (edema).  Appearance of tiny blood vessels under the skin.  Mental confusion.  Easy bruising and bleeding.  How is this diagnosed? Your health care provider may suspect cirrhosis based on your symptoms and  medical history, especially if you have other medical conditions or a history of alcohol abuse. Your health care provider will do a physical exam to feel your liver and check for signs of cirrhosis. Your health care provider may perform other tests, including:  Blood tests to check: ? Whether you have hepatitis B or C. ? Kidney function. ? Liver function.  Imaging tests such as: ? MRI or CT scan to look for changes seen in advanced cirrhosis. ? Ultrasound to see if normal liver tissue is being replaced by scar tissue.  A procedure using a long needle to take a sample of liver tissue (biopsy) for examination under a microscope. Liver biopsy can confirm the diagnosis of cirrhosis.  How is this treated? Treatment depends on how damaged your liver is and what caused the damage. Treatment may include treating cirrhosis symptoms or treating the underlying causes of the condition to try to slow the progression of the damage. Treatment may include:  Making lifestyle changes, such as: ? Eating a healthy diet. ? Restricting salt intake. ? Maintaining a healthy weight. ? Not abusing drugs or alcohol.  Taking medicines to: ? Treat liver infections or other infections. ? Control itching. ? Reduce fluid buildup. ? Reduce certain blood toxins. ? Reduce risk of bleeding from enlarged blood vessels in the stomach or esophagus (varices).  If varices are causing bleeding problems, you may need treatment with a procedure that ties up the vessels causing them to fall off (band ligation).  If cirrhosis is causing your liver to fail, your health care provider may recommend a liver transplant.  Other treatments may be recommended depending on any complications   of cirrhosis, such as liver-related kidney failure (hepatorenal syndrome).  Follow these instructions at home:  Take medicines only as directed by your health care provider. Do not use drugs that are toxic to your liver. Ask your health care  provider before taking any new medicines, including over-the-counter medicines.  Rest as needed.  Eat a well-balanced diet. Ask your health care provider or dietitian for more information.  You may have to follow a low-salt diet or restrict your water intake as directed.  Do not drink alcohol. This is especially important if you are taking acetaminophen.  Keep all follow-up visits as directed by your health care provider. This is important. Contact a health care provider if:  You have fatigue or weakness that is getting worse.  You develop swelling of the hands, feet, legs, or face.  You have a fever.  You develop loss of appetite.  You have nausea or vomiting.  You develop jaundice.  You develop easy bruising or bleeding. Get help right away if:  You vomit bright red blood or a material that looks like coffee grounds.  You have blood in your stools.  Your stools appear black and tarry.  You become confused.  You have chest pain or trouble breathing. This information is not intended to replace advice given to you by your health care provider. Make sure you discuss any questions you have with your health care provider. Document Released: 01/09/2005 Document Revised: 05/20/2015 Document Reviewed: 09/17/2013 Elsevier Interactive Patient Education  2018 [employer]  

## 2016-09-07 ENCOUNTER — Ambulatory Visit: Payer: Medicaid Other | Attending: Family Medicine

## 2016-09-07 DIAGNOSIS — Z131 Encounter for screening for diabetes mellitus: Secondary | ICD-10-CM | POA: Diagnosis present

## 2016-09-07 DIAGNOSIS — I10 Essential (primary) hypertension: Secondary | ICD-10-CM | POA: Insufficient documentation

## 2016-09-07 NOTE — Progress Notes (Signed)
Patient here for lab visit only 

## 2016-09-08 LAB — BASIC METABOLIC PANEL
BUN/Creatinine Ratio: 12 (ref 10–24)
BUN: 11 mg/dL (ref 8–27)
CALCIUM: 8.4 mg/dL — AB (ref 8.6–10.2)
CO2: 21 mmol/L (ref 20–29)
Chloride: 107 mmol/L — ABNORMAL HIGH (ref 96–106)
Creatinine, Ser: 0.93 mg/dL (ref 0.76–1.27)
GFR, EST AFRICAN AMERICAN: 100 mL/min/{1.73_m2} (ref >59)
GFR, EST NON AFRICAN AMERICAN: 86 mL/min/{1.73_m2} (ref >59)
Glucose: 113 mg/dL — ABNORMAL HIGH (ref 65–99)
POTASSIUM: 4.6 mmol/L (ref 3.5–5.2)
Sodium: 138 mmol/L (ref 134–144)

## 2016-09-08 LAB — LIPID PANEL
CHOL/HDL RATIO: 2.5 ratio (ref 0.0–5.0)
Cholesterol, Total: 88 mg/dL — ABNORMAL LOW (ref 100–199)
HDL: 35 mg/dL — AB (ref >39)
LDL Calculated: 42 mg/dL (ref 0–99)
Triglycerides: 56 mg/dL (ref 0–149)
VLDL CHOLESTEROL CAL: 11 mg/dL (ref 5–40)

## 2016-09-11 ENCOUNTER — Telehealth: Payer: Self-pay | Admitting: Family Medicine

## 2016-09-11 ENCOUNTER — Telehealth: Payer: Self-pay

## 2016-09-11 NOTE — Telephone Encounter (Signed)
Pt called back to review results, please f/up °

## 2016-09-11 NOTE — Telephone Encounter (Signed)
Pt was called and informed of lab results. 

## 2016-10-23 ENCOUNTER — Encounter (HOSPITAL_COMMUNITY): Payer: Self-pay | Admitting: Emergency Medicine

## 2016-10-23 ENCOUNTER — Emergency Department (HOSPITAL_COMMUNITY)
Admission: EM | Admit: 2016-10-23 | Discharge: 2016-10-23 | Disposition: A | Discharge status: Home / Self Care | Payer: Medicaid Other | Attending: Emergency Medicine | Admitting: Emergency Medicine

## 2016-10-23 DIAGNOSIS — Z87891 Personal history of nicotine dependence: Secondary | ICD-10-CM | POA: Diagnosis not present

## 2016-10-23 DIAGNOSIS — I10 Essential (primary) hypertension: Secondary | ICD-10-CM | POA: Diagnosis not present

## 2016-10-23 DIAGNOSIS — E119 Type 2 diabetes mellitus without complications: Secondary | ICD-10-CM | POA: Insufficient documentation

## 2016-10-23 DIAGNOSIS — K429 Umbilical hernia without obstruction or gangrene: Secondary | ICD-10-CM | POA: Diagnosis not present

## 2016-10-23 DIAGNOSIS — Z79899 Other long term (current) drug therapy: Secondary | ICD-10-CM | POA: Diagnosis not present

## 2016-10-23 DIAGNOSIS — R109 Unspecified abdominal pain: Secondary | ICD-10-CM | POA: Diagnosis present

## 2016-10-23 LAB — COMPREHENSIVE METABOLIC PANEL
ALBUMIN: 2.1 g/dL — AB (ref 3.5–5.0)
ALT: 19 U/L (ref 17–63)
ANION GAP: 5 (ref 5–15)
AST: 42 U/L — AB (ref 15–41)
Alkaline Phosphatase: 96 U/L (ref 38–126)
BILIRUBIN TOTAL: 2.1 mg/dL — AB (ref 0.3–1.2)
BUN: 5 mg/dL — AB (ref 6–20)
CHLORIDE: 108 mmol/L (ref 101–111)
CO2: 23 mmol/L (ref 22–32)
Calcium: 8 mg/dL — ABNORMAL LOW (ref 8.9–10.3)
Creatinine, Ser: 0.85 mg/dL (ref 0.61–1.24)
GFR calc Af Amer: >60 mL/min (ref >60)
GFR calc non Af Amer: >60 mL/min (ref >60)
GLUCOSE: 99 mg/dL (ref 65–99)
POTASSIUM: 4.3 mmol/L (ref 3.5–5.1)
SODIUM: 136 mmol/L (ref 135–145)
Total Protein: 6.3 g/dL — ABNORMAL LOW (ref 6.5–8.1)

## 2016-10-23 LAB — CBC
HEMATOCRIT: 30.2 % — AB (ref 39.0–52.0)
HEMOGLOBIN: 9.7 g/dL — AB (ref 13.0–17.0)
MCH: 25.1 pg — AB (ref 26.0–34.0)
MCHC: 32.1 g/dL (ref 30.0–36.0)
MCV: 78.2 fL (ref 78.0–100.0)
Platelets: 47 10*3/uL — ABNORMAL LOW (ref 150–400)
RBC: 3.86 MIL/uL — ABNORMAL LOW (ref 4.22–5.81)
RDW: 21.2 % — AB (ref 11.5–15.5)
WBC: 3.4 10*3/uL — ABNORMAL LOW (ref 4.0–10.5)

## 2016-10-23 LAB — URINALYSIS, ROUTINE W REFLEX MICROSCOPIC
BILIRUBIN URINE: NEGATIVE
GLUCOSE, UA: NEGATIVE mg/dL
HGB URINE DIPSTICK: NEGATIVE
KETONES UR: NEGATIVE mg/dL
Leukocytes, UA: NEGATIVE
Nitrite: NEGATIVE
PH: 6 (ref 5.0–8.0)
Protein, ur: NEGATIVE mg/dL
SPECIFIC GRAVITY, URINE: 1.017 (ref 1.005–1.030)

## 2016-10-23 LAB — LIPASE, BLOOD: Lipase: 42 U/L (ref 11–51)

## 2016-10-23 MED ORDER — NAPROXEN 250 MG PO TABS
375.0000 mg | ORAL_TABLET | Freq: Once | ORAL | Status: AC
  Administered 2016-10-23: 375 mg via ORAL
  Filled 2016-10-23: qty 2

## 2016-10-23 MED ORDER — NAPROXEN 500 MG PO TABS: 500.0000 mg | ORAL_TABLET | Freq: Two times a day (BID) | ORAL | 0 refills | Status: DC

## 2016-10-23 NOTE — ED Provider Notes (Signed)
[facility] DEPT Provider Note   CSN: [id number] Arrival date & time: 10/23/16  0737     History   Chief Complaint Chief Complaint  Patient presents with  . Abdominal Pain    HPI [name] is a 66 y.o. male with history of supraumbilical hernia who presents with worsening symptoms from the hernia. Patient reports he has had the supra umbilical hernia since 1262 and has popped in and out since then. He reports that his been coming in and out more often and causing him more pain when it pops out. He does always go back in, which resolves his pain. He reports it is worse when he tries to lift something. He has not taking any medications at home for symptoms. He has not seen a surgery for this. He denies any fevers, chest pain, shortness of breath, nausea, vomiting, bloody stools, constipation ,or diarrhea. Patient has history of anemia as well and reports he takes his iron "every now and then."  HPI  Past Medical History:  Diagnosis Date  . Abdominal hernia   . Arthritis    "hands" (02/16/2016)  . Bright red rectal bleeding    "@ least 1-2 times/month; sometimes more" (02/16/2016)  . Cirrhosis ([place])   . Hypertension   . Type II diabetes mellitus [facility])     Patient Active Problem List   Diagnosis Date Noted  . SIRS (systemic inflammatory response syndrome) ([place]) 08/14/2016  . Prolonged QT interval 08/14/2016  . Anxiety 03/06/2016  . Insomnia 03/06/2016  . Hypertension 03/06/2016  . Hepatitis C antibody test positive 02/18/2016  . Thrombocytopenia ([place]) 02/16/2016  . Normochromic normocytic anemia 02/16/2016  . Alcohol abuse 02/16/2016  . Cirrhosis of liver ([place]) 02/16/2016    Past Surgical History:  Procedure Laterality Date  . COLONOSCOPY N/A 02/17/2016   Procedure: COLONOSCOPY;  Surgeon: [name], MD;  Location: [facility] ENDOSCOPY;  Service: Endoscopy;  Laterality: N/A;  . ESOPHAGOGASTRODUODENOSCOPY N/A 02/17/2016   Procedure: ESOPHAGOGASTRODUODENOSCOPY (EGD);   Surgeon: [name], MD;  Location: [facility] ENDOSCOPY;  Service: Endoscopy;  Laterality: N/A;  . FRACTURE SURGERY    . IR PARACENTESIS  08/14/2016  . [place]   "has steel rods in it"       Home Medications    Prior to Admission medications   Medication Sig Start Date End Date Taking? Authorizing Provider  atorvastatin (LIPITOR) 80 MG tablet Take 80 mg by mouth daily. 06/27/16   [provider]  carvedilol (COREG) 6.25 MG tablet Take 1 tablet (6.25 mg total) by mouth 2 (two) times daily. 09/06/16   [name], MD  ferrous sulfate 325 (65 FE) MG tablet Take 325 mg by mouth daily. 06/28/16   [provider]  furosemide (LASIX) 40 MG tablet Take 1 tablet (40 mg total) by mouth daily. 09/06/16   [name], MD  gabapentin (NEURONTIN) 300 MG capsule Take 1 capsule (300 mg total) by mouth 2 (two) times daily. 09/06/16   [name], MD  Multiple Vitamin (MULTIVITAMIN WITH MINERALS) TABS tablet Take 1 tablet by mouth daily. Patient not taking: Reported on 09/06/2016 02/18/16   [name], MD  naproxen (NAPROSYN) 500 MG tablet Take 1 tablet (500 mg total) by mouth 2 (two) times daily. 10/23/16   Addalyn Speedy, Bea Graff, PA-C  oxyCODONE-acetaminophen (PERCOCET/ROXICET) 5-325 MG tablet Take 1 tablet by mouth every 8 (eight) hours as needed for severe pain. Patient not taking: Reported on 09/06/2016 02/18/16   [name], MD  spironolactone (ALDACTONE) 100  MG tablet Take 1 tablet (100 mg total) by mouth daily. 09/06/16   [name], MD    Family History Family History  Problem Relation Age of Onset  . Hypertension Other     Social History Social History  Substance Use Topics  . Smoking status: Former Smoker    Packs/day: 0.50    Years: 38.00    Types: Cigarettes    Quit date: 2009  . Smokeless tobacco: Never Used  . Alcohol use No     Comment: Quit in 4/18     Allergies   Patient has no known allergies.   Review of Systems Review of Systems   Constitutional: Negative for chills and fever.  HENT: Negative for facial swelling and sore throat.   Respiratory: Negative for shortness of breath.   Cardiovascular: Negative for chest pain.  Gastrointestinal: Positive for abdominal pain. Negative for blood in stool, constipation, diarrhea, nausea and vomiting.  Genitourinary: Negative for dysuria.  Musculoskeletal: Negative for back pain.  Skin: Negative for rash and wound.  Neurological: Negative for headaches.  Psychiatric/Behavioral: The patient is not nervous/anxious.      Physical Exam Updated Vital Signs BP (!) 155/94 (BP Location: Left Arm)   Pulse 64   Temp 97.9 F (36.6 C) (Oral)   Resp 16   Ht 5\' 11"  (1.803 m)   Wt 95.3 kg (210 lb)   SpO2 97%   BMI 29.29 kg/m   Physical Exam  Constitutional: He appears well-developed and well-nourished. No distress.  HENT:  Head: Normocephalic and atraumatic.  Mouth/Throat: Oropharynx is clear and moist. No oropharyngeal exudate.  Eyes: Pupils are equal, round, and reactive to light. Conjunctivae are normal. Right eye exhibits no discharge. Left eye exhibits no discharge. No scleral icterus.  Neck: Normal range of motion. Neck supple. No thyromegaly present.  Cardiovascular: Normal rate, regular rhythm, normal heart sounds and intact distal pulses.  Exam reveals no gallop and no friction rub.   No murmur heard. Pulmonary/Chest: Effort normal and breath sounds normal. No stridor. No respiratory distress. He has no wheezes. He has no rales.  Abdominal: Soft. Bowel sounds are normal. He exhibits no distension. There is tenderness in the periumbilical area. There is no rebound and no guarding. A hernia (supra-umbilical) is present.    Musculoskeletal: He exhibits no edema.  Lymphadenopathy:    He has no cervical adenopathy.  Neurological: He is alert. Coordination normal.  Skin: Skin is warm and dry. No rash noted. He is not diaphoretic. No pallor.  Psychiatric: He has a normal  mood and affect.  Nursing note and vitals reviewed.    ED Treatments / Results  Labs (all labs ordered are listed, but only abnormal results are displayed) Labs Reviewed  COMPREHENSIVE METABOLIC PANEL - Abnormal; Notable for the following:       Result Value   BUN 5 (*)    Calcium 8.0 (*)    Total Protein 6.3 (*)    Albumin 2.1 (*)    AST 42 (*)    Total Bilirubin 2.1 (*)    All other components within normal limits  CBC - Abnormal; Notable for the following:    WBC 3.4 (*)    RBC 3.86 (*)    Hemoglobin 9.7 (*)    HCT 30.2 (*)    MCH 25.1 (*)    RDW 21.2 (*)    Platelets 47 (*)    All other components within normal limits  LIPASE, BLOOD  URINALYSIS, ROUTINE W REFLEX  MICROSCOPIC    EKG  EKG Interpretation None       Radiology No results found.  Procedures Procedures (including critical care time)  Medications Ordered in ED Medications  naproxen (NAPROSYN) tablet 375 mg (375 mg Oral Given 10/23/16 1323)     Initial Impression / Assessment and Plan / ED Course  I have reviewed the triage vital signs and the nursing notes.  Pertinent labs & imaging results that were available during my care of the patient were reviewed by me and considered in my medical decision making (see chart for details).     Patient with chronic umbilical hernia that has been coming in and out more often lately. It is sore when it pops out, but resolved after getting reduced, oftentimes spontaneously. Labs are stable for the patient, except with mildly decreased WBC 3.4, and platelets 40 7K, however patient does have history of thrombocytopenia. Patient with stable chronic anemia, hemoglobin 9.7. I advised patient to resume taking iron regularly. CMP shows AST 42, total bilirubin 2.1. UA negative. Hernia is reducible, patient feeling better after reduce his hernia. No color change or induration concerning for strength relation or incarceration. We'll discharge patient home with Naprosyn for  pain control with follow-up to PCP for recheck of CBC and general surgery for further evaluation and treatment for hernia. Strict return precautions given. Patient understands and agrees with plan. Patient vitals stable throughout ED course and discharged in satisfactory condition. I discussed patient case with Dr. Vanita Panda who guided the patient's management and agrees with plan.   Final Clinical Impressions(s) / ED Diagnoses   Final diagnoses:  Umbilical hernia without obstruction and without gangrene    New Prescriptions New Prescriptions   NAPROXEN (NAPROSYN) 500 MG TABLET    Take 1 tablet (500 mg total) by mouth 2 (two) times daily.     Frederica Kuster, PA-C 10/23/16 1328    Carmin Muskrat, MD 10/23/16 [phone]

## 2016-10-23 NOTE — ED Notes (Signed)
C/o abd. Pain x 2 years , states he is hurting worse the past 2 weeks. , states every morning when he wakes up his abd. Hernia is protruding out he is usually able to get it to go back in. Sore today.

## 2016-10-23 NOTE — ED Triage Notes (Signed)
Pt to ER for evaluation of umbilical hernia pain, states "it has been popping out more than usual and it won't go back in all the time." pain 10/10 at triage, denies any other symptoms.

## 2016-10-23 NOTE — Discharge Instructions (Signed)
Medications: Naprosyn  Treatment: take Naprosyn twice daily as needed for pain. Use ice on your hernia to help it go back if it is stuck.  Follow-up: Please follow-up with the surgery office by calling the number below for further evaluation and treatment. Please return to emergency department if you develop any new or worsening symptoms including your hernia becoming hard, changing to a dark color, inability to move your bowels, or any other new or concerning symptoms. Please see your primary care provider about blood counts today. Your white blood cell count was 3.4, which is lower than normal for you. Your platelets were also 47,000, which is little lower than normal for you. It is important that you doctor recheck these and further evaluate the cause. Resume taking your iron, as this helps you hemoglobin levels, which are also low today (9.7).  See the circled number outlined in black for further assistance in finding a new primary care provider.

## 2017-01-19 ENCOUNTER — Other Ambulatory Visit: Payer: Self-pay

## 2017-01-19 ENCOUNTER — Encounter (HOSPITAL_COMMUNITY): Payer: Self-pay | Admitting: Emergency Medicine

## 2017-01-19 ENCOUNTER — Emergency Department (HOSPITAL_COMMUNITY)
Admission: EM | Admit: 2017-01-19 | Discharge: 2017-01-19 | Disposition: A | Discharge status: Home / Self Care | Payer: Medicaid Other | Attending: Emergency Medicine | Admitting: Emergency Medicine

## 2017-01-19 DIAGNOSIS — I10 Essential (primary) hypertension: Secondary | ICD-10-CM | POA: Diagnosis not present

## 2017-01-19 DIAGNOSIS — R1084 Generalized abdominal pain: Secondary | ICD-10-CM | POA: Diagnosis present

## 2017-01-19 DIAGNOSIS — K429 Umbilical hernia without obstruction or gangrene: Secondary | ICD-10-CM

## 2017-01-19 DIAGNOSIS — E119 Type 2 diabetes mellitus without complications: Secondary | ICD-10-CM | POA: Insufficient documentation

## 2017-01-19 DIAGNOSIS — Z87891 Personal history of nicotine dependence: Secondary | ICD-10-CM | POA: Insufficient documentation

## 2017-01-19 LAB — COMPREHENSIVE METABOLIC PANEL
ALK PHOS: 94 U/L (ref 38–126)
ALT: 17 U/L (ref 17–63)
ANION GAP: 5 (ref 5–15)
AST: 43 U/L — ABNORMAL HIGH (ref 15–41)
Albumin: 2.4 g/dL — ABNORMAL LOW (ref 3.5–5.0)
BILIRUBIN TOTAL: 1.8 mg/dL — AB (ref 0.3–1.2)
BUN: 7 mg/dL (ref 6–20)
CALCIUM: 8.1 mg/dL — AB (ref 8.9–10.3)
CO2: 23 mmol/L (ref 22–32)
CREATININE: 0.78 mg/dL (ref 0.61–1.24)
Chloride: 109 mmol/L (ref 101–111)
Glucose, Bld: 112 mg/dL — ABNORMAL HIGH (ref 65–99)
Potassium: 4.1 mmol/L (ref 3.5–5.1)
SODIUM: 137 mmol/L (ref 135–145)
TOTAL PROTEIN: 6.2 g/dL — AB (ref 6.5–8.1)

## 2017-01-19 LAB — I-STAT CG4 LACTIC ACID, ED: LACTIC ACID, VENOUS: 2.11 mmol/L — AB (ref 0.5–1.9)

## 2017-01-19 LAB — URINALYSIS, ROUTINE W REFLEX MICROSCOPIC
Bilirubin Urine: NEGATIVE
Glucose, UA: NEGATIVE mg/dL
HGB URINE DIPSTICK: NEGATIVE
Ketones, ur: NEGATIVE mg/dL
Leukocytes, UA: NEGATIVE
NITRITE: NEGATIVE
PROTEIN: NEGATIVE mg/dL
SPECIFIC GRAVITY, URINE: 1.006 (ref 1.005–1.030)
pH: 5 (ref 5.0–8.0)

## 2017-01-19 LAB — CBC
HCT: 28.9 % — ABNORMAL LOW (ref 39.0–52.0)
HEMOGLOBIN: 8.9 g/dL — AB (ref 13.0–17.0)
MCH: 23.2 pg — AB (ref 26.0–34.0)
MCHC: 30.8 g/dL (ref 30.0–36.0)
MCV: 75.5 fL — ABNORMAL LOW (ref 78.0–100.0)
PLATELETS: 67 10*3/uL — AB (ref 150–400)
RBC: 3.83 MIL/uL — AB (ref 4.22–5.81)
RDW: 21.4 % — ABNORMAL HIGH (ref 11.5–15.5)
WBC: 3.7 10*3/uL — AB (ref 4.0–10.5)

## 2017-01-19 LAB — LIPASE, BLOOD: Lipase: 29 U/L (ref 11–51)

## 2017-01-19 MED ORDER — HYDROCODONE-ACETAMINOPHEN 5-325 MG PO TABS
1.0000 | ORAL_TABLET | Freq: Once | ORAL | Status: AC
  Administered 2017-01-19: 1 via ORAL
  Filled 2017-01-19: qty 1

## 2017-01-19 NOTE — ED Notes (Signed)
I gave critical I Stat CG4 to MD Little

## 2017-01-19 NOTE — ED Provider Notes (Signed)
[facility] DEPT Provider Note   CSN: [id number] Arrival date & time: 01/19/17  0116     History   Chief Complaint Chief Complaint  Patient presents with  . Abdominal Pain    HPI [name] is a 66 y.o. male.  66 year old male with extensive past medical history including liver cirrhosis, alcohol abuse, hypertension, type 2 diabetes mellitus who presents with umbilical hernia pain.  The patient has a long history of umbilical hernia and states that his umbilical hernia pain has been much worse over the past 1 month.  Pain is severe, sharp, worse when he sits up or moves around.  He reports normal bowel movements with no diarrhea, constipation, or blood in his stool.  No vomiting.  Normal eating and drinking.  No fevers or recent illness.  He has seen general surgery in the past but has not followed up recently.   The history is provided by the patient.  Abdominal Pain      Past Medical History:  Diagnosis Date  . Abdominal hernia   . Arthritis    "hands" (02/16/2016)  . Bright red rectal bleeding    "@ least 1-2 times/month; sometimes more" (02/16/2016)  . Cirrhosis ([place])   . Hypertension   . Type II diabetes mellitus [facility])     Patient Active Problem List   Diagnosis Date Noted  . SIRS (systemic inflammatory response syndrome) ([place]) 08/14/2016  . Prolonged QT interval 08/14/2016  . Anxiety 03/06/2016  . Insomnia 03/06/2016  . Hypertension 03/06/2016  . Hepatitis C antibody test positive 02/18/2016  . Thrombocytopenia ([place]) 02/16/2016  . Normochromic normocytic anemia 02/16/2016  . Alcohol abuse 02/16/2016  . Cirrhosis of liver ([place]) 02/16/2016    Past Surgical History:  Procedure Laterality Date  . COLONOSCOPY N/A 02/17/2016   Procedure: COLONOSCOPY;  Surgeon: [name], MD;  Location: [facility] ENDOSCOPY;  Service: Endoscopy;  Laterality: N/A;  . ESOPHAGOGASTRODUODENOSCOPY N/A 02/17/2016   Procedure: ESOPHAGOGASTRODUODENOSCOPY (EGD);   Surgeon: [name], MD;  Location: [facility] ENDOSCOPY;  Service: Endoscopy;  Laterality: N/A;  . FRACTURE SURGERY    . IR PARACENTESIS  08/14/2016  . [place]   "has steel rods in it"       Home Medications    Prior to Admission medications   Not on File    Family History Family History  Problem Relation Age of Onset  . Hypertension Other     Social History Social History   Tobacco Use  . Smoking status: Former Smoker    Packs/day: 0.50    Years: 38.00    Pack years: 19.00    Types: Cigarettes    Last attempt to quit: 2009    Years since quitting: 9.9  . Smokeless tobacco: Never Used  Substance Use Topics  . Alcohol use: No    Comment: Quit in 4/18  . Drug use: No     Allergies   Patient has no known allergies.   Review of Systems Review of Systems  Gastrointestinal: Positive for abdominal pain.   All other systems reviewed and are negative except that which was mentioned in HPI   Physical Exam Updated Vital Signs BP 121/90 (BP Location: Right Arm)   Pulse 84   Temp 97.7 F (36.5 C) (Oral)   Resp 14   Ht 5\' 11"  (1.803 m)   Wt 90.7 kg (200 lb)   SpO2 100%   BMI 27.89 kg/m   Physical Exam  Constitutional: He is  oriented to person, place, and time. He appears well-developed and well-nourished. No distress.  HENT:  Head: Normocephalic and atraumatic.  Moist mucous membranes  Eyes: Conjunctivae are normal. Pupils are equal, round, and reactive to light.  Neck: Neck supple.  Cardiovascular: Normal rate, regular rhythm and normal heart sounds.  No murmur heard. Pulmonary/Chest: Effort normal and breath sounds normal.  Abdominal: Soft. Bowel sounds are normal. He exhibits distension (moderate) and ascites. There is no tenderness. A hernia is present.  Easily reducible umbilical hernia which is nontender to palpation, no skin changes, no focal abdominal tenderness  Musculoskeletal: He exhibits no edema.  Neurological: He is alert  and oriented to person, place, and time.  Fluent speech  Skin: Skin is warm and dry.  Psychiatric: He has a normal mood and affect. Judgment normal.  Nursing note and vitals reviewed.    ED Treatments / Results  Labs (all labs ordered are listed, but only abnormal results are displayed) Labs Reviewed  COMPREHENSIVE METABOLIC PANEL - Abnormal; Notable for the following components:      Result Value   Glucose, Bld 112 (*)    Calcium 8.1 (*)    Total Protein 6.2 (*)    Albumin 2.4 (*)    AST 43 (*)    Total Bilirubin 1.8 (*)    All other components within normal limits  CBC - Abnormal; Notable for the following components:   WBC 3.7 (*)    RBC 3.83 (*)    Hemoglobin 8.9 (*)    HCT 28.9 (*)    MCV 75.5 (*)    MCH 23.2 (*)    RDW 21.4 (*)    Platelets 67 (*)    All other components within normal limits  I-STAT CG4 LACTIC ACID, ED - Abnormal; Notable for the following components:   Lactic Acid, Venous 2.11 (*)    All other components within normal limits  LIPASE, BLOOD  URINALYSIS, ROUTINE W REFLEX MICROSCOPIC  I-STAT CG4 LACTIC ACID, ED    EKG  EKG Interpretation None       Radiology No results found.  Procedures Procedures (including critical care time)  Medications Ordered in ED Medications  HYDROcodone-acetaminophen (NORCO/VICODIN) 5-325 MG per tablet 1 tablet (not administered)     Initial Impression / Assessment and Plan / ED Course  I have reviewed the triage vital signs and the nursing notes.  Pertinent labs that were available during my care of the patient were reviewed by me and considered in my medical decision making (see chart for details).     Chronic umbilical hernia.  Easily reducible without pain on exam and I suspect the defect is large enough that risk of incarcerated or strangulated bowel is low.  Because he has no tenderness and it is easily reduced at bedside, I do not feel he needs imaging currently.  His lab work is overall reassuring  with stable labs from previous.  He is tolerating [employer] here.  Have recommended following up with general surgery again to discuss options.  Extensively reviewed return precautions. Final Clinical Impressions(s) / ED Diagnoses   Final diagnoses:  Umbilical hernia without obstruction and without gangrene    ED Discharge Orders    None       Willian Donson, Wenda Overland, MD 01/19/17 [phone]

## 2017-01-19 NOTE — ED Notes (Signed)
Urine culture obtained and sent to lab to save if needed.

## 2017-01-19 NOTE — ED Triage Notes (Signed)
Pt presents from EMS for evaluation of hernia pain that became worse tonight.

## 2017-01-19 NOTE — ED Triage Notes (Signed)
Umbilical hernia noted and pt reports that has been out for the past month and getting worse with sharp pain.

## 2017-02-05 ENCOUNTER — Encounter: Payer: Self-pay | Admitting: Gastroenterology

## 2017-03-14 ENCOUNTER — Ambulatory Visit: Payer: Medicaid Other | Admitting: Gastroenterology

## 2017-04-30 ENCOUNTER — Ambulatory Visit (INDEPENDENT_AMBULATORY_CARE_PROVIDER_SITE_OTHER): Payer: Medicare HMO | Admitting: Gastroenterology

## 2017-04-30 ENCOUNTER — Other Ambulatory Visit (INDEPENDENT_AMBULATORY_CARE_PROVIDER_SITE_OTHER): Payer: Medicare HMO

## 2017-04-30 ENCOUNTER — Encounter: Payer: Self-pay | Admitting: Gastroenterology

## 2017-04-30 ENCOUNTER — Encounter (INDEPENDENT_AMBULATORY_CARE_PROVIDER_SITE_OTHER): Payer: Self-pay

## 2017-04-30 VITALS — BP 140/92 | HR 82 | Ht 71.0 in | Wt 205.0 lb

## 2017-04-30 DIAGNOSIS — K7460 Unspecified cirrhosis of liver: Secondary | ICD-10-CM

## 2017-04-30 LAB — CBC WITH DIFFERENTIAL/PLATELET
BASOS ABS: 0.1 10*3/uL (ref 0.0–0.1)
Basophils Relative: 2.1 % (ref 0.0–3.0)
EOS PCT: 5 % (ref 0.0–5.0)
Eosinophils Absolute: 0.2 10*3/uL (ref 0.0–0.7)
HEMATOCRIT: 25.8 % — AB (ref 39.0–52.0)
Hemoglobin: 8.1 g/dL — ABNORMAL LOW (ref 13.0–17.0)
LYMPHS ABS: 1.1 10*3/uL (ref 0.7–4.0)
LYMPHS PCT: 32.8 % (ref 12.0–46.0)
MCHC: 31.5 g/dL (ref 30.0–36.0)
MCV: 73.2 fl — AB (ref 78.0–100.0)
Monocytes Absolute: 0.4 10*3/uL (ref 0.1–1.0)
Monocytes Relative: 12.4 % — ABNORMAL HIGH (ref 3.0–12.0)
NEUTROS ABS: 1.6 10*3/uL (ref 1.4–7.7)
NEUTROS PCT: 47.7 % (ref 43.0–77.0)
RBC: 3.52 Mil/uL — ABNORMAL LOW (ref 4.22–5.81)
RDW: 23.1 % — ABNORMAL HIGH (ref 11.5–15.5)
WBC: 3.4 10*3/uL — ABNORMAL LOW (ref 4.0–10.5)

## 2017-04-30 LAB — IBC PANEL
IRON: 13 ug/dL — AB (ref 42–165)
SATURATION RATIOS: 3.3 % — AB (ref 20.0–50.0)
Transferrin: 282 mg/dL (ref 212.0–360.0)

## 2017-04-30 LAB — COMPREHENSIVE METABOLIC PANEL
ALT: 14 U/L (ref 0–53)
AST: 34 U/L (ref 0–37)
Albumin: 2.4 g/dL — ABNORMAL LOW (ref 3.5–5.2)
Alkaline Phosphatase: 81 U/L (ref 39–117)
BUN: 7 mg/dL (ref 6–23)
CALCIUM: 7.9 mg/dL — AB (ref 8.4–10.5)
CHLORIDE: 107 meq/L (ref 96–112)
CO2: 26 meq/L (ref 19–32)
Creatinine, Ser: 0.74 mg/dL (ref 0.40–1.50)
GFR: 112.74 mL/min (ref >60.00)
GLUCOSE: 96 mg/dL (ref 70–99)
POTASSIUM: 4.2 meq/L (ref 3.5–5.1)
Sodium: 136 mEq/L (ref 135–145)
Total Bilirubin: 1.7 mg/dL — ABNORMAL HIGH (ref 0.2–1.2)
Total Protein: 6.4 g/dL (ref 6.0–8.3)

## 2017-04-30 LAB — PROTIME-INR
INR: 2.2 ratio — ABNORMAL HIGH (ref 0.8–1.0)
Prothrombin Time: 23.2 s — ABNORMAL HIGH (ref 9.6–13.1)

## 2017-04-30 LAB — FERRITIN: FERRITIN: 13.2 ng/mL — AB (ref 22.0–322.0)

## 2017-04-30 NOTE — Progress Notes (Signed)
HPI: This is a  very pleasant 67 year old man who was referred to me by Annye English, MD at [facility] surgery for cirrhosis  Chief complaint is abdominal pain;  Has had a hernia for over a decade. AS the years have gone by, has become more painful..  he was recently evaluated by [facility] surgery for abdominal hernia repair, they sent him here for cirrhosis evaluation  He does recall in the past being told that he had cirrhosis.  He does not think he is ever seen a gastroenterologist for that specifically however I do see upper endoscopy and colonoscopy reports from Dr. Eulogio Bear in early 2018 when he was hospitalized.  He was an alcoholic, he tells me he significantly cut back drinking starting in April 2018 however he still drinks 1-3 beers weekly.  He does not recall ever being told he had hepatitis C.  When he was checked in he explained that he is not on any current medicines but as I questioned him he does says that he takes Lasix 40 mg periodically and Aldactone he thinks 100 mg every once in a while as well.   Old Data Reviewed:  CT scan abdomen pelvis with IV contrast Jan 2018 showed clearly cirrhotic liver with moderate volume ascites.  No mass lesions in his liver.  Abdominal ultrasound January 2018 showed small to trace volume of ascites  Colonoscopy by Dr. [name]  January 2018 while he was hospitalized found 8 polyps in his colon.  These were all removed and they were all found to be adenomas on pathology.  1 of them was tubulovillous.  He also had prominent rectal blood vessels.  I do not think he ever followed up with Dr. Benson Norway after this.   Upper endoscopy by Dr. [name] on January 2018 while he was hospitalized found "mild" portal gastropathy but no varices.  Paracentesis 07/2016 during admission for fever: ++SBP by cell count, High SAAG. He was briefly on abx while in hosp, not sent home on any.  D/c recs from that admission: aldactone 100 once daily, lasix 40  once daily   Most recent labs (12/2016): Hb 8.9, MCV 75, plt 67, AST 43, ALT 1.8, prot 6.2, alb 2.4  Liver workup labs:01/2016: HCV Ab ++, Hep B surface Ab negative, HepBCore IgM negative, Hep A iGm negative.     Review of systems: Pertinent positive and negative review of systems were noted in the above HPI section. All other review negative.   Past Medical History:  Diagnosis Date  . Abdominal hernia   . Arthritis    "hands" (02/16/2016)  . Bright red rectal bleeding    "@ least 1-2 times/month; sometimes more" (02/16/2016)  . Cirrhosis ([place])   . Hypertension   . Type II diabetes mellitus ([place])     Past Surgical History:  Procedure Laterality Date  . COLONOSCOPY N/A 02/17/2016   Procedure: COLONOSCOPY;  Surgeon: [name], MD;  Location: [facility] ENDOSCOPY;  Service: Endoscopy;  Laterality: N/A;  . ESOPHAGOGASTRODUODENOSCOPY N/A 02/17/2016   Procedure: ESOPHAGOGASTRODUODENOSCOPY (EGD);  Surgeon: [name], MD;  Location: [facility] ENDOSCOPY;  Service: Endoscopy;  Laterality: N/A;  . FRACTURE SURGERY    . IR PARACENTESIS  08/14/2016  . [place]   "has steel rods in it"    No current outpatient medications on file.   No current facility-administered medications for this visit.     Allergies as of 04/30/2017  . (No Known Allergies)  Family History  Problem Relation Age of Onset  . Hypertension Other     Social History   Socioeconomic History  . Marital status: Single    Spouse name: Not on file  . Number of children: Not on file  . Years of education: Not on file  . Highest education level: Not on file  Occupational History  . 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 Financial resource strain: Not on file  . Food insecurity:    Worry: Not on file    Inability: Not on file  . Transportation needs:    Medical: Not on file    Non-medical: Not on file  Tobacco Use  . Smoking status: Former Smoker    Packs/day:  0.50    Years: 38.00    Pack years: 19.00    Types: Cigarettes    Last attempt to quit: 2009    Years since quitting: 10.2  . Smokeless tobacco: Never Used  Substance and Sexual Activity  . Alcohol use: No    Comment: Quit in 4/18  . Drug use: No  . Sexual activity: Not Currently  Lifestyle  . Physical activity:    Days per week: Not on file    Minutes per session: Not on file  . Stress: Not on file  Relationships  . Social connections:    Talks on phone: Not on file    Gets together: Not on file    Attends religious service: Not on file    Active member of club or organization: Not on file    Attends meetings of clubs or organizations: Not on file    Relationship status: Not on file  . Intimate partner violence:    Fear of current or ex partner: Not on file    Emotionally abused: Not on file    Physically abused: Not on file    Forced sexual activity: Not on file  Other Topics Concern  . Not on file  Social History Narrative  . Not on file     Physical Exam: BP (!) 140/92   Pulse 82   Ht 5\' 11"  (1.803 m)   Wt 205 lb (93 kg)   BMI 28.59 kg/m  Constitutional: Chronically ill-appearing Psychiatric: alert and oriented x3 Eyes: extraocular movements intact Mouth: oral pharynx moist, no lesions Neck: supple no lymphadenopathy Cardiovascular: heart regular rate and rhythm Lungs: clear to auscultation bilaterally Abdomen: soft, nontender, nondistended, no obvious ascites, no peritoneal signs, normal bowel sounds; midline hernia that easily reduces Extremities: 1-2+ bilateral lower extremity edema with chronic stasis changes noted especially on his right leg Skin: no lesions on visible extremities   Assessment and plan: 67 y.o. male with cirrhosis, symptomatic midline hernia  First I do not recommend any type of elective surgery for his midline hernia until we better understand his cirrhosis and get him on a stable medical regimen.  Indeed his hernia may be less  uncomfortable if his fluid status is under better control  Second he clearly has cirrhosis.  I suspect this is from previous alcoholism and also hepatitis C.  Interestingly he is not aware that he had hepatitis C however it was tested when he was an inpatient about a year ago.  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  I think he had spontaneous bacterial peritonitis last summer.  Clearly he has decompensated cirrhosis.  I need to restage his liver  disease with meld score labs, CBC.  Plus he has never been evaluated for other potential causes of cirrhosis and so I am asking that he undergo a battery of blood test.  See those summarized below.  After all of his labs are back I will advise him on diuretic dosing.  Likely I will start with Aldactone 100 mg once daily and Lasix 40 mg once daily.  I will do a bit more research and if he qualifies he may need chronic antibiotics for previous history of spontaneous bacterial peritonitis.  I advised him he should cut down to 0 alcohol.  I advised him to have a low-salt diet overall.  Pending his lab tests and the way he response clinically over the next 3 or 4 months I may refer him for hepatitis C treatment to the infectious disease clinic.   Please see the "Patient Instructions" section for addition details about the plan.   Owens Loffler, MD [facility] 04/30/2017, 11:23 AM  Cc: Annye English, MD

## 2017-04-30 NOTE — Patient Instructions (Addendum)
You will have labs checked today in the basement lab.  Please head down after you check out with the front desk  (  You will get labs drawn today:  Hepatitis A (IgM and IgG), Hepatitis B surface antigen, Hepatitis B surface antibody, Hepatitis C viral load, Hepatitis C Genotype, total iron, ferritin, TIBC, ANA, AMA, alphafeto protein (AFP), anti smooth muscle antibody, alpha 1 antitrypsin, cerulloplasm,CBC, CMET, INR.  Liver US for hepatoma screening.  It is important that you have a relatively low salt diet.  High salt diet can cause fluid to accumulate in your legs, abdomen and even around your lungs. You should try to avoid NSAID type over the counter pain medicines as best as possible. Tylenol is safe to take for 'routine' aches and pains, but never take more than 1/2 the dose suggested on the package instructions (never more than 2 grams per day).  It is extremely important that you completely alcohol.   Please return to see Dr. Ardis Hughs in 2 months.  You have been scheduled for an abdominal ultrasound at [facility] (1st floor of hospital) on 05/03/17 at 10:30am. Please arrive 15 minutes prior to your appointment for registration. Make certain not to have anything to eat or drink 6 hours prior to your appointment. Should you need to reschedule your appointment, please contact radiology at [phone]. This test typically takes about 30 minutes to perform.

## 2017-05-03 ENCOUNTER — Ambulatory Visit (HOSPITAL_COMMUNITY)
Admission: RE | Admit: 2017-05-03 | Discharge: 2017-05-03 | Disposition: A | Discharge status: Home / Self Care | Payer: Medicare HMO | Source: Ambulatory Visit | Attending: Gastroenterology | Admitting: Gastroenterology

## 2017-05-03 DIAGNOSIS — K7460 Unspecified cirrhosis of liver: Secondary | ICD-10-CM | POA: Diagnosis not present

## 2017-05-03 DIAGNOSIS — J90 Pleural effusion, not elsewhere classified: Secondary | ICD-10-CM | POA: Diagnosis not present

## 2017-05-03 DIAGNOSIS — R188 Other ascites: Secondary | ICD-10-CM | POA: Insufficient documentation

## 2017-05-10 LAB — HCV RNA,QUANTITATIVE REAL TIME PCR
HCV Quantitative Log: <1.18 Log IU/mL
HCV RNA, PCR, QN: <15 IU/mL

## 2017-05-10 LAB — HEPATITIS B SURFACE ANTIBODY,QUALITATIVE: Hep B S Ab: REACTIVE — AB

## 2017-05-10 LAB — EXTRA LAV TOP TUBE

## 2017-05-10 LAB — HEPATITIS C VIRAL RNA NS3 GENOTYPE: HCV NS3 SUBTYPE: NOT DETECTED

## 2017-05-10 LAB — HEPATITIS C ANTIBODY
Hepatitis C Ab: REACTIVE — AB
SIGNAL TO CUT-OFF: 32 — AB (ref <1.00)

## 2017-05-10 LAB — HEPATITIS A ANTIBODY, TOTAL: Hepatitis A AB,Total: REACTIVE — AB

## 2017-05-10 LAB — ANA: ANA: NEGATIVE

## 2017-05-10 LAB — AFP TUMOR MARKER: AFP-Tumor Marker: 3.4 ng/mL (ref <6.1)

## 2017-05-10 LAB — HEPATITIS C GENOTYPE: HCV GENOTYPE: NOT DETECTED

## 2017-05-10 LAB — ANTI-SMOOTH MUSCLE ANTIBODY, IGG: Actin (Smooth Muscle) Antibody (IGG): 21 U — ABNORMAL HIGH (ref <20)

## 2017-05-10 LAB — ALPHA-1-ANTITRYPSIN: A1 ANTITRYPSIN SER: 129 mg/dL (ref 83–199)

## 2017-05-10 LAB — HEPATITIS B SURFACE ANTIGEN: Hepatitis B Surface Ag: NONREACTIVE

## 2017-05-10 LAB — MITOCHONDRIAL ANTIBODIES: Mitochondrial M2 Ab, IgG: <=20 U

## 2017-05-10 LAB — CERULOPLASMIN: Ceruloplasmin: 28 mg/dL (ref 18–36)

## 2017-05-28 ENCOUNTER — Emergency Department (HOSPITAL_COMMUNITY)
Admission: EM | Admit: 2017-05-28 | Discharge: 2017-05-28 | Disposition: A | Discharge status: Home / Self Care | Payer: Medicare HMO | Attending: Emergency Medicine | Admitting: Emergency Medicine

## 2017-05-28 ENCOUNTER — Encounter (HOSPITAL_COMMUNITY): Payer: Self-pay

## 2017-05-28 DIAGNOSIS — I10 Essential (primary) hypertension: Secondary | ICD-10-CM | POA: Insufficient documentation

## 2017-05-28 DIAGNOSIS — Z87891 Personal history of nicotine dependence: Secondary | ICD-10-CM | POA: Insufficient documentation

## 2017-05-28 DIAGNOSIS — K429 Umbilical hernia without obstruction or gangrene: Secondary | ICD-10-CM | POA: Insufficient documentation

## 2017-05-28 DIAGNOSIS — Z8719 Personal history of other diseases of the digestive system: Secondary | ICD-10-CM | POA: Diagnosis not present

## 2017-05-28 DIAGNOSIS — E119 Type 2 diabetes mellitus without complications: Secondary | ICD-10-CM | POA: Diagnosis not present

## 2017-05-28 DIAGNOSIS — R109 Unspecified abdominal pain: Secondary | ICD-10-CM | POA: Diagnosis present

## 2017-05-28 DIAGNOSIS — L309 Dermatitis, unspecified: Secondary | ICD-10-CM | POA: Insufficient documentation

## 2017-05-28 DIAGNOSIS — R188 Other ascites: Secondary | ICD-10-CM | POA: Diagnosis not present

## 2017-05-28 LAB — CBC
HCT: 26.2 % — ABNORMAL LOW (ref 39.0–52.0)
HEMOGLOBIN: 7.8 g/dL — AB (ref 13.0–17.0)
MCH: 21.7 pg — AB (ref 26.0–34.0)
MCHC: 29.8 g/dL — AB (ref 30.0–36.0)
MCV: 73 fL — ABNORMAL LOW (ref 78.0–100.0)
Platelets: 64 10*3/uL — ABNORMAL LOW (ref 150–400)
RBC: 3.59 MIL/uL — ABNORMAL LOW (ref 4.22–5.81)
RDW: 22.5 % — ABNORMAL HIGH (ref 11.5–15.5)
WBC: 4.6 10*3/uL (ref 4.0–10.5)

## 2017-05-28 LAB — COMPREHENSIVE METABOLIC PANEL
ALBUMIN: 2.2 g/dL — AB (ref 3.5–5.0)
ALK PHOS: 89 U/L (ref 38–126)
ALT: 21 U/L (ref 17–63)
AST: 45 U/L — AB (ref 15–41)
Anion gap: 5 (ref 5–15)
BUN: 9 mg/dL (ref 6–20)
CALCIUM: 7.9 mg/dL — AB (ref 8.9–10.3)
CO2: 24 mmol/L (ref 22–32)
CREATININE: 0.88 mg/dL (ref 0.61–1.24)
Chloride: 105 mmol/L (ref 101–111)
GFR calc Af Amer: >60 mL/min (ref >60)
GFR calc non Af Amer: >60 mL/min (ref >60)
GLUCOSE: 88 mg/dL (ref 65–99)
Potassium: 3.6 mmol/L (ref 3.5–5.1)
SODIUM: 134 mmol/L — AB (ref 135–145)
Total Bilirubin: 1.5 mg/dL — ABNORMAL HIGH (ref 0.3–1.2)
Total Protein: 6.1 g/dL — ABNORMAL LOW (ref 6.5–8.1)

## 2017-05-28 MED ORDER — OXYCODONE-ACETAMINOPHEN 5-325 MG PO TABS
1.0000 | ORAL_TABLET | Freq: Once | ORAL | Status: AC
  Administered 2017-05-28: 1 via ORAL
  Filled 2017-05-28: qty 1

## 2017-05-28 MED ORDER — DOXYCYCLINE HYCLATE 100 MG PO CAPS: 100.0000 mg | ORAL_CAPSULE | Freq: Two times a day (BID) | ORAL | 0 refills | Status: DC

## 2017-05-28 MED ORDER — EUCERIN EX LOTN: TOPICAL_LOTION | CUTANEOUS | 0 refills | Status: DC | PRN

## 2017-05-28 NOTE — Discharge Instructions (Addendum)
We saw you in the ER for abdominal pain. We spoke with our general surgery team, who informed us that you saw 1 of their partners in the clinic.  They asked you to be seen by GI doctors, who have not cleared you for your surgery.  Please see the GI doctor for your ascites.  I suspect that having fluid around your abdomen is likely making your symptoms worse, and you might need some of the fluid drained.  Return to the ER immediately if your symptoms get worse (pain gets worse, you are unable to reduce the hernia, he started having fevers or bloody stools).

## 2017-05-28 NOTE — ED Triage Notes (Signed)
Patient complains of ongoing umbilical hernia pain for 2 months, states that the past week the pain is worse and staying popped out most of the time now, patient alert and oriented, no problems passing stool. No fever, no nausea , no vomiting

## 2017-05-28 NOTE — ED Notes (Signed)
Patient asked for antibiotic for right leg.

## 2017-05-28 NOTE — ED Notes (Signed)
Doctor wrote prescriptions for patient.

## 2017-05-28 NOTE — ED Notes (Signed)
Doctor at bedside. Ordering pain medication before discharge.

## 2017-05-28 NOTE — ED Provider Notes (Signed)
[facility] EMERGENCY DEPARTMENT Provider Note   CSN: [id number] Arrival date & time: 05/28/17  0831     History   Chief Complaint No chief complaint on file.   HPI [name] is a 67 y.o. male.  HPI  67 year old male comes in with chief complaint of abdominal hernia and abdominal pain.  Patient has history of liver cirrhosis, diabetes and umbilical hernia.  He states that over the past 2 months his hernia has gotten larger and it is also started giving him some pain.  Recently he has noted skin color change around the site of hernia which prompted him to come to the ER.  Patient denies any nausea, vomiting, fevers, chills.  Patient also complains of right lower extremity redness and pain which is new.  Past Medical History:  Diagnosis Date  . Abdominal hernia   . Arthritis    "hands" (02/16/2016)  . Bright red rectal bleeding    "@ least 1-2 times/month; sometimes more" (02/16/2016)  . Cirrhosis ([place])   . Hypertension   . Type II diabetes mellitus [facility])     Patient Active Problem List   Diagnosis Date Noted  . SIRS (systemic inflammatory response syndrome) ([place]) 08/14/2016  . Prolonged QT interval 08/14/2016  . Anxiety 03/06/2016  . Insomnia 03/06/2016  . Hypertension 03/06/2016  . Hepatitis C antibody test positive 02/18/2016  . Thrombocytopenia ([place]) 02/16/2016  . Normochromic normocytic anemia 02/16/2016  . Alcohol abuse 02/16/2016  . Cirrhosis of liver ([place]) 02/16/2016    Past Surgical History:  Procedure Laterality Date  . COLONOSCOPY N/A 02/17/2016   Procedure: COLONOSCOPY;  Surgeon: [name], MD;  Location: [facility] ENDOSCOPY;  Service: Endoscopy;  Laterality: N/A;  . ESOPHAGOGASTRODUODENOSCOPY N/A 02/17/2016   Procedure: ESOPHAGOGASTRODUODENOSCOPY (EGD);  Surgeon: [name], MD;  Location: [facility] ENDOSCOPY;  Service: Endoscopy;  Laterality: N/A;  . FRACTURE SURGERY    . IR PARACENTESIS  08/14/2016  . [place]   "has  steel rods in it"        Home Medications    Prior to Admission medications   Medication Sig Start Date End Date Taking? Authorizing Provider  doxycycline (VIBRAMYCIN) 100 MG capsule Take 1 capsule (100 mg total) by mouth 2 (two) times daily. 05/28/17   Varney Biles, MD  Emollient (EUCERIN) lotion Apply topically as needed for dry skin. 05/28/17   Varney Biles, MD    Family History Family History  Problem Relation Age of Onset  . Hypertension Other     Social History Social History   Tobacco Use  . Smoking status: Former Smoker    Packs/day: 0.50    Years: 38.00    Pack years: 19.00    Types: Cigarettes    Last attempt to quit: 2009    Years since quitting: 10.3  . Smokeless tobacco: Never Used  Substance Use Topics  . Alcohol use: No    Comment: Quit in 4/18  . Drug use: No     Allergies   Patient has no known allergies.   Review of Systems Review of Systems  Constitutional: Positive for activity change.  Respiratory: Negative for shortness of breath.   Cardiovascular: Positive for leg swelling. Negative for chest pain.  Gastrointestinal: Positive for abdominal pain.  Skin: Positive for rash.  Allergic/Immunologic: Positive for immunocompromised state.  Hematological: Bruises/bleeds easily.  All other systems reviewed and are negative.    Physical Exam Updated Vital Signs BP (!) 155/90   Pulse 67  Temp 98.1 F (36.7 C) (Oral)   Resp 16   SpO2 100%   Physical Exam  Constitutional: He is oriented to person, place, and time. He appears well-developed.  HENT:  Head: Atraumatic.  Neck: Neck supple.  Cardiovascular: Normal rate.  Pulmonary/Chest: Effort normal.  Abdominal: Soft. There is tenderness.  Patient has a reducible umbilical hernia.  There is some erythema overlying the site of the hernia  Neurological: He is alert and oriented to person, place, and time.  Skin: Skin is warm. Rash noted.  Bilateral lower extremity pitting edema and  skin hypertrophy.  Patient has erythema and calor along with dolor of the right lower extremity.  Nursing note and vitals reviewed.    ED Treatments / Results  Labs (all labs ordered are listed, but only abnormal results are displayed) Labs Reviewed  COMPREHENSIVE METABOLIC PANEL - Abnormal; Notable for the following components:      Result Value   Sodium 134 (*)    Calcium 7.9 (*)    Total Protein 6.1 (*)    Albumin 2.2 (*)    AST 45 (*)    Total Bilirubin 1.5 (*)    All other components within normal limits  CBC - Abnormal; Notable for the following components:   RBC 3.59 (*)    Hemoglobin 7.8 (*)    HCT 26.2 (*)    MCV 73.0 (*)    MCH 21.7 (*)    MCHC 29.8 (*)    RDW 22.5 (*)    Platelets 64 (*)    All other components within normal limits    EKG None  Radiology No results found.  Procedures Procedures (including critical care time)  Medications Ordered in ED Medications  oxyCODONE-acetaminophen (PERCOCET/ROXICET) 5-325 MG per tablet 1 tablet (1 tablet Oral Given 05/28/17 1450)     Initial Impression / Assessment and Plan / ED Course  I have reviewed the triage vital signs and the nursing notes.  Pertinent labs & imaging results that were available during my care of the patient were reviewed by me and considered in my medical decision making (see chart for details).     67 year old male comes in with chief complaint of abdominal pain.  He has known history of umbilical hernia which appears to be getting worse.  Patient has history of liver cirrhosis as well for which he sees the GI doctors.  Labs indicated worsening anemia, persistent thrombocytopenia.  Both of them are likely sequelae of his liver cirrhosis.  Although patient does not have any incarcerated hernia at this time, does appear that his hernia is giving him more problems over time.  Some of it is likely because of his underlying ascites, other etiology could be just worsening of the hernia itself.   I thought it was prudent for [place] to make sure that patient gets a good follow-up with the surgery team.  Given his abnormal labs and significant medical history I spoke with the surgery team to see if they wanted to see the patient and decide if he needs to be admitted versus close follow-up for the medical clearance.  I spoke with Maximiano Coss for general surgery.  She reviewed patient's chart and discussed the case with the attending surgeon.  She call me back informing that the patient actually had been seen by surgery at some point and they had wanted patient to see the GI doctor for medical clearance.  The surgery team had received an email from the GI team stating that  patient was not cleared for surgery at this time and that they were going to try and control his liver cirrhosis/hepatitis better.  Given that information we have advised patient to follow-up with the GI team.  Additionally, patient likely has stasis dermatitis in his lower extremity.  Given the new redness, pain, swelling we will give him doxycycline for wait and watch approach.  Strict ER return precautions for incarcerated hernia discussed.  Final Clinical Impressions(s) / ED Diagnoses   Final diagnoses:  Umbilical hernia without obstruction and without gangrene  Ascites of liver  Dermatitis    ED Discharge Orders        Ordered    doxycycline (VIBRAMYCIN) 100 MG capsule  2 times daily     05/28/17 1518    Emollient (EUCERIN) lotion  As needed     05/28/17 1518       Varney Biles, MD 05/28/17 1524

## 2017-05-29 ENCOUNTER — Telehealth: Payer: Self-pay | Admitting: Gastroenterology

## 2017-05-29 DIAGNOSIS — K7460 Unspecified cirrhosis of liver: Secondary | ICD-10-CM

## 2017-05-29 NOTE — Telephone Encounter (Signed)
Patient states he is still having abd pain and was recently seen at the hosp for a hernia. Patient would like some advice. Pt last seen 4.8.19.

## 2017-05-29 NOTE — Telephone Encounter (Signed)
Left message on machine to call back  

## 2017-05-30 MED ORDER — SULFAMETHOXAZOLE-TRIMETHOPRIM 800-160 MG PO TABS: 1.0000 | ORAL_TABLET | Freq: Every day | ORAL | 6 refills | Status: AC

## 2017-05-30 MED ORDER — SPIRONOLACTONE 100 MG PO TABS: 100.0000 mg | ORAL_TABLET | Freq: Every day | ORAL | 6 refills | Status: DC

## 2017-05-30 MED ORDER — FUROSEMIDE 40 MG PO TABS: 40.0000 mg | ORAL_TABLET | Freq: Every day | ORAL | 6 refills | Status: DC

## 2017-05-30 NOTE — Telephone Encounter (Signed)
The pt has been advised and will begin iron, bactrim, aldactone and lasix as recommended.  Lab order in Epic, follow up has been made.  He will call with any further concerns or questions.

## 2017-05-30 NOTE — Telephone Encounter (Signed)
Notes recorded by Milus Banister, MD on 05/15/2017 at 7:27 AM EDT   Please call the patient. Labs show no clear cause of his cirrhosis. Hep C Ab + but no detectable virus. Immune to A/B. For now, will assume his cirrhosis is due to previous etoh abuse. He is anemic, microcytic. Needs to start one OTC iron pill once daily 325mg . He also needs to start aldactone 100mg  once daily, lasix 40mg  once daily for ascites. Also needs bactrim DS once daily for SBP prophylaxis (has one well documented episode of SBP in past). He needs bmet in 2 weeks, ROV with me next available. US showed no sign of cancer, tumors.

## 2017-05-30 NOTE — Telephone Encounter (Signed)
Patient is asking for antibiotics to be sent to [facility] on [address]. Pt is also asking if we can send in pain medicaiton for him best call back # [phone].

## 2017-05-30 NOTE — Telephone Encounter (Signed)
The pt has been advised that his prescriptions were sent to [facility] [address] and that he will need to cal his PCP for any pain meds.

## 2017-08-03 ENCOUNTER — Ambulatory Visit: Payer: Medicare HMO | Admitting: Gastroenterology

## 2017-08-05 ENCOUNTER — Emergency Department (HOSPITAL_COMMUNITY): Payer: Medicare HMO

## 2017-08-05 ENCOUNTER — Other Ambulatory Visit: Payer: Self-pay

## 2017-08-05 ENCOUNTER — Emergency Department (HOSPITAL_COMMUNITY)
Admission: EM | Admit: 2017-08-05 | Discharge: 2017-08-05 | Disposition: A | Discharge status: Home / Self Care | Payer: Medicare HMO | Attending: Emergency Medicine | Admitting: Emergency Medicine

## 2017-08-05 DIAGNOSIS — Z79899 Other long term (current) drug therapy: Secondary | ICD-10-CM | POA: Insufficient documentation

## 2017-08-05 DIAGNOSIS — E119 Type 2 diabetes mellitus without complications: Secondary | ICD-10-CM | POA: Insufficient documentation

## 2017-08-05 DIAGNOSIS — D649 Anemia, unspecified: Secondary | ICD-10-CM | POA: Diagnosis not present

## 2017-08-05 DIAGNOSIS — Z87891 Personal history of nicotine dependence: Secondary | ICD-10-CM | POA: Diagnosis not present

## 2017-08-05 DIAGNOSIS — I10 Essential (primary) hypertension: Secondary | ICD-10-CM | POA: Diagnosis not present

## 2017-08-05 DIAGNOSIS — R1033 Periumbilical pain: Secondary | ICD-10-CM | POA: Insufficient documentation

## 2017-08-05 DIAGNOSIS — R109 Unspecified abdominal pain: Secondary | ICD-10-CM | POA: Diagnosis present

## 2017-08-05 DIAGNOSIS — K439 Ventral hernia without obstruction or gangrene: Secondary | ICD-10-CM | POA: Insufficient documentation

## 2017-08-05 LAB — COMPREHENSIVE METABOLIC PANEL
ALBUMIN: 2.5 g/dL — AB (ref 3.5–5.0)
ALK PHOS: 78 U/L (ref 38–126)
ALT: 28 U/L (ref 0–44)
AST: 62 U/L — AB (ref 15–41)
Anion gap: 7 (ref 5–15)
CALCIUM: 8.1 mg/dL — AB (ref 8.9–10.3)
CO2: 24 mmol/L (ref 22–32)
Chloride: 108 mmol/L (ref 98–111)
Creatinine, Ser: 0.83 mg/dL (ref 0.61–1.24)
GFR calc Af Amer: >60 mL/min (ref >60)
GFR calc non Af Amer: >60 mL/min (ref >60)
GLUCOSE: 95 mg/dL (ref 70–99)
Potassium: 3.9 mmol/L (ref 3.5–5.1)
Sodium: 139 mmol/L (ref 135–145)
TOTAL PROTEIN: 6.3 g/dL — AB (ref 6.5–8.1)
Total Bilirubin: 1.9 mg/dL — ABNORMAL HIGH (ref 0.3–1.2)

## 2017-08-05 LAB — CBC
HCT: 26.5 % — ABNORMAL LOW (ref 39.0–52.0)
Hemoglobin: 7.5 g/dL — ABNORMAL LOW (ref 13.0–17.0)
MCH: 20.9 pg — ABNORMAL LOW (ref 26.0–34.0)
MCHC: 28.3 g/dL — ABNORMAL LOW (ref 30.0–36.0)
MCV: 73.8 fL — AB (ref 78.0–100.0)
Platelets: 60 10*3/uL — ABNORMAL LOW (ref 150–400)
RBC: 3.59 MIL/uL — ABNORMAL LOW (ref 4.22–5.81)
RDW: 22.9 % — ABNORMAL HIGH (ref 11.5–15.5)
WBC: 3.9 10*3/uL — AB (ref 4.0–10.5)

## 2017-08-05 LAB — LIPASE, BLOOD: Lipase: 31 U/L (ref 11–51)

## 2017-08-05 MED ORDER — TRAMADOL HCL 50 MG PO TABS: 50.0000 mg | ORAL_TABLET | Freq: Four times a day (QID) | ORAL | 0 refills | Status: AC | PRN

## 2017-08-05 MED ORDER — TRAMADOL HCL 50 MG PO TABS
50.0000 mg | ORAL_TABLET | Freq: Once | ORAL | Status: AC
  Administered 2017-08-05: 50 mg via ORAL
  Filled 2017-08-05: qty 1

## 2017-08-05 NOTE — ED Triage Notes (Addendum)
Patient called EMS for his "hernia" pain that has been worsening over the last year. States that he has had 6 beers today. Patient states that he has Hep C and cirrhosis of the liver

## 2017-08-05 NOTE — Discharge Instructions (Addendum)
You were seen today for abdominal pain.  Follow-up closely with your GI doctor.  Continue your fluid pills and iron pills as directed.  You will be given a short course of tramadol.  If you develop vomiting, worsening pain, inability to have a bowel movement you should be reevaluated immediately.

## 2017-08-05 NOTE — ED Provider Notes (Signed)
[facility] EMERGENCY DEPARTMENT Provider Note   CSN: [id number] Arrival date & time: 08/05/17  0201     History   Chief Complaint Chief Complaint  Patient presents with  . Abdominal Pain    HPI [name] Check is a 67 y.o. male.  HPI  This is a 67 year old male with a history of abdominal hernia, cirrhosis, hypertension, diabetes who presents with abdominal pain.  Patient reports intermittent worsening abdominal pain over the last several months which he relates to his hernia.  He states "it pops out for a few hours and then I have sharp pain.  It is nonradiating.  Currently he rates his pain at 10 out of 10.  He has not taken anything at home for the pain.  He denies any nausea or vomiting.  He has had normal bowel movements.  Denies any fevers.  He reports that he saw gastroenterology approximately 1 month ago.  He reports compliance with his diuretic medication and iron pills.  He continues to drink alcohol.  Past Medical History:  Diagnosis Date  . Abdominal hernia   . Arthritis    "hands" (02/16/2016)  . Bright red rectal bleeding    "@ least 1-2 times/month; sometimes more" (02/16/2016)  . Cirrhosis ([place])   . Hypertension   . Type II diabetes mellitus [facility])     Patient Active Problem List   Diagnosis Date Noted  . SIRS (systemic inflammatory response syndrome) ([place]) 08/14/2016  . Prolonged QT interval 08/14/2016  . Anxiety 03/06/2016  . Insomnia 03/06/2016  . Hypertension 03/06/2016  . Hepatitis C antibody test positive 02/18/2016  . Thrombocytopenia ([place]) 02/16/2016  . Normochromic normocytic anemia 02/16/2016  . Alcohol abuse 02/16/2016  . Cirrhosis of liver ([place]) 02/16/2016    Past Surgical History:  Procedure Laterality Date  . COLONOSCOPY N/A 02/17/2016   Procedure: COLONOSCOPY;  Surgeon: [name], MD;  Location: [facility] ENDOSCOPY;  Service: Endoscopy;  Laterality: N/A;  . ESOPHAGOGASTRODUODENOSCOPY N/A 02/17/2016   Procedure:  ESOPHAGOGASTRODUODENOSCOPY (EGD);  Surgeon: [name], MD;  Location: [facility] ENDOSCOPY;  Service: Endoscopy;  Laterality: N/A;  . FRACTURE SURGERY    . IR PARACENTESIS  08/14/2016  . [place]   "has steel rods in it"        Home Medications    Prior to Admission medications   Medication Sig Start Date End Date Taking? Authorizing Provider  furosemide (LASIX) 40 MG tablet Take 1 tablet (40 mg total) by mouth daily. 05/30/17 08/05/25 Yes Milus Banister, MD  spironolactone (ALDACTONE) 100 MG tablet Take 1 tablet (100 mg total) by mouth daily. 05/30/17 08/05/25 Yes Milus Banister, MD  doxycycline (VIBRAMYCIN) 100 MG capsule Take 1 capsule (100 mg total) by mouth 2 (two) times daily. Patient not taking: Reported on 08/05/2017 05/28/17   Varney Biles, MD  Emollient (EUCERIN) lotion Apply topically as needed for dry skin. Patient not taking: Reported on 08/05/2017 05/28/17   Varney Biles, MD  traMADol (ULTRAM) 50 MG tablet Take 1 tablet (50 mg total) by mouth every 6 (six) hours as needed for up to 3 days. 08/05/17 08/08/17  Horton, Barbette Hair, MD    Family History Family History  Problem Relation Age of Onset  . Hypertension Other     Social History Social History   Tobacco Use  . Smoking status: Former Smoker    Packs/day: 0.50    Years: 38.00    Pack years: 19.00    Types: Cigarettes  Last attempt to quit: 2009    Years since quitting: 10.5  . Smokeless tobacco: Never Used  Substance Use Topics  . Alcohol use: No    Comment: Quit in 4/18  . Drug use: No     Allergies   Patient has no known allergies.   Review of Systems Review of Systems  Constitutional: Negative for fever.  Respiratory: Negative for shortness of breath.   Cardiovascular: Negative for chest pain.  Gastrointestinal: Positive for abdominal pain. Negative for blood in stool, constipation, nausea and vomiting.  Genitourinary: Negative for dysuria.  Musculoskeletal: Negative for back  pain.  Skin: Positive for color change.  Neurological: Negative for dizziness and headaches.  All other systems reviewed and are negative.    Physical Exam Updated Vital Signs BP (!) 145/91 (BP Location: Left Arm)   Pulse 62   Temp 97.6 F (36.4 C) (Oral)   Resp 15   Ht 5\' 11"  (1.803 m)   Wt 86.2 kg (190 lb)   SpO2 99%   BMI 26.50 kg/m   Physical Exam  Constitutional: He is oriented to person, place, and time.  Chronically ill-appearing, no acute distress  HENT:  Head: Normocephalic and atraumatic.  Eyes: Pupils are equal, round, and reactive to light.  Slight scleral icterus noted  Neck: Neck supple.  Cardiovascular: Normal rate, regular rhythm and normal heart sounds.  No murmur heard. Pulmonary/Chest: Effort normal and breath sounds normal. No respiratory distress. He has no wheezes.  Abdominal: Soft. Bowel sounds are normal. He exhibits distension. There is no tenderness. There is no rebound.  Slight distention with ventral hernia palpated, easily reducible, no overlying skin changes, nontender, no rebound or guarding  Musculoskeletal: He exhibits edema.  2+ pitting edema bilaterally  Lymphadenopathy:    He has no cervical adenopathy.  Neurological: He is alert and oriented to person, place, and time.  Skin: Skin is warm and dry.  Chronic venous stasis changes bilateral lower extremities  Psychiatric: He has a normal mood and affect.  Nursing note and vitals reviewed.    ED Treatments / Results  Labs (all labs ordered are listed, but only abnormal results are displayed) Labs Reviewed  COMPREHENSIVE METABOLIC PANEL - Abnormal; Notable for the following components:      Result Value   BUN <5 (*)    Calcium 8.1 (*)    Total Protein 6.3 (*)    Albumin 2.5 (*)    AST 62 (*)    Total Bilirubin 1.9 (*)    All other components within normal limits  CBC - Abnormal; Notable for the following components:   WBC 3.9 (*)    RBC 3.59 (*)    Hemoglobin 7.5 (*)    HCT  26.5 (*)    MCV 73.8 (*)    MCH 20.9 (*)    MCHC 28.3 (*)    RDW 22.9 (*)    Platelets 60 (*)    All other components within normal limits  LIPASE, BLOOD    EKG None  Radiology Dg Abdomen Acute W/chest  Result Date: 08/05/2017 CLINICAL DATA:  Hernia and abdominal pain. EXAM: DG ABDOMEN ACUTE W/ 1V CHEST COMPARISON:  None. FINDINGS: There is no evidence of dilated bowel loops or free intraperitoneal air. No radiopaque calculi or other significant radiographic abnormality is seen. Heart size and mediastinal contours are within normal limits. Small right pleural effusion. Bilateral sacroiliac joint lag screws. IMPRESSION: Negative abdominal radiographs.  Small right pleural effusion. Electronically Signed   By:  Ulyses Jarred M.D.   On: 08/05/2017 06:21    Procedures Procedures (including critical care time)  Medications Ordered in ED Medications  traMADol (ULTRAM) tablet 50 mg (50 mg Oral Given 08/05/17 0551)     Initial Impression / Assessment and Plan / ED Course  I have reviewed the triage vital signs and the nursing notes.  Pertinent labs & imaging results that were available during my care of the patient were reviewed by me and considered in my medical decision making (see chart for details).     Patient presents with acute on chronic abdominal pain.  Hernia notable on exam but no evidence of string elation or incarceration.  He is hemodynamically stable and vital signs are reassuring.  There is no significant tenderness on exam.  Lab work reviewed from triage.  Labs are mostly at baseline.  His hemoglobin continues to trend downward.  Currently 7.5.  Has been trending down and most recent was around 8.  He has a preserved hematocrit.  No known history of heart disease.  He reports that he is taking his iron by his GI doctor.  He also reports compliance with his diuretic.  X-ray obtained and shows no evidence of obstruction.  Patient has limited options regarding pain management  given his known liver disease.  Recommend close follow-up with his GI doctor.  Continue medications as prescribed.  He is otherwise asymptomatic regarding his anemia, no indication for transfusion at this time.  He is not a candidate at this time for hernia repair given his cirrhosis.  He was advised to stop drinking alcohol.  He will be sent home with a short course of tramadol for pain.  After history, exam, and medical workup I feel the patient has been appropriately medically screened and is safe for discharge home. Pertinent diagnoses were discussed with the patient. Patient was given return precautions.   Final Clinical Impressions(s) / ED Diagnoses   Final diagnoses:  Periumbilical abdominal pain  Ventral hernia without obstruction or gangrene  Chronic anemia    ED Discharge Orders        Ordered    traMADol (ULTRAM) 50 MG tablet  Every 6 hours PRN     08/05/17 0642       Merryl Hacker, MD 08/05/17 [phone]

## 2017-08-21 ENCOUNTER — Encounter: Payer: Medicare HMO | Admitting: Internal Medicine
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
## 2017-09-27 ENCOUNTER — Encounter: Payer: Self-pay | Admitting: Family

## 2017-09-27 ENCOUNTER — Ambulatory Visit (INDEPENDENT_AMBULATORY_CARE_PROVIDER_SITE_OTHER): Payer: Medicare HMO | Admitting: Family

## 2017-09-27 VITALS — BP 124/81 | HR 73 | Temp 98.0°F | Ht 71.0 in | Wt 195.0 lb

## 2017-09-27 DIAGNOSIS — R768 Other specified abnormal immunological findings in serum: Secondary | ICD-10-CM

## 2017-09-27 NOTE — Patient Instructions (Signed)
Nice to meet you.   Your hepatitis C antibody test was positive, however you do not have a viral load present indicating that you do not need treatment. You are likely part of the 25% that resolved the virus independently.   This Hepatitis C antibody test will always be positive.   Recommend continued abstinence from alcohol and limit your Tylenol usage.  Continue to follow up with Dr. Rex Kras and Dr. Ardis Hughs.  We are happy to see you back as needed.

## 2017-09-27 NOTE — Assessment & Plan Note (Signed)
[name] has a positive Hepatitis C antibody test with follow up testing showing no viral load indicating that he is likely in the 25% that clear the virus independent of treatment. He does not require any treatment. We discussed the risk factors and methods of transmissions in detail and that he would have to be re-infected for problems to develop from Hepatitis C. Recommend continued follow up with Dr. Ardis Hughs for cirrhosis. We are happy to see him back as needed.

## 2018-01-04 ENCOUNTER — Other Ambulatory Visit: Payer: Self-pay | Admitting: Gastroenterology

## 2018-01-19 ENCOUNTER — Emergency Department (HOSPITAL_COMMUNITY)
Admission: EM | Admit: 2018-01-19 | Discharge: 2018-01-19 | Disposition: A | Discharge status: Home / Self Care | Payer: Medicare HMO | Attending: Emergency Medicine | Admitting: Emergency Medicine

## 2018-01-19 DIAGNOSIS — I10 Essential (primary) hypertension: Secondary | ICD-10-CM | POA: Diagnosis not present

## 2018-01-19 DIAGNOSIS — K469 Unspecified abdominal hernia without obstruction or gangrene: Secondary | ICD-10-CM | POA: Diagnosis present

## 2018-01-19 DIAGNOSIS — Z87891 Personal history of nicotine dependence: Secondary | ICD-10-CM | POA: Diagnosis not present

## 2018-01-19 DIAGNOSIS — E119 Type 2 diabetes mellitus without complications: Secondary | ICD-10-CM | POA: Insufficient documentation

## 2018-01-19 LAB — CBC WITH DIFFERENTIAL/PLATELET
Abs Immature Granulocytes: 0.01 10*3/uL (ref 0.00–0.07)
BASOS ABS: 0.1 10*3/uL (ref 0.0–0.1)
Basophils Relative: 1 %
Eosinophils Absolute: 0.1 10*3/uL (ref 0.0–0.5)
Eosinophils Relative: 2 %
HEMATOCRIT: 34.3 % — AB (ref 39.0–52.0)
HEMOGLOBIN: 10 g/dL — AB (ref 13.0–17.0)
IMMATURE GRANULOCYTES: 0 %
LYMPHS ABS: 1.2 10*3/uL (ref 0.7–4.0)
LYMPHS PCT: 27 %
MCH: 22.2 pg — ABNORMAL LOW (ref 26.0–34.0)
MCHC: 29.2 g/dL — ABNORMAL LOW (ref 30.0–36.0)
MCV: 76.2 fL — ABNORMAL LOW (ref 80.0–100.0)
Monocytes Absolute: 0.4 10*3/uL (ref 0.1–1.0)
Monocytes Relative: 9 %
NRBC: 0 % (ref 0.0–0.2)
Neutro Abs: 2.6 10*3/uL (ref 1.7–7.7)
Neutrophils Relative %: 61 %
Platelets: 60 10*3/uL — ABNORMAL LOW (ref 150–400)
RBC: 4.5 MIL/uL (ref 4.22–5.81)
RDW: 24.3 % — ABNORMAL HIGH (ref 11.5–15.5)
WBC: 4.3 10*3/uL (ref 4.0–10.5)

## 2018-01-19 LAB — COMPREHENSIVE METABOLIC PANEL
ALBUMIN: 2.4 g/dL — AB (ref 3.5–5.0)
ALK PHOS: 98 U/L (ref 38–126)
ALT: 37 U/L (ref 0–44)
ANION GAP: 10 (ref 5–15)
AST: 65 U/L — ABNORMAL HIGH (ref 15–41)
BILIRUBIN TOTAL: 1.8 mg/dL — AB (ref 0.3–1.2)
BUN: 5 mg/dL — AB (ref 8–23)
CO2: 23 mmol/L (ref 22–32)
Calcium: 8.3 mg/dL — ABNORMAL LOW (ref 8.9–10.3)
Chloride: 104 mmol/L (ref 98–111)
Creatinine, Ser: 1.02 mg/dL (ref 0.61–1.24)
GFR calc Af Amer: >60 mL/min (ref >60)
GFR calc non Af Amer: >60 mL/min (ref >60)
GLUCOSE: 179 mg/dL — AB (ref 70–99)
Potassium: 3.5 mmol/L (ref 3.5–5.1)
SODIUM: 137 mmol/L (ref 135–145)
TOTAL PROTEIN: 7.3 g/dL (ref 6.5–8.1)

## 2018-01-19 LAB — LIPASE, BLOOD: Lipase: 86 U/L — ABNORMAL HIGH (ref 11–51)

## 2018-01-19 MED ORDER — OXYCODONE HCL 5 MG PO TABS
10.0000 mg | ORAL_TABLET | Freq: Once | ORAL | Status: AC
  Administered 2018-01-19: 10 mg via ORAL
  Filled 2018-01-19: qty 2

## 2018-01-19 NOTE — ED Notes (Signed)
WALKED PATIENT TO THE BATHROOM PATIENT DID WELL 

## 2018-01-19 NOTE — ED Triage Notes (Signed)
Pt complains of a hernia that has been popped out x 12 hours in his mid abd. Complains of pain to area. Denies n/v/d. Hernia has been present x 10 years. VSS
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
## 2018-01-19 NOTE — ED Notes (Signed)
Pt verbalized understanding of discharge paperwork and follow-up care.  °

## 2018-02-08 ENCOUNTER — Other Ambulatory Visit: Payer: Self-pay | Admitting: Gastroenterology

## 2018-03-06 ENCOUNTER — Ambulatory Visit: Payer: Medicare HMO | Admitting: Family Medicine

## 2018-04-01 ENCOUNTER — Other Ambulatory Visit: Payer: Self-pay

## 2018-04-01 ENCOUNTER — Encounter (HOSPITAL_COMMUNITY): Payer: Self-pay | Admitting: Emergency Medicine

## 2018-04-01 ENCOUNTER — Emergency Department (HOSPITAL_COMMUNITY)
Admission: EM | Admit: 2018-04-01 | Discharge: 2018-04-01 | Disposition: A | Discharge status: Home / Self Care | Payer: Medicare HMO | Attending: Emergency Medicine | Admitting: Emergency Medicine

## 2018-04-01 DIAGNOSIS — I10 Essential (primary) hypertension: Secondary | ICD-10-CM | POA: Diagnosis not present

## 2018-04-01 DIAGNOSIS — Z79899 Other long term (current) drug therapy: Secondary | ICD-10-CM | POA: Diagnosis not present

## 2018-04-01 DIAGNOSIS — L03115 Cellulitis of right lower limb: Secondary | ICD-10-CM | POA: Diagnosis present

## 2018-04-01 DIAGNOSIS — Z87891 Personal history of nicotine dependence: Secondary | ICD-10-CM | POA: Insufficient documentation

## 2018-04-01 DIAGNOSIS — L0291 Cutaneous abscess, unspecified: Secondary | ICD-10-CM

## 2018-04-01 DIAGNOSIS — E119 Type 2 diabetes mellitus without complications: Secondary | ICD-10-CM | POA: Diagnosis not present

## 2018-04-01 MED ORDER — HYDROMORPHONE HCL 1 MG/ML IJ SOLN
1.0000 mg | Freq: Once | INTRAMUSCULAR | Status: AC
  Administered 2018-04-01: 1 mg via INTRAMUSCULAR
  Filled 2018-04-01: qty 1

## 2018-04-01 MED ORDER — OXYCODONE-ACETAMINOPHEN 5-325 MG PO TABS: 2.0000 | ORAL_TABLET | Freq: Three times a day (TID) | ORAL | 0 refills | Status: DC | PRN

## 2018-04-01 MED ORDER — CEPHALEXIN 500 MG PO CAPS: 500.0000 mg | ORAL_CAPSULE | Freq: Four times a day (QID) | ORAL | 0 refills | Status: DC

## 2018-04-01 MED ORDER — LIDOCAINE-EPINEPHRINE (PF) 2 %-1:200000 IJ SOLN
20.0000 mL | Freq: Once | INTRAMUSCULAR | Status: AC
  Administered 2018-04-01: 20 mL via INTRADERMAL
  Filled 2018-04-01: qty 20

## 2018-04-01 MED ORDER — CEPHALEXIN 250 MG PO CAPS
500.0000 mg | ORAL_CAPSULE | Freq: Once | ORAL | Status: AC
  Administered 2018-04-01: 500 mg via ORAL
  Filled 2018-04-01: qty 2

## 2018-04-01 MED ORDER — OXYCODONE-ACETAMINOPHEN 5-325 MG PO TABS
1.0000 | ORAL_TABLET | Freq: Once | ORAL | Status: AC
  Administered 2018-04-01: 1 via ORAL
  Filled 2018-04-01: qty 1
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
## 2018-04-16 ENCOUNTER — Inpatient Hospital Stay: Payer: Medicare HMO | Admitting: Family Medicine

## 2018-04-17 ENCOUNTER — Encounter (HOSPITAL_COMMUNITY): Payer: Self-pay | Admitting: Emergency Medicine

## 2018-04-17 ENCOUNTER — Emergency Department (HOSPITAL_COMMUNITY)
Admission: EM | Admit: 2018-04-17 | Discharge: 2018-04-17 | Disposition: A | Discharge status: Home / Self Care | Payer: Medicare HMO | Attending: Emergency Medicine | Admitting: Emergency Medicine

## 2018-04-17 DIAGNOSIS — Z79899 Other long term (current) drug therapy: Secondary | ICD-10-CM | POA: Insufficient documentation

## 2018-04-17 DIAGNOSIS — Z5189 Encounter for other specified aftercare: Secondary | ICD-10-CM

## 2018-04-17 DIAGNOSIS — E119 Type 2 diabetes mellitus without complications: Secondary | ICD-10-CM | POA: Insufficient documentation

## 2018-04-17 DIAGNOSIS — Z48817 Encounter for surgical aftercare following surgery on the skin and subcutaneous tissue: Secondary | ICD-10-CM | POA: Diagnosis not present

## 2018-04-17 DIAGNOSIS — Z87891 Personal history of nicotine dependence: Secondary | ICD-10-CM | POA: Insufficient documentation

## 2018-04-17 DIAGNOSIS — I10 Essential (primary) hypertension: Secondary | ICD-10-CM | POA: Insufficient documentation

## 2018-04-17 DIAGNOSIS — D649 Anemia, unspecified: Secondary | ICD-10-CM

## 2018-04-17 LAB — COMPREHENSIVE METABOLIC PANEL
ALT: 26 U/L (ref 0–44)
AST: 56 U/L — AB (ref 15–41)
Albumin: 2.2 g/dL — ABNORMAL LOW (ref 3.5–5.0)
Alkaline Phosphatase: 88 U/L (ref 38–126)
Anion gap: 7 (ref 5–15)
BUN: <5 mg/dL — ABNORMAL LOW (ref 8–23)
CHLORIDE: 105 mmol/L (ref 98–111)
CO2: 24 mmol/L (ref 22–32)
Calcium: 8.2 mg/dL — ABNORMAL LOW (ref 8.9–10.3)
Creatinine, Ser: 0.88 mg/dL (ref 0.61–1.24)
Glucose, Bld: 101 mg/dL — ABNORMAL HIGH (ref 70–99)
POTASSIUM: 3.7 mmol/L (ref 3.5–5.1)
Sodium: 136 mmol/L (ref 135–145)
Total Bilirubin: 1.8 mg/dL — ABNORMAL HIGH (ref 0.3–1.2)
Total Protein: 6.4 g/dL — ABNORMAL LOW (ref 6.5–8.1)

## 2018-04-17 LAB — CBC WITH DIFFERENTIAL/PLATELET
ABS IMMATURE GRANULOCYTES: 0.01 10*3/uL (ref 0.00–0.07)
BASOS ABS: 0.1 10*3/uL (ref 0.0–0.1)
Basophils Relative: 2 %
Eosinophils Absolute: 0.3 10*3/uL (ref 0.0–0.5)
Eosinophils Relative: 8 %
HCT: 25 % — ABNORMAL LOW (ref 39.0–52.0)
Hemoglobin: 6.9 g/dL — CL (ref 13.0–17.0)
Immature Granulocytes: 0 %
LYMPHS PCT: 36 %
Lymphs Abs: 1.3 10*3/uL (ref 0.7–4.0)
MCH: 20.5 pg — ABNORMAL LOW (ref 26.0–34.0)
MCHC: 27.6 g/dL — ABNORMAL LOW (ref 30.0–36.0)
MCV: 74.4 fL — ABNORMAL LOW (ref 80.0–100.0)
Monocytes Absolute: 0.6 10*3/uL (ref 0.1–1.0)
Monocytes Relative: 18 %
NEUTROS ABS: 1.3 10*3/uL — AB (ref 1.7–7.7)
NEUTROS PCT: 36 %
NRBC: 0 % (ref 0.0–0.2)
PLATELETS: 44 10*3/uL — AB (ref 150–400)
RBC: 3.36 MIL/uL — AB (ref 4.22–5.81)
RDW: 23.5 % — AB (ref 11.5–15.5)
WBC: 3.6 10*3/uL — AB (ref 4.0–10.5)

## 2018-04-17 MED ORDER — GABAPENTIN 100 MG PO CAPS: 100.0000 mg | ORAL_CAPSULE | Freq: Three times a day (TID) | ORAL | 0 refills | Status: DC

## 2018-04-17 MED ORDER — OXYCODONE-ACETAMINOPHEN 5-325 MG PO TABS: 1.0000 | ORAL_TABLET | Freq: Three times a day (TID) | ORAL | 0 refills | Status: DC | PRN

## 2018-04-17 NOTE — ED Notes (Signed)
MD made aware pt requesting pain medication. [place] placed in room per request of MD.

## 2018-04-17 NOTE — ED Notes (Signed)
Patient verbalizes understanding of discharge instructions. Opportunity for questioning and answers were provided. Armband removed by staff, pt discharged from ER. Pt wheeled to lobby. Prescriptions, pharmacy and follow up care reviewed.

## 2018-04-17 NOTE — Discharge Instructions (Signed)
It appears as if the infection is getting better.  Watch for worsening redness or fevers.  Follow-up with your primary doctor.  Take the new medicine to help with the pain.  You also have an anemia.  You have elected to not have a further work-up of this.  Return for more bleeding or lightheadedness or dizziness.
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
## 2019-03-26 ENCOUNTER — Encounter (HOSPITAL_COMMUNITY): Payer: Self-pay

## 2019-03-26 ENCOUNTER — Emergency Department (HOSPITAL_COMMUNITY): Payer: Medicare Other

## 2019-03-26 ENCOUNTER — Other Ambulatory Visit: Payer: Self-pay

## 2019-03-26 ENCOUNTER — Emergency Department (HOSPITAL_COMMUNITY)
Admission: EM | Admit: 2019-03-26 | Discharge: 2019-03-26 | Disposition: A | Discharge status: Home / Self Care | Payer: Medicare Other | Attending: Emergency Medicine | Admitting: Emergency Medicine

## 2019-03-26 DIAGNOSIS — E119 Type 2 diabetes mellitus without complications: Secondary | ICD-10-CM | POA: Insufficient documentation

## 2019-03-26 DIAGNOSIS — I509 Heart failure, unspecified: Secondary | ICD-10-CM | POA: Insufficient documentation

## 2019-03-26 DIAGNOSIS — Z87891 Personal history of nicotine dependence: Secondary | ICD-10-CM | POA: Diagnosis not present

## 2019-03-26 DIAGNOSIS — M7989 Other specified soft tissue disorders: Secondary | ICD-10-CM | POA: Diagnosis present

## 2019-03-26 DIAGNOSIS — Z20822 Contact with and (suspected) exposure to covid-19: Secondary | ICD-10-CM | POA: Insufficient documentation

## 2019-03-26 DIAGNOSIS — I110 Hypertensive heart disease with heart failure: Secondary | ICD-10-CM | POA: Diagnosis not present

## 2019-03-26 DIAGNOSIS — Z79899 Other long term (current) drug therapy: Secondary | ICD-10-CM | POA: Insufficient documentation

## 2019-03-26 DIAGNOSIS — R600 Localized edema: Secondary | ICD-10-CM

## 2019-03-26 LAB — CBC
HCT: 30.6 % — ABNORMAL LOW (ref 39.0–52.0)
Hemoglobin: 9 g/dL — ABNORMAL LOW (ref 13.0–17.0)
MCH: 22.8 pg — ABNORMAL LOW (ref 26.0–34.0)
MCHC: 29.4 g/dL — ABNORMAL LOW (ref 30.0–36.0)
MCV: 77.7 fL — ABNORMAL LOW (ref 80.0–100.0)
Platelets: 41 10*3/uL — ABNORMAL LOW (ref 150–400)
RBC: 3.94 MIL/uL — ABNORMAL LOW (ref 4.22–5.81)
RDW: 25.9 % — ABNORMAL HIGH (ref 11.5–15.5)
WBC: 3.7 10*3/uL — ABNORMAL LOW (ref 4.0–10.5)
nRBC: 0 % (ref 0.0–0.2)

## 2019-03-26 LAB — TROPONIN I (HIGH SENSITIVITY): Troponin I (High Sensitivity): 17 ng/L (ref <18)

## 2019-03-26 LAB — POC SARS CORONAVIRUS 2 AG -  ED: SARS Coronavirus 2 Ag: NEGATIVE

## 2019-03-26 LAB — BRAIN NATRIURETIC PEPTIDE: B Natriuretic Peptide: 124.5 pg/mL — ABNORMAL HIGH (ref 0.0–100.0)

## 2019-03-26 MED ORDER — FUROSEMIDE 10 MG/ML IJ SOLN
40.0000 mg | Freq: Once | INTRAMUSCULAR | Status: AC
Start: 2019-03-26 — End: 2019-03-26
  Administered 2019-03-26: 40 mg via INTRAVENOUS
  Filled 2019-03-26: qty 4

## 2019-03-26 MED ORDER — MORPHINE SULFATE (PF) 4 MG/ML IV SOLN
4.0000 mg | Freq: Once | INTRAVENOUS | Status: AC
  Administered 2019-03-26: 22:00:00 4 mg via INTRAVENOUS
  Filled 2019-03-26: qty 1

## 2019-03-26 NOTE — ED Provider Notes (Signed)
[facility] EMERGENCY DEPARTMENT Provider Note   CSN: [id number] Arrival date & time: 03/26/19  2003     History Chief Complaint  Patient presents with  . Leg Swelling    [name] is a 69 y.o. male with history of type 2 diabetes, congestive heart failure, hypertension, presented to emergency department leg swelling shortness of breath.  The patient reports his legs been gradually swelling for many days.  He says he tried compression stockings but they tend to hurt his legs, and is complaining about pain in his legs, so does not wear them consistently.  He also reports feeling progressively short of breath little by little every day.  He has orthopnea.  He says he was told to congestive heart failure in the past but has not seen a cardiologist in some time.  He was prescribed Lasix 40 mg daily, initially telling me that he has been compliant with this medicine, later clarifying that he has really only taking this for the past few days, and has not normally taken this medication daily.  No CP.  No hx of MI.  HPI     Past Medical History:  Diagnosis Date  . Abdominal hernia   . Arthritis    "hands" (02/16/2016)  . Bright red rectal bleeding    "@ least 1-2 times/month; sometimes more" (02/16/2016)  . Cirrhosis ([place])   . Hypertension   . Type II diabetes mellitus [facility])     Patient Active Problem List   Diagnosis Date Noted  . SIRS (systemic inflammatory response syndrome) ([place]) 08/14/2016  . Prolonged QT interval 08/14/2016  . Anxiety 03/06/2016  . Insomnia 03/06/2016  . Hypertension 03/06/2016  . Hepatitis C antibody test positive 02/18/2016  . Thrombocytopenia ([place]) 02/16/2016  . Normochromic normocytic anemia 02/16/2016  . Alcohol abuse 02/16/2016  . Cirrhosis of liver ([place]) 02/16/2016    Past Surgical History:  Procedure Laterality Date  . COLONOSCOPY N/A 02/17/2016   Procedure: COLONOSCOPY;  Surgeon: [name], MD;  Location[facility] ENDOSCOPY;   Service: Endoscopy;  Laterality: N/A;  . ESOPHAGOGASTRODUODENOSCOPY N/A 02/17/2016   Procedure: ESOPHAGOGASTRODUODENOSCOPY (EGD);  Surgeon: [name], MD;  Location: [facility] ENDOSCOPY;  Service: Endoscopy;  Laterality: N/A;  . FRACTURE SURGERY    . IR PARACENTESIS  08/14/2016  . [place]   "has steel rods in it"       Family History  Problem Relation Age of Onset  . Hypertension Other     Social History   Tobacco Use  . Smoking status: Former Smoker    Packs/day: 0.50    Years: 38.00    Pack years: 19.00    Types: Cigarettes    Quit date: 2009    Years since quitting: 12.1  . Smokeless tobacco: Never Used  Substance Use Topics  . Alcohol use: No    Comment: Quit in 4/18  . Drug use: No    Home Medications Prior to Admission medications   Medication Sig Start Date End Date Taking? Authorizing Provider  furosemide (LASIX) 40 MG tablet TAKE 1 TABLET BY MOUTH EVERY DAY Patient taking differently: Take 40 mg by mouth at bedtime.  02/08/18  Yes Milus Banister, MD  cephALEXin (KEFLEX) 500 MG capsule Take 1 capsule (500 mg total) by mouth 4 (four) times daily. Patient not taking: Reported on 03/26/2019 04/01/18   Mesner, Corene Cornea, MD  gabapentin (NEURONTIN) 100 MG capsule Take 1 capsule (100 mg total) by mouth 3 (three) times daily.  Patient not taking: Reported on 03/26/2019 04/17/18   Davonna Belling, MD  oxyCODONE-acetaminophen (PERCOCET/ROXICET) 5-325 MG tablet Take 1 tablet by mouth every 8 (eight) hours as needed for severe pain. Patient not taking: Reported on 03/26/2019 04/17/18   Davonna Belling, MD  spironolactone (ALDACTONE) 100 MG tablet TAKE 1 TABLET BY MOUTH EVERY DAY Patient not taking: K+ sparing diuretic: Hyperkalemia may occur with decreased renal function 01/04/18   Milus Banister, MD    Allergies    Patient has no known allergies.  Review of Systems   Review of Systems  Constitutional: Negative for chills and fever.  Respiratory: Positive  for shortness of breath. Negative for cough.   Cardiovascular: Positive for leg swelling. Negative for chest pain and palpitations.  Gastrointestinal: Negative for nausea and vomiting.  Skin: Negative for pallor and rash.  Neurological: Negative for syncope and light-headedness.  All other systems reviewed and are negative.   Physical Exam Updated Vital Signs BP 130/83   Pulse 82   Temp 97.9 F (36.6 C) (Oral)   Resp 18   Ht 5\' 11"  (1.803 m)   Wt 90.7 kg   SpO2 99%   BMI 27.89 kg/m   Physical Exam Vitals and nursing note reviewed.  Constitutional:      Appearance: He is well-developed.  HENT:     Head: Normocephalic and atraumatic.  Eyes:     Conjunctiva/sclera: Conjunctivae normal.  Cardiovascular:     Rate and Rhythm: Normal rate and regular rhythm.     Pulses: Normal pulses.  Pulmonary:     Effort: Pulmonary effort is normal. No respiratory distress.     Breath sounds: Normal breath sounds.  Abdominal:     Palpations: Abdomen is soft.     Tenderness: There is no abdominal tenderness.  Musculoskeletal:     Cervical back: Neck supple.     Right lower leg: Edema present.     Left lower leg: Edema present.  Skin:    General: Skin is warm and dry.  Neurological:     General: No focal deficit present.     Mental Status: He is alert and oriented to person, place, and time.     ED Results / Procedures / Treatments   Labs (all labs ordered are listed, but only abnormal results are displayed) Labs Reviewed  CBC - Abnormal; Notable for the following components:      Result Value   WBC 3.7 (*)    RBC 3.94 (*)    Hemoglobin 9.0 (*)    HCT 30.6 (*)    MCV 77.7 (*)    MCH 22.8 (*)    MCHC 29.4 (*)    RDW 25.9 (*)    Platelets 41 (*)    All other components within normal limits  BRAIN NATRIURETIC PEPTIDE - Abnormal; Notable for the following components:   B Natriuretic Peptide 124.5 (*)    All other components within normal limits  BASIC METABOLIC PANEL  POC  SARS CORONAVIRUS 2 AG -  ED  TROPONIN I (HIGH SENSITIVITY)    EKG EKG Interpretation  Date/Time:  Wednesday March 26 2019 20:13:26 EST Ventricular Rate:  71 PR Interval:    QRS Duration: 111 QT Interval:  471 QTC Calculation: 512 R Axis:   20 Text Interpretation: Sinus rhythm Prolonged QT interval No STEMI Confirmed by Octaviano Glow [phone]) on 03/26/2019 10:41:42 PM   Radiology DG Chest Portable 1 View  Result Date: 03/26/2019 CLINICAL DATA:  Shortness of breath EXAM: PORTABLE  CHEST 1 VIEW COMPARISON:  08/13/2016 FINDINGS: Cardiac shadow is within normal limits. Tortuous thoracic aorta is seen. The overall inspiratory effort is poor without focal infiltrate or sizable effusion. No bony abnormality is noted. IMPRESSION: No active disease. Electronically Signed   By: Inez Catalina M.D.   On: 03/26/2019 21:11    Procedures Procedures (including critical care time)  Medications Ordered in ED Medications  furosemide (LASIX) injection 40 mg (40 mg Intravenous Given 03/26/19 2149)  morphine 4 MG/ML injection 4 mg (4 mg Intravenous Given 03/26/19 2149)    ED Course  I have reviewed the triage vital signs and the nursing notes.  Pertinent labs & imaging results that were available during my care of the patient were reviewed by me and considered in my medical decision making (see chart for details).   69 year old male with a history of congestive heart failure presenting to the emergency department with gradually progressive shortness of breath and bilateral lower extremity leg edema.  History is consistent with the CHF exacerbation.  He does have orthopnea.  He does have pitting edema up to the mid thighs.  Reports that he has not been compliant with his Lasix.  I suspect this is likely underlying cause.  His EKG does not show signs of acute ischemia.  I doubt this is a PE or ACS.  He has no signs or symptoms of acute infection.  BNP mildly elevated.  Don't think he needs urgent  hospitalization. No oxygen requirement.  Plan to give him some IV Lasix diuresis here.  I discussed wearing compression stockings at home.  He will need to follow-up with his cardiologist if his work-up is benign.  Clinical Course as of Mar 27 15  Wed Mar 26, 2019  2333 Filled two urinals with urine.  Feeling better.  Discussed f/u with cardiologist, and ADHERENCE to his lasix regimen (he reports now he hasn't been taking it "like I'm supposed to," and certainly not daily).   [MT]    Clinical Course User Index [MT] Reda Citron, Carola Rhine, MD    Final Clinical Impression(s) / ED Diagnoses Final diagnoses:  Acute on chronic congestive heart failure, unspecified heart failure type [facility])  Leg edema    Rx / DC Orders ED Discharge Orders    None       Langston Masker Carola Rhine, MD 03/27/19 [phone]

## 2019-03-26 NOTE — ED Triage Notes (Signed)
Pt arrived via [facility] from home c/o of increased BLE edema. Pt states he also got short of breath this morning just sitting in the bed. Pt has a hx of CHF and takes 40 mg lasix. Pt c/o of 9/10 leg pain.

## 2019-03-26 NOTE — Discharge Instructions (Addendum)
You were diagnosed with a flareup of your heart failure today.  This is likely due to the fact that you have not been taking your Lasix regularly.  This is a diuretic that helps you pee out the extra fluid in your body.  It is important you take this medication as prescribed.  You told me already had Lasix at home and did not need a new prescription.  You can also buy compression stockings for your lower legs, keep your legs elevated on the couch and you are at home.  This will help the swelling in your legs.  Try to avoid really salty food and fried food.  This will make your body retain lots of water.  Finally, please follow up with your cardiologist's office TOMORROW.  You should call to see if they can schedule a rapid follow up appointment.  If you do not have a cardiologist anymore, call the [place] group number tomorrow.  Heart failure can be a progressive and dangerous disease, leading to complete heart failure, respiratory distress, and even death, if left untreated.

## 2019-03-27 LAB — BASIC METABOLIC PANEL WITH GFR
Anion gap: 9 (ref 5–15)
BUN: <5 mg/dL — ABNORMAL LOW (ref 8–23)
CO2: 26 mmol/L (ref 22–32)
Calcium: 7.7 mg/dL — ABNORMAL LOW (ref 8.9–10.3)
Chloride: 105 mmol/L (ref 98–111)
Creatinine, Ser: 0.68 mg/dL (ref 0.61–1.24)
GFR calc Af Amer: >60 mL/min
GFR calc non Af Amer: >60 mL/min
Glucose, Bld: 100 mg/dL — ABNORMAL HIGH (ref 70–99)
Potassium: 3.4 mmol/L — ABNORMAL LOW (ref 3.5–5.1)
Sodium: 140 mmol/L (ref 135–145)

## 2019-03-30 NOTE — Progress Notes (Deleted)
Cardiology Office Note   Date:  03/30/2019   ID:  [name], DOB June 23, 1950, MRN [id number]  PCP:  Charlott Rakes, MD  Cardiologist:   No primary care provider on file. Referring:  ***  No chief complaint on file.     History of Present Illness: [name] is a 69 y.o. male who presents for ***    He presented earlier this month to the ED with dyspnea and leg swelling. I reviewed these records for this visit.   EKG demonstrated no acute abnormalities.  The QTc was markedly prolonged.  ***   He did have an echo in 2018 which demonstrated a well preserved EF.     Past Medical History:  Diagnosis Date  . Abdominal hernia   . Arthritis    "hands" (02/16/2016)  . Bright red rectal bleeding    "@ least 1-2 times/month; sometimes more" (02/16/2016)  . Cirrhosis ([place])   . Hypertension   . Type II diabetes mellitus ([place])     Past Surgical History:  Procedure Laterality Date  . COLONOSCOPY N/A 02/17/2016   Procedure: COLONOSCOPY;  Surgeon: [name], MD;  Location: [facility] ENDOSCOPY;  Service: Endoscopy;  Laterality: N/A;  . ESOPHAGOGASTRODUODENOSCOPY N/A 02/17/2016   Procedure: ESOPHAGOGASTRODUODENOSCOPY (EGD);  Surgeon: [name], MD;  [facility] ENDOSCOPY;  Service: Endoscopy;  Laterality: N/A;  . FRACTURE SURGERY    . IR PARACENTESIS  08/14/2016  . [place]   "has steel rods in it"     Current Outpatient Medications  Medication Sig Dispense Refill  . cephALEXin (KEFLEX) 500 MG capsule Take 1 capsule (500 mg total) by mouth 4 (four) times daily. (Patient not taking: Reported on 03/26/2019) 28 capsule 0  . furosemide (LASIX) 40 MG tablet TAKE 1 TABLET BY MOUTH EVERY DAY (Patient taking differently: Take 40 mg by mouth at bedtime. ) 90 tablet 2  . gabapentin (NEURONTIN) 100 MG capsule Take 1 capsule (100 mg total) by mouth 3 (three) times daily. (Patient not taking: Reported on 03/26/2019) 30 capsule 0  . oxyCODONE-acetaminophen (PERCOCET/ROXICET)  5-325 MG tablet Take 1 tablet by mouth every 8 (eight) hours as needed for severe pain. (Patient not taking: Reported on 03/26/2019) 4 tablet 0  . spironolactone (ALDACTONE) 100 MG tablet TAKE 1 TABLET BY MOUTH EVERY DAY (Patient not taking: K+ sparing diuretic: Hyperkalemia may occur with decreased renal function) 90 tablet 2   No current facility-administered medications for this visit.    Allergies:   Patient has no known allergies.    Social History:  The patient  reports that he quit smoking about 12 years ago. His smoking use included cigarettes. He has a 19.00 pack-year smoking history. He has never used smokeless tobacco. He reports that he does not drink alcohol or use drugs.   Family History:  The patient's ***family history includes Hypertension in an other family member.    ROS:  Please see the history of present illness.   Otherwise, review of systems are positive for {NONE DEFAULTED:18576::"none"}.   All other systems are reviewed and negative.    PHYSICAL EXAM: VS:  There were no vitals taken for this visit. , BMI There is no height or weight on file to calculate BMI. GENERAL:  Well appearing HEENT:  Pupils equal round and reactive, fundi not visualized, oral mucosa unremarkable NECK:  No jugular venous distention, waveform within normal limits, carotid upstroke brisk and symmetric, no bruits, no thyromegaly LYMPHATICS:  No cervical, inguinal adenopathy  LUNGS:  Clear to auscultation bilaterally BACK:  No CVA tenderness CHEST:  Unremarkable HEART:  PMI not displaced or sustained,S1 and S2 within normal limits, no S3, no S4, no clicks, no rubs, *** murmurs ABD:  Flat, positive bowel sounds normal in frequency in pitch, no bruits, no rebound, no guarding, no midline pulsatile mass, no hepatomegaly, no splenomegaly EXT:  2 plus pulses throughout, no edema, no cyanosis no clubbing SKIN:  No rashes no nodules NEURO:  Cranial nerves II through XII grossly intact, motor grossly  intact throughout PSYCH:  Cognitively intact, oriented to person place and time    EKG:  EKG {ACTION; IS/IS GI:[id number] ordered today. The ekg ordered today demonstrates ***   Recent Labs: 04/17/2018: ALT 26 03/26/2019: B Natriuretic Peptide 124.5; BUN <5; Creatinine, Ser 0.68; Hemoglobin 9.0; Platelets 41; Potassium 3.4; Sodium 140    Lipid Panel    Component Value Date/Time   CHOL 88 (L) 09/07/2016 0939   TRIG 56 09/07/2016 0939   HDL 35 (L) 09/07/2016 0939   CHOLHDL 2.5 09/07/2016 0939   LDLCALC 42 09/07/2016 0939      Wt Readings from Last 3 Encounters:  03/26/19 200 lb (90.7 kg)  09/27/17 195 lb (88.5 kg)  08/05/17 190 lb (86.2 kg)      Other studies Reviewed: Additional studies/ records that were reviewed today include: ***. Review of the above records demonstrates:  Please see elsewhere in the note.  ***   ASSESSMENT AND PLAN:  DYSPNEA:  ***  PROLONGED QT:  ***    Current medicines are reviewed at length with the patient today.  The patient {ACTIONS; HAS/DOES NOT HAVE:19233} concerns regarding medicines.  The following changes have been made:  {PLAN; NO CHANGE:13088:s}  Labs/ tests ordered today include: *** No orders of the defined types were placed in this encounter.    Disposition:   FU with ***    Signed, Minus Breeding, MD  03/30/2019 3:06 PM    [facility]

## 2019-03-31 ENCOUNTER — Ambulatory Visit: Payer: Medicare Other | Admitting: Cardiology

## 2019-04-03 ENCOUNTER — Ambulatory Visit: Payer: Medicare Other | Admitting: Cardiology

## 2019-04-03 NOTE — Progress Notes (Incomplete)
?  Cardiology Office Note:   ? ?Date:  04/03/2019  ? ?ID:  [name], DOB Oct 17, 1950, MRN [id number] ? ?PCP:  Charlott Rakes, MD  ?Cardiologist:  No primary care provider on file. ? ?Referring MD: Charlott Rakes, MD  ? ?No chief complaint on file. ? ? ?History of Present Illness:   ? ?[name] is a 69 y.o. male with a hx of type II diabetes, hypertension, chronic diastolic heart failure who is seen as a new consult at the request of Charlott Rakes, MD for the evaluation and management of heart failure. ? ?Most recent note from 03/26/19 ER visit with Dr. Langston Masker reviewed. Presented with bilateral LE edema, shortness of breath, and orthopnea. Has not been taking lasix daily per notes. Has not seen cardiology in many years. ? ?Past Medical History:  ?Diagnosis Date  ?? Abdominal hernia   ?? Arthritis   ? "hands" (02/16/2016)  ?? Bright red rectal bleeding   ? "@ least 1-2 times/month; sometimes more" (02/16/2016)  ?? Cirrhosis ([place])   ?? Hypertension   ?? Type II diabetes mellitus ([place])   ? ? ?Past Surgical History:  ?Procedure Laterality Date  ?? COLONOSCOPY N/A 02/17/2016  ? Procedure: COLONOSCOPY;  Surgeon: [name], MD;  Location: [facility] ENDOSCOPY;  Service: Endoscopy;  Laterality: N/A;  ?? ESOPHAGOGASTRODUODENOSCOPY N/A 02/17/2016  ? Procedure: ESOPHAGOGASTRODUODENOSCOPY (EGD);  Surgeon: [name], MD;  Location: [facility] ENDOSCOPY;  Service: Endoscopy;  Laterality: N/A;  ?? FRACTURE SURGERY    ?? IR PARACENTESIS  08/14/2016  ?? PELVIC FRACTURE SURGERY  1998  ? "has steel rods in it"  ? ? ?Current Medications: ?Current Outpatient Medications on File Prior to Visit  ?Medication Sig  ?? cephALEXin (KEFLEX) 500 MG capsule Take 1 capsule (500 mg total) by mouth 4 (four) times daily. (Patient not taking: Reported on 03/26/2019)  ?? furosemide (LASIX) 40 MG tablet TAKE 1 TABLET BY MOUTH EVERY DAY (Patient taking differently: Take 40 mg by mouth at bedtime. )  ? ? gabapentin (NEURONTIN) 100 MG capsule Take 1 capsule (100 mg total) by mouth 3 (three) times daily. (Patient not tak

## 2019-04-09 ENCOUNTER — Encounter: Payer: Self-pay | Admitting: Emergency Medicine

## 2019-05-06 ENCOUNTER — Emergency Department (HOSPITAL_COMMUNITY)
Admission: EM | Admit: 2019-05-06 | Discharge: 2019-05-06 | Disposition: A | Discharge status: Home / Self Care | Payer: Medicare Other | Attending: Emergency Medicine | Admitting: Emergency Medicine

## 2019-05-06 ENCOUNTER — Emergency Department (HOSPITAL_COMMUNITY): Payer: Medicare Other

## 2019-05-06 ENCOUNTER — Encounter (HOSPITAL_COMMUNITY): Payer: Self-pay | Admitting: Emergency Medicine

## 2019-05-06 ENCOUNTER — Emergency Department (HOSPITAL_BASED_OUTPATIENT_CLINIC_OR_DEPARTMENT_OTHER): Payer: Medicare Other

## 2019-05-06 DIAGNOSIS — Z79899 Other long term (current) drug therapy: Secondary | ICD-10-CM | POA: Diagnosis not present

## 2019-05-06 DIAGNOSIS — R609 Edema, unspecified: Secondary | ICD-10-CM | POA: Diagnosis not present

## 2019-05-06 DIAGNOSIS — K7460 Unspecified cirrhosis of liver: Secondary | ICD-10-CM | POA: Diagnosis not present

## 2019-05-06 DIAGNOSIS — R0602 Shortness of breath: Secondary | ICD-10-CM | POA: Diagnosis not present

## 2019-05-06 DIAGNOSIS — E119 Type 2 diabetes mellitus without complications: Secondary | ICD-10-CM | POA: Diagnosis not present

## 2019-05-06 DIAGNOSIS — R2243 Localized swelling, mass and lump, lower limb, bilateral: Secondary | ICD-10-CM | POA: Insufficient documentation

## 2019-05-06 DIAGNOSIS — M79605 Pain in left leg: Secondary | ICD-10-CM | POA: Diagnosis not present

## 2019-05-06 DIAGNOSIS — Z7984 Long term (current) use of oral hypoglycemic drugs: Secondary | ICD-10-CM | POA: Insufficient documentation

## 2019-05-06 DIAGNOSIS — I10 Essential (primary) hypertension: Secondary | ICD-10-CM | POA: Diagnosis not present

## 2019-05-06 DIAGNOSIS — M79604 Pain in right leg: Secondary | ICD-10-CM | POA: Diagnosis not present

## 2019-05-06 LAB — CBC WITH DIFFERENTIAL/PLATELET
Abs Immature Granulocytes: 0.07 10*3/uL (ref 0.00–0.07)
Basophils Absolute: 0 10*3/uL (ref 0.0–0.1)
Basophils Relative: 0 %
Eosinophils Absolute: 0 10*3/uL (ref 0.0–0.5)
Eosinophils Relative: 0 %
HCT: 39 % (ref 39.0–52.0)
Hemoglobin: 11.5 g/dL — ABNORMAL LOW (ref 13.0–17.0)
Immature Granulocytes: 1 %
Lymphocytes Relative: 7 %
Lymphs Abs: 0.5 10*3/uL — ABNORMAL LOW (ref 0.7–4.0)
MCH: 25.2 pg — ABNORMAL LOW (ref 26.0–34.0)
MCHC: 29.5 g/dL — ABNORMAL LOW (ref 30.0–36.0)
MCV: 85.3 fL (ref 80.0–100.0)
Monocytes Absolute: 0.9 10*3/uL (ref 0.1–1.0)
Monocytes Relative: 11 %
Neutro Abs: 6.2 10*3/uL (ref 1.7–7.7)
Neutrophils Relative %: 81 %
Platelets: UNDETERMINED 10*3/uL (ref 150–400)
RBC: 4.57 MIL/uL (ref 4.22–5.81)
RDW: 28.8 % — ABNORMAL HIGH (ref 11.5–15.5)
WBC: 7.7 10*3/uL (ref 4.0–10.5)
nRBC: 0 % (ref 0.0–0.2)

## 2019-05-06 LAB — COMPREHENSIVE METABOLIC PANEL
ALT: 42 U/L (ref 0–44)
AST: 61 U/L — ABNORMAL HIGH (ref 15–41)
Albumin: 1.8 g/dL — ABNORMAL LOW (ref 3.5–5.0)
Alkaline Phosphatase: 89 U/L (ref 38–126)
Anion gap: 11 (ref 5–15)
BUN: 14 mg/dL (ref 8–23)
CO2: 20 mmol/L — ABNORMAL LOW (ref 22–32)
Calcium: 8.3 mg/dL — ABNORMAL LOW (ref 8.9–10.3)
Chloride: 102 mmol/L (ref 98–111)
Creatinine, Ser: 0.92 mg/dL (ref 0.61–1.24)
GFR calc Af Amer: >60 mL/min (ref >60)
GFR calc non Af Amer: >60 mL/min (ref >60)
Glucose, Bld: 107 mg/dL — ABNORMAL HIGH (ref 70–99)
Potassium: 3.7 mmol/L (ref 3.5–5.1)
Sodium: 133 mmol/L — ABNORMAL LOW (ref 135–145)
Total Bilirubin: 6 mg/dL — ABNORMAL HIGH (ref 0.3–1.2)
Total Protein: 7.6 g/dL (ref 6.5–8.1)

## 2019-05-06 LAB — BRAIN NATRIURETIC PEPTIDE: B Natriuretic Peptide: 273.5 pg/mL — ABNORMAL HIGH (ref 0.0–100.0)

## 2019-05-06 LAB — MAGNESIUM: Magnesium: 2 mg/dL (ref 1.7–2.4)

## 2019-05-06 MED ORDER — FUROSEMIDE 10 MG/ML IJ SOLN
40.0000 mg | Freq: Once | INTRAMUSCULAR | Status: AC
  Administered 2019-05-06: 40 mg via INTRAVENOUS
  Filled 2019-05-06: qty 4

## 2019-05-06 MED ORDER — OXYCODONE HCL 5 MG PO TABS: 5.0000 mg | ORAL_TABLET | Freq: Once | ORAL | Status: DC

## 2019-05-06 MED ORDER — HYDROCODONE-ACETAMINOPHEN 5-325 MG PO TABS
1.0000 | ORAL_TABLET | Freq: Once | ORAL | Status: AC
  Administered 2019-05-06: 1 via ORAL
  Filled 2019-05-06: qty 1

## 2019-05-06 NOTE — ED Notes (Signed)
Pt very concerned RE: pain meds- states he is out of his meds and cannot get anymore for 2 days- is on chronic pain meds.

## 2019-05-06 NOTE — ED Provider Notes (Signed)
Physical Exam  BP (!) 157/107 (BP Location: Right Arm)   Pulse 83   Temp (!) 97.4 F (36.3 C) (Oral)   Resp 17   SpO2 98%   Physical Exam Vitals and nursing note reviewed.  Constitutional:      General: He is not in acute distress.    Appearance: He is well-developed. He is not diaphoretic.  HENT:     Head: Normocephalic and atraumatic.  Eyes:     General: No scleral icterus.    Conjunctiva/sclera: Conjunctivae normal.  Pulmonary:     Effort: Pulmonary effort is normal. No respiratory distress.  Musculoskeletal:     Cervical back: Normal range of motion.  Skin:    Findings: No rash.  Neurological:     Mental Status: He is alert.     ED Course/Procedures   Clinical Course as of May 06 1851  Tue May 06, 2019  1723 Total Bilirubin(!): 6.0 [HK]  1723 B Natriuretic Peptide(!): 273.5 [HK]    Clinical Course User Index [HK] Delia Heady, PA-C    Procedures  MDM   Care of patient assumed from PA Caccavale at 3:30 PM.  Agree with history, physical exam and plan.  See their note for further details.  Briefly, 69 y.o. male with PMH/PSH as below who presents with a chief complaint of leg pain.  Reports persistent bilateral leg pain worse on the right side.  States he has been taking his Lasix.  He has edema bilateral lower extremities noted on exam.  Normal pulses noted.  Ultrasounds done today bilaterally without signs of DVT.  Chest x-ray without any acute findings.  Work-up significant for elevated T bili of 6, normal LFTs.  Clinical concern for CHF.  Past Medical History:  Diagnosis Date  . Abdominal hernia   . Arthritis    "hands" (02/16/2016)  . Bright red rectal bleeding    "@ least 1-2 times/month; sometimes more" (02/16/2016)  . Cirrhosis ([place])   . Hypertension   . Type II diabetes mellitus ([place])    Past Surgical History:  Procedure Laterality Date  . COLONOSCOPY N/A 02/17/2016   Procedure: COLONOSCOPY;  Surgeon: [name], MD;  Location: [facility] ENDOSCOPY;   Service: Endoscopy;  Laterality: N/A;  . ESOPHAGOGASTRODUODENOSCOPY N/A 02/17/2016   Procedure: ESOPHAGOGASTRODUODENOSCOPY (EGD);  Surgeon: [name], MD;  Location: [facility] ENDOSCOPY;  Service: Endoscopy;  Laterality: N/A;  . FRACTURE SURGERY    . IR PARACENTESIS  08/14/2016  . [place]   "has steel rods in it"      Current Plan: Obtain BNP, right upper quadrant ultrasound to evaluate for cause of elevation in bilirubin.  Patient given IV Lasix here as well as pain control.  If work-up is reassuring, will be discharged home with follow-up and increase in Lasix.   MDM/ED Course: BNP elevated to 200s.  Slightly higher than priors.  Right upper quadrant ultrasound without any acute findings, does show liver mass, recommend nonemergent MRI.  Patient informed of these results.  Pain is controlled here.  I believe his main concern today is getting his pain medications prescribed as he ran out of his oxycodone 10 mg and is not due for a repeat prescription for the next 3 days.  Educated patient that we are unable to refill chronic pain medications and that it is best for him to follow-up with the provider that is prescribing these.  The meantime we will have him continue Lasix and follow up with PCP.  Consults: None   Significant labs/images: VAS [place] LOWER EXTREMITY VENOUS (DVT) (ONLY [facility] & [facility] 7a-7p)  Result Date: 05/06/2019  Lower Venous DVTStudy Indications: Edema.  Comparison Study: no prior Performing Technologist: Abram Sander RVS  Examination Guidelines: A complete evaluation includes B-mode imaging, spectral Doppler, color Doppler, and power Doppler as needed of all accessible portions of each vessel. Bilateral testing is considered an integral part of a complete examination. Limited examinations for reoccurring indications may be performed as noted. The reflux portion of the exam is performed with the patient in reverse Trendelenburg.   +---------+---------------+---------+-----------+----------+--------------+ RIGHT    CompressibilityPhasicitySpontaneityPropertiesThrombus Aging +---------+---------------+---------+-----------+----------+--------------+ CFV      Full           Yes      Yes                                 +---------+---------------+---------+-----------+----------+--------------+ SFJ      Full                                                        +---------+---------------+---------+-----------+----------+--------------+ FV Prox  Full                                                        +---------+---------------+---------+-----------+----------+--------------+ FV Mid   Full                                                        +---------+---------------+---------+-----------+----------+--------------+ FV DistalFull                                                        +---------+---------------+---------+-----------+----------+--------------+ PFV      Full                                                        +---------+---------------+---------+-----------+----------+--------------+ POP      Full           Yes      Yes                                 +---------+---------------+---------+-----------+----------+--------------+ PTV      Full                                                        +---------+---------------+---------+-----------+----------+--------------+ PERO  Not visualized +---------+---------------+---------+-----------+----------+--------------+   +---------+---------------+---------+-----------+----------+--------------+ LEFT     CompressibilityPhasicitySpontaneityPropertiesThrombus Aging +---------+---------------+---------+-----------+----------+--------------+ CFV      Full           Yes      Yes                                  +---------+---------------+---------+-----------+----------+--------------+ SFJ      Full                                                        +---------+---------------+---------+-----------+----------+--------------+ FV Prox  Full                                                        +---------+---------------+---------+-----------+----------+--------------+ FV Mid   Full                                                        +---------+---------------+---------+-----------+----------+--------------+ FV DistalFull                                                        +---------+---------------+---------+-----------+----------+--------------+ PFV      Full                                                        +---------+---------------+---------+-----------+----------+--------------+ POP      Full           Yes      Yes                                 +---------+---------------+---------+-----------+----------+--------------+ PTV      Full                                                        +---------+---------------+---------+-----------+----------+--------------+ PERO                                                  Not visualized +---------+---------------+---------+-----------+----------+--------------+     Summary: BILATERAL: - No evidence of deep vein thrombosis seen in the lower extremities, bilaterally.   *See table(s) above for measurements and observations. Electronically signed by Deitra Mayo MD on 05/06/2019 at 3:15:22 PM.    Final    US Abdomen Limited RUQ  Result Date: 05/06/2019 CLINICAL DATA:  Elevated bilirubin EXAM: ULTRASOUND ABDOMEN LIMITED RIGHT UPPER QUADRANT COMPARISON:  05/03/2017 FINDINGS: Gallbladder: No definitive cholelithiasis is noted. Gallbladder wall is thickened likely in part due to decompression as well as mild ascites. Common bile duct: Not well visualized Liver: Nodularity and heterogeneity in the liver  is noted consistent with underlying cirrhosis. There is a 3.8 cm left lobe mass lesion identified which is relatively isoechoic with the adjacent liver. This has not been visualized on prior exams. Nonemergent MRI is recommended for further evaluation. Portal vein is patent on color Doppler imaging with normal direction of blood flow towards the liver. Other: None. IMPRESSION: Changes consistent with cirrhosis with ascites. No definitive biliary ductal dilatation is seen. 3.8 cm relatively isoechoic mass within the left lobe of the liver. Nonemergent MRI is recommended for further workup to allow for optimum imaging. Electronically Signed   By: Inez Catalina M.D.   On: 05/06/2019 17:53   Labs Reviewed  CBC WITH DIFFERENTIAL/PLATELET - Abnormal; Notable for the following components:      Result Value   Hemoglobin 11.5 (*)    MCH 25.2 (*)    MCHC 29.5 (*)    RDW 28.8 (*)    Lymphs Abs 0.5 (*)    All other components within normal limits  COMPREHENSIVE METABOLIC PANEL - Abnormal; Notable for the following components:   Sodium 133 (*)    CO2 20 (*)    Glucose, Bld 107 (*)    Calcium 8.3 (*)    Albumin 1.8 (*)    AST 61 (*)    Total Bilirubin 6.0 (*)    All other components within normal limits  BRAIN NATRIURETIC PEPTIDE - Abnormal; Notable for the following components:   B Natriuretic Peptide 273.5 (*)    All other components within normal limits  MAGNESIUM    Patient is hemodynamically stable, in NAD, and able to ambulate in the ED. Evaluation does not show pathology that would require ongoing emergent intervention or inpatient treatment. I have personally reviewed and interpreted all lab work and imaging at today's ED visit. I explained the diagnosis to the patient. Pain has been managed and has no complaints prior to discharge. Patient is comfortable with above plan and is stable for discharge at this time. All questions were answered prior to disposition. Strict return precautions for  returning to the ED were discussed. Encouraged follow up with PCP.   An After Visit Summary was printed and given to the patient.   Portions of this note were generated with [employer]. Dictation errors may occur despite best attempts at proofreading.           Delia Heady, PA-C 05/06/19 1853    [name], MD 05/12/19 [phone]

## 2019-05-06 NOTE — ED Provider Notes (Addendum)
[facility] EMERGENCY DEPARTMENT Provider Note   CSN: [id number] Arrival date & time: 05/06/19  1209     History Chief Complaint  Patient presents with  . Leg Pain    [name] is a 69 y.o. male presented for evaluation of leg pain.  Patient states he has had leg pain for several months, however pain is worsening over the past several days.  He states pain is present bilaterally, but worse on the right side.  He does not know if he is having any swelling.  He denies trauma or injury.  He has not taken anything for his symptoms.  He states he is taking his medication as prescribed, including his Lasix.  He last saw his doctor last month.  He denies numbness or tingling.  He denies back pain.  He reports mild shortness of breath, which is baseline.  No chest pain.  No significant cough.  No fevers, chills, nausea, vomiting, abdominal pain.  HPI     Past Medical History:  Diagnosis Date  . Abdominal hernia   . Arthritis    "hands" (02/16/2016)  . Bright red rectal bleeding    "@ least 1-2 times/month; sometimes more" (02/16/2016)  . Cirrhosis ([place])   . Hypertension   . Type II diabetes mellitus [facility])     Patient Active Problem List   Diagnosis Date Noted  . SIRS (systemic inflammatory response syndrome) ([place]) 08/14/2016  . Prolonged QT interval 08/14/2016  . Anxiety 03/06/2016  . Insomnia 03/06/2016  . Hypertension 03/06/2016  . Hepatitis C antibody test positive 02/18/2016  . Thrombocytopenia ([place]) 02/16/2016  . Normochromic normocytic anemia 02/16/2016  . Alcohol abuse 02/16/2016  . Cirrhosis of liver ([place]) 02/16/2016    Past Surgical History:  Procedure Laterality Date  . COLONOSCOPY N/A 02/17/2016   Procedure: COLONOSCOPY;  Surgeon: [name], MD;  Location: [facility] ENDOSCOPY;  Service: Endoscopy;  Laterality: N/A;  . ESOPHAGOGASTRODUODENOSCOPY N/A 02/17/2016   Procedure: ESOPHAGOGASTRODUODENOSCOPY (EGD);  Surgeon: [name], MD;  Location: [facility]  ENDOSCOPY;  Service: Endoscopy;  Laterality: N/A;  . FRACTURE SURGERY    . IR PARACENTESIS  08/14/2016  . [place]   "has steel rods in it"       Family History  Problem Relation Age of Onset  . Hypertension Other     Social History   Tobacco Use  . Smoking status: Former Smoker    Packs/day: 0.50    Years: 38.00    Pack years: 19.00    Types: Cigarettes    Quit date: 2009    Years since quitting: 12.2  . Smokeless tobacco: Never Used  Substance Use Topics  . Alcohol use: No    Comment: Quit in 4/18  . Drug use: No    Home Medications Prior to Admission medications   Medication Sig Start Date End Date Taking? Authorizing Provider  cephALEXin (KEFLEX) 500 MG capsule Take 1 capsule (500 mg total) by mouth 4 (four) times daily. Patient not taking: Reported on 03/26/2019 04/01/18   Mesner, Corene Cornea, MD  furosemide (LASIX) 40 MG tablet TAKE 1 TABLET BY MOUTH EVERY DAY Patient taking differently: Take 40 mg by mouth at bedtime.  02/08/18   Milus Banister, MD  gabapentin (NEURONTIN) 100 MG capsule Take 1 capsule (100 mg total) by mouth 3 (three) times daily. Patient not taking: Reported on 03/26/2019 04/17/18   Davonna Belling, MD  oxyCODONE-acetaminophen (PERCOCET/ROXICET) 5-325 MG tablet Take 1 tablet by mouth every 8 (  eight) hours as needed for severe pain. Patient not taking: Reported on 03/26/2019 04/17/18   Davonna Belling, MD  spironolactone (ALDACTONE) 100 MG tablet TAKE 1 TABLET BY MOUTH EVERY DAY Patient not taking: K+ sparing diuretic: Hyperkalemia may occur with decreased renal function 01/04/18   Milus Banister, MD    Allergies    Patient has no known allergies.  Review of Systems   Review of Systems  Cardiovascular: Positive for leg swelling.  Musculoskeletal: Positive for myalgias.  All other systems reviewed and are negative.   Physical Exam Updated Vital Signs BP (!) 157/107 (BP Location: Right Arm)   Pulse 83   Temp (!) 97.4 F  (36.3 C) (Oral)   Resp 17   SpO2 98%   Physical Exam Vitals and nursing note reviewed.  Constitutional:      General: He is not in acute distress.    Appearance: He is well-developed.     Comments: Appears nontoxic  HENT:     Head: Normocephalic and atraumatic.  Eyes:     General: Scleral icterus present.     Extraocular Movements: Extraocular movements intact.     Conjunctiva/sclera: Conjunctivae normal.     Pupils: Pupils are equal, round, and reactive to light.     Comments: Mild scleral icterus noted  Cardiovascular:     Rate and Rhythm: Normal rate and regular rhythm.     Pulses: Normal pulses.  Pulmonary:     Effort: Pulmonary effort is normal. No respiratory distress.     Breath sounds: Normal breath sounds. No wheezing.     Comments: Clear lung sounds in all fields.  Sats stable on room air. Abdominal:     General: There is no distension.     Palpations: Abdomen is soft. There is no mass.     Tenderness: There is no abdominal tenderness. There is no guarding or rebound.     Hernia: A hernia is present.     Comments: Ventral hernia present, baseline per chart review.  No tenderness, erythema, or firmness of the hernia.  No tenderness palpation elsewhere in the abdomen.  Musculoskeletal:        General: Normal range of motion.     Cervical back: Normal range of motion and neck supple.     Right lower leg: Edema present.     Left lower leg: Edema present.     Comments: Bilateral lower leg edema, worse on the right side.  Skin changes noted of the right leg, likely chronic.  Pedal pulses present bilaterally.  Skin:    General: Skin is warm and dry.     Capillary Refill: Capillary refill takes less than 2 seconds.  Neurological:     Mental Status: He is alert and oriented to person, place, and time.     ED Results / Procedures / Treatments   Labs (all labs ordered are listed, but only abnormal results are displayed) Labs Reviewed  CBC WITH DIFFERENTIAL/PLATELET -  Abnormal; Notable for the following components:      Result Value   Hemoglobin 11.5 (*)    MCH 25.2 (*)    MCHC 29.5 (*)    RDW 28.8 (*)    Lymphs Abs 0.5 (*)    All other components within normal limits  COMPREHENSIVE METABOLIC PANEL - Abnormal; Notable for the following components:   Sodium 133 (*)    CO2 20 (*)    Glucose, Bld 107 (*)    Calcium 8.3 (*)  Albumin 1.8 (*)    AST 61 (*)    Total Bilirubin 6.0 (*)    All other components within normal limits  MAGNESIUM  BRAIN NATRIURETIC PEPTIDE    EKG None  Radiology VAS [place] LOWER EXTREMITY VENOUS (DVT) (ONLY [facility] & [facility] 7a-7p)  Result Date: 05/06/2019  Lower Venous DVTStudy Indications: Edema.  Comparison Study: no prior Performing Technologist: Abram Sander RVS  Examination Guidelines: A complete evaluation includes B-mode imaging, spectral Doppler, color Doppler, and power Doppler as needed of all accessible portions of each vessel. Bilateral testing is considered an integral part of a complete examination. Limited examinations for reoccurring indications may be performed as noted. The reflux portion of the exam is performed with the patient in reverse Trendelenburg.  +---------+---------------+---------+-----------+----------+--------------+ RIGHT    CompressibilityPhasicitySpontaneityPropertiesThrombus Aging +---------+---------------+---------+-----------+----------+--------------+ CFV      Full           Yes      Yes                                 +---------+---------------+---------+-----------+----------+--------------+ SFJ      Full                                                        +---------+---------------+---------+-----------+----------+--------------+ FV Prox  Full                                                        +---------+---------------+---------+-----------+----------+--------------+ FV Mid   Full                                                         +---------+---------------+---------+-----------+----------+--------------+ FV DistalFull                                                        +---------+---------------+---------+-----------+----------+--------------+ PFV      Full                                                        +---------+---------------+---------+-----------+----------+--------------+ POP      Full           Yes      Yes                                 +---------+---------------+---------+-----------+----------+--------------+ PTV      Full                                                        +---------+---------------+---------+-----------+----------+--------------+  PERO                                                  Not visualized +---------+---------------+---------+-----------+----------+--------------+   +---------+---------------+---------+-----------+----------+--------------+ LEFT     CompressibilityPhasicitySpontaneityPropertiesThrombus Aging +---------+---------------+---------+-----------+----------+--------------+ CFV      Full           Yes      Yes                                 +---------+---------------+---------+-----------+----------+--------------+ SFJ      Full                                                        +---------+---------------+---------+-----------+----------+--------------+ FV Prox  Full                                                        +---------+---------------+---------+-----------+----------+--------------+ FV Mid   Full                                                        +---------+---------------+---------+-----------+----------+--------------+ FV DistalFull                                                        +---------+---------------+---------+-----------+----------+--------------+ PFV      Full                                                         +---------+---------------+---------+-----------+----------+--------------+ POP      Full           Yes      Yes                                 +---------+---------------+---------+-----------+----------+--------------+ PTV      Full                                                        +---------+---------------+---------+-----------+----------+--------------+ PERO                                                  Not visualized +---------+---------------+---------+-----------+----------+--------------+  Summary: BILATERAL: - No evidence of deep vein thrombosis seen in the lower extremities, bilaterally.   *See table(s) above for measurements and observations. Electronically signed by Deitra Mayo MD on 05/06/2019 at 3:15:22 PM.    Final     Procedures Procedures (including critical care time)  Medications Ordered in ED Medications  HYDROcodone-acetaminophen (NORCO/VICODIN) 5-325 MG per tablet 1 tablet (has no administration in time range)  furosemide (LASIX) injection 40 mg (40 mg Intravenous Given 05/06/19 1434)    ED Course  I have reviewed the triage vital signs and the nursing notes.  Pertinent labs & imaging results that were available during my care of the patient were reviewed by me and considered in my medical decision making (see chart for details).    MDM Rules/Calculators/A&P                      Patient presenting for evaluation of bilateral leg pain.  On exam, patient has significant swelling of both legs, right worse than left.  He also has skin changes of the right.  As such, will obtain ultrasound to ensure no clot.  However at this time, favor edema.  Per chart review, patient has a history of noncompliance with his medication.  He continues to drink alcohol, likely contributing to his symptoms.  However considering his age and history, will obtain labs including BNP, EKG, chest x-ray.  Will give IV Lasix and reassess.  Chest x-ray viewed  interpreted by me, no pneumonia pneumothorax, effusion.  Labs interpreted by me, shows elevated bilirubin at 6, however LFTs are normal.  Will obtain right upper quadrant ultrasound to ensure no abnormality.  Otherwise labs are reassuring.  BNP is pending.  Patient signed out to [facility], PA-C for f/u on BNP, EKG, and reassessment of sxs. If reassuring, pt can be d/c with PCP f/u.   Final Clinical Impression(s) / ED Diagnoses Final diagnoses:  None    Rx / DC Orders ED Discharge Orders    None       Franchot Heidelberg, PA-C 05/06/19 [employer], Kanai Berrios, PA-C 05/06/19 1607    Wyvonnia Dusky, MD 05/07/19 1521

## 2019-05-06 NOTE — ED Triage Notes (Signed)
Pt arrives by ems for c/o of chronic leg pain below knees, no trauma or injury. Pt is ambulatory.

## 2019-05-06 NOTE — Progress Notes (Signed)
Lower extremity venous has been completed.   Preliminary results in CV Proc.   Abram Sander 05/06/2019 3:12 PM

## 2019-05-06 NOTE — Discharge Instructions (Addendum)
Continue taking Lasix as prescribed. Use compression socks to help with leg swelling. Stop drinking alcohol, this is likely contributing to your leg swelling. Your bilirubin was elevated today, however your ultrasound looked okay without any acute findings. There is a liver mass that will require additional imaging in the future, so be sure to inform your primary care provider about this.  It is important you follow-up with your primary care doctor for recheck of this value and further evaluation as needed. Return to the emergency room with any new, sudden, concerning symptoms.

## 2019-05-15 ENCOUNTER — Inpatient Hospital Stay (HOSPITAL_COMMUNITY)
Admission: EM | Admit: 2019-05-15 | Discharge: 2019-05-17 | DRG: 441 | Disposition: A | Discharge status: Hospice | Payer: Medicare Other | Attending: Internal Medicine | Admitting: Internal Medicine

## 2019-05-15 ENCOUNTER — Emergency Department (HOSPITAL_COMMUNITY): Payer: Medicare Other

## 2019-05-15 ENCOUNTER — Other Ambulatory Visit: Payer: Self-pay

## 2019-05-15 ENCOUNTER — Encounter (HOSPITAL_COMMUNITY): Payer: Self-pay

## 2019-05-15 DIAGNOSIS — D638 Anemia in other chronic diseases classified elsewhere: Secondary | ICD-10-CM | POA: Diagnosis present

## 2019-05-15 DIAGNOSIS — K7290 Hepatic failure, unspecified without coma: Secondary | ICD-10-CM | POA: Diagnosis present

## 2019-05-15 DIAGNOSIS — D696 Thrombocytopenia, unspecified: Secondary | ICD-10-CM | POA: Diagnosis present

## 2019-05-15 DIAGNOSIS — K7460 Unspecified cirrhosis of liver: Secondary | ICD-10-CM | POA: Diagnosis present

## 2019-05-15 DIAGNOSIS — R4182 Altered mental status, unspecified: Secondary | ICD-10-CM | POA: Diagnosis present

## 2019-05-15 DIAGNOSIS — F0390 Unspecified dementia without behavioral disturbance: Secondary | ICD-10-CM | POA: Diagnosis present

## 2019-05-15 DIAGNOSIS — E119 Type 2 diabetes mellitus without complications: Secondary | ICD-10-CM

## 2019-05-15 DIAGNOSIS — M19041 Primary osteoarthritis, right hand: Secondary | ICD-10-CM | POA: Diagnosis present

## 2019-05-15 DIAGNOSIS — K7031 Alcoholic cirrhosis of liver with ascites: Secondary | ICD-10-CM | POA: Diagnosis present

## 2019-05-15 DIAGNOSIS — Z20822 Contact with and (suspected) exposure to covid-19: Secondary | ICD-10-CM | POA: Diagnosis present

## 2019-05-15 DIAGNOSIS — K420 Umbilical hernia with obstruction, without gangrene: Secondary | ICD-10-CM | POA: Diagnosis present

## 2019-05-15 DIAGNOSIS — R791 Abnormal coagulation profile: Secondary | ICD-10-CM

## 2019-05-15 DIAGNOSIS — K769 Liver disease, unspecified: Secondary | ICD-10-CM | POA: Diagnosis not present

## 2019-05-15 DIAGNOSIS — Z87891 Personal history of nicotine dependence: Secondary | ICD-10-CM | POA: Diagnosis not present

## 2019-05-15 DIAGNOSIS — R4781 Slurred speech: Secondary | ICD-10-CM | POA: Diagnosis present

## 2019-05-15 DIAGNOSIS — R410 Disorientation, unspecified: Secondary | ICD-10-CM

## 2019-05-15 DIAGNOSIS — K469 Unspecified abdominal hernia without obstruction or gangrene: Secondary | ICD-10-CM

## 2019-05-15 DIAGNOSIS — I10 Essential (primary) hypertension: Secondary | ICD-10-CM | POA: Diagnosis present

## 2019-05-15 DIAGNOSIS — R627 Adult failure to thrive: Secondary | ICD-10-CM | POA: Diagnosis present

## 2019-05-15 DIAGNOSIS — Z7189 Other specified counseling: Secondary | ICD-10-CM | POA: Diagnosis not present

## 2019-05-15 DIAGNOSIS — M19042 Primary osteoarthritis, left hand: Secondary | ICD-10-CM | POA: Diagnosis present

## 2019-05-15 DIAGNOSIS — Z515 Encounter for palliative care: Secondary | ICD-10-CM | POA: Diagnosis not present

## 2019-05-15 DIAGNOSIS — F1020 Alcohol dependence, uncomplicated: Secondary | ICD-10-CM | POA: Diagnosis present

## 2019-05-15 DIAGNOSIS — D6959 Other secondary thrombocytopenia: Secondary | ICD-10-CM | POA: Diagnosis present

## 2019-05-15 DIAGNOSIS — Z66 Do not resuscitate: Secondary | ICD-10-CM | POA: Diagnosis not present

## 2019-05-15 DIAGNOSIS — G9341 Metabolic encephalopathy: Secondary | ICD-10-CM | POA: Diagnosis present

## 2019-05-15 DIAGNOSIS — D649 Anemia, unspecified: Secondary | ICD-10-CM | POA: Diagnosis not present

## 2019-05-15 LAB — URINALYSIS, ROUTINE W REFLEX MICROSCOPIC
Bacteria, UA: NONE SEEN
Bilirubin Urine: NEGATIVE
Glucose, UA: NEGATIVE mg/dL
Ketones, ur: NEGATIVE mg/dL
Leukocytes,Ua: NEGATIVE
Nitrite: NEGATIVE
Protein, ur: NEGATIVE mg/dL
Specific Gravity, Urine: 1.041 — ABNORMAL HIGH (ref 1.005–1.030)
pH: 5 (ref 5.0–8.0)

## 2019-05-15 LAB — CBC WITH DIFFERENTIAL/PLATELET
Abs Immature Granulocytes: 0.04 10*3/uL (ref 0.00–0.07)
Basophils Absolute: 0 10*3/uL (ref 0.0–0.1)
Basophils Relative: 0 %
Eosinophils Absolute: 0 10*3/uL (ref 0.0–0.5)
Eosinophils Relative: 0 %
HCT: 30.6 % — ABNORMAL LOW (ref 39.0–52.0)
Hemoglobin: 8.8 g/dL — ABNORMAL LOW (ref 13.0–17.0)
Immature Granulocytes: 1 %
Lymphocytes Relative: 7 %
Lymphs Abs: 0.5 10*3/uL — ABNORMAL LOW (ref 0.7–4.0)
MCH: 25.2 pg — ABNORMAL LOW (ref 26.0–34.0)
MCHC: 28.8 g/dL — ABNORMAL LOW (ref 30.0–36.0)
MCV: 87.7 fL (ref 80.0–100.0)
Monocytes Absolute: 0.8 10*3/uL (ref 0.1–1.0)
Monocytes Relative: 11 %
Neutro Abs: 5.7 10*3/uL (ref 1.7–7.7)
Neutrophils Relative %: 81 %
Platelets: 78 10*3/uL — ABNORMAL LOW (ref 150–400)
RBC: 3.49 MIL/uL — ABNORMAL LOW (ref 4.22–5.81)
RDW: 25.6 % — ABNORMAL HIGH (ref 11.5–15.5)
WBC: 7 10*3/uL (ref 4.0–10.5)
nRBC: 0 % (ref 0.0–0.2)

## 2019-05-15 LAB — RETICULOCYTES
Immature Retic Fract: 35.9 % — ABNORMAL HIGH (ref 2.3–15.9)
RBC.: 3.21 MIL/uL — ABNORMAL LOW (ref 4.22–5.81)
Retic Count, Absolute: 138.7 10*3/uL (ref 19.0–186.0)
Retic Ct Pct: 4.3 % — ABNORMAL HIGH (ref 0.4–3.1)

## 2019-05-15 LAB — COMPREHENSIVE METABOLIC PANEL
ALT: 43 U/L (ref 0–44)
AST: 68 U/L — ABNORMAL HIGH (ref 15–41)
Albumin: 1.4 g/dL — ABNORMAL LOW (ref 3.5–5.0)
Alkaline Phosphatase: 64 U/L (ref 38–126)
Anion gap: 10 (ref 5–15)
BUN: 14 mg/dL (ref 8–23)
CO2: 22 mmol/L (ref 22–32)
Calcium: 7.8 mg/dL — ABNORMAL LOW (ref 8.9–10.3)
Chloride: 101 mmol/L (ref 98–111)
Creatinine, Ser: 1.11 mg/dL (ref 0.61–1.24)
GFR calc Af Amer: >60 mL/min (ref >60)
GFR calc non Af Amer: >60 mL/min (ref >60)
Glucose, Bld: 100 mg/dL — ABNORMAL HIGH (ref 70–99)
Potassium: 4.5 mmol/L (ref 3.5–5.1)
Sodium: 133 mmol/L — ABNORMAL LOW (ref 135–145)
Total Bilirubin: 4.4 mg/dL — ABNORMAL HIGH (ref 0.3–1.2)
Total Protein: 6 g/dL — ABNORMAL LOW (ref 6.5–8.1)

## 2019-05-15 LAB — IRON AND TIBC
Iron: 16 ug/dL — ABNORMAL LOW (ref 45–182)
Saturation Ratios: 6 % — ABNORMAL LOW (ref 17.9–39.5)
TIBC: 249 ug/dL — ABNORMAL LOW (ref 250–450)
UIBC: 233 ug/dL

## 2019-05-15 LAB — FOLATE: Folate: 8.1 ng/mL (ref >5.9)

## 2019-05-15 LAB — RAPID URINE DRUG SCREEN, HOSP PERFORMED
Amphetamines: NOT DETECTED
Barbiturates: NOT DETECTED
Benzodiazepines: NOT DETECTED
Cocaine: NOT DETECTED
Opiates: POSITIVE — AB
Tetrahydrocannabinol: NOT DETECTED

## 2019-05-15 LAB — MAGNESIUM: Magnesium: 1.8 mg/dL (ref 1.7–2.4)

## 2019-05-15 LAB — LIPASE, BLOOD: Lipase: 47 U/L (ref 11–51)

## 2019-05-15 LAB — TYPE AND SCREEN
ABO/RH(D): A POS
Antibody Screen: NEGATIVE

## 2019-05-15 LAB — POC OCCULT BLOOD, ED: Fecal Occult Bld: NEGATIVE

## 2019-05-15 LAB — ACETAMINOPHEN LEVEL: Acetaminophen (Tylenol), Serum: <10 ug/mL — ABNORMAL LOW (ref 10–30)

## 2019-05-15 LAB — HIV ANTIBODY (ROUTINE TESTING W REFLEX): HIV Screen 4th Generation wRfx: NONREACTIVE

## 2019-05-15 LAB — ETHANOL: Alcohol, Ethyl (B): <10 mg/dL

## 2019-05-15 LAB — HEMOGLOBIN AND HEMATOCRIT, BLOOD
HCT: 32.5 % — ABNORMAL LOW (ref 39.0–52.0)
Hemoglobin: 9.5 g/dL — ABNORMAL LOW (ref 13.0–17.0)

## 2019-05-15 LAB — VITAMIN B12: Vitamin B-12: 2038 pg/mL — ABNORMAL HIGH (ref 180–914)

## 2019-05-15 LAB — RESPIRATORY PANEL BY RT PCR (FLU A&B, COVID)
Influenza A by PCR: NEGATIVE
Influenza B by PCR: NEGATIVE
SARS Coronavirus 2 by RT PCR: NEGATIVE

## 2019-05-15 LAB — FERRITIN: Ferritin: 80 ng/mL (ref 24–336)

## 2019-05-15 LAB — PROTIME-INR
INR: 3.5 — ABNORMAL HIGH (ref 0.8–1.2)
Prothrombin Time: 34.7 s — ABNORMAL HIGH (ref 11.4–15.2)

## 2019-05-15 LAB — LACTIC ACID, PLASMA
Lactic Acid, Venous: 2.7 mmol/L (ref 0.5–1.9)
Lactic Acid, Venous: 4 mmol/L (ref 0.5–1.9)

## 2019-05-15 LAB — SALICYLATE LEVEL: Salicylate Lvl: <7 mg/dL — ABNORMAL LOW (ref 7.0–30.0)

## 2019-05-15 LAB — AMMONIA: Ammonia: 20 umol/L (ref 9–35)

## 2019-05-15 LAB — ABO/RH: ABO/RH(D): A POS

## 2019-05-15 MED ORDER — LORAZEPAM 2 MG/ML IJ SOLN: 0.0000 mg | Freq: Two times a day (BID) | INTRAMUSCULAR | Status: DC

## 2019-05-15 MED ORDER — THIAMINE HCL 100 MG PO TABS
100.0000 mg | ORAL_TABLET | Freq: Every day | ORAL | Status: DC
  Administered 2019-05-15: 100 mg via ORAL
  Filled 2019-05-15: qty 1

## 2019-05-15 MED ORDER — SODIUM CHLORIDE 0.9% IV SOLUTION: Freq: Once | INTRAVENOUS | Status: AC

## 2019-05-15 MED ORDER — LORAZEPAM 1 MG PO TABS
0.0000 mg | ORAL_TABLET | Freq: Four times a day (QID) | ORAL | Status: DC
  Administered 2019-05-15: 2 mg via ORAL
  Filled 2019-05-15: qty 2

## 2019-05-15 MED ORDER — IOHEXOL 300 MG/ML  SOLN
100.0000 mL | Freq: Once | INTRAMUSCULAR | Status: AC | PRN
  Administered 2019-05-15: 100 mL via INTRAVENOUS

## 2019-05-15 MED ORDER — LORAZEPAM 2 MG/ML IJ SOLN
0.0000 mg | Freq: Four times a day (QID) | INTRAMUSCULAR | Status: DC
  Administered 2019-05-16 (×3): 2 mg via INTRAVENOUS
  Administered 2019-05-16: 1 mg via INTRAVENOUS
  Administered 2019-05-17 (×2): 2 mg via INTRAVENOUS
  Filled 2019-05-15 (×5): qty 1

## 2019-05-15 MED ORDER — LORAZEPAM 1 MG PO TABS: 0.0000 mg | ORAL_TABLET | Freq: Two times a day (BID) | ORAL | Status: DC

## 2019-05-15 MED ORDER — SODIUM CHLORIDE 0.9 % IV SOLN
1.0000 g | INTRAVENOUS | Status: DC
  Administered 2019-05-15 – 2019-05-16 (×2): 1 g via INTRAVENOUS
  Filled 2019-05-15 (×2): qty 10
  Filled 2019-05-15: qty 1

## 2019-05-15 MED ORDER — SODIUM CHLORIDE 0.9 % IV BOLUS
500.0000 mL | Freq: Once | INTRAVENOUS | Status: AC
  Administered 2019-05-15: 500 mL via INTRAVENOUS

## 2019-05-15 MED ORDER — THIAMINE HCL 100 MG/ML IJ SOLN
100.0000 mg | Freq: Every day | INTRAMUSCULAR | Status: DC
  Administered 2019-05-16: 100 mg via INTRAVENOUS
  Filled 2019-05-15: qty 2

## 2019-05-15 NOTE — ED Notes (Signed)
Lab to add on Magnesium

## 2019-05-15 NOTE — Consult Note (Signed)
CC/Reason for consult: Incarcerated supraumbilical hernia  Requesting provider: Dr. Eulis Foster  HPI: [name] is an 69 y.o. male with hx of alcohol cirrhosis, HTN, DM, prolonged QT and reportedly altered mental status/decreased mobility as of recently. He is able to tell me that for the last 2 weeks he has had "bad" abdominal pain and points to his supraumbilical hernia. He denies nausea/vomiting. Also has noticed swelling of his legs over the last 2 weeks. Denies constipation or diarrhea. History quite limited due to his slurred speech. He does report that he lives at home with his girlfriend, [name]. He tells me he is aware he has liver disease and that he has continued to drink EtOH - "multiple times per day"   Past Medical History:  Diagnosis Date   Abdominal hernia    Arthritis    "hands" (02/16/2016)   Bright red rectal bleeding    "@ least 1-2 times/month; sometimes more" (02/16/2016)   Cirrhosis ([place])    Hypertension    Type II diabetes mellitus ([place])     Past Surgical History:  Procedure Laterality Date   COLONOSCOPY N/A 02/17/2016   Procedure: COLONOSCOPY;  Surgeon: [name], MD;  Location: [facility] ENDOSCOPY;  Service: Endoscopy;  Laterality: N/A;   ESOPHAGOGASTRODUODENOSCOPY N/A 02/17/2016   Procedure: ESOPHAGOGASTRODUODENOSCOPY (EGD);  Surgeon: [name], MD;  Location: [facility] ENDOSCOPY;  Service: Endoscopy;  Laterality: N/A;   FRACTURE SURGERY     IR PARACENTESIS  08/14/2016   PELVIC FRACTURE SURGERY  1998   "has steel rods in it"    Family History  Problem Relation Age of Onset   Hypertension Other     Social:  reports that he quit smoking about 12 years ago. His smoking use included cigarettes. He has a 19.00 pack-year smoking history. He has never used smokeless tobacco. He reports that he does not drink alcohol or use drugs.  Allergies: No Known Allergies  Medications: I have reviewed the patient's current medications.  Results for orders placed or  performed during the hospital encounter of 05/15/19 (from the past 48 hour(s))  Comprehensive metabolic panel     Status: Abnormal   Collection Time: 05/15/19  3:12 PM  Result Value Ref Range   Sodium 133 (L) 135 - 145 mmol/L   Potassium 4.5 3.5 - 5.1 mmol/L   Chloride 101 98 - 111 mmol/L   CO2 22 22 - 32 mmol/L   Glucose, Bld 100 (H) 70 - 99 mg/dL    Comment: Glucose reference range applies only to samples taken after fasting for at least 8 hours.   BUN 14 8 - 23 mg/dL   Creatinine, Ser 1.11 0.61 - 1.24 mg/dL   Calcium 7.8 (L) 8.9 - 10.3 mg/dL   Total Protein 6.0 (L) 6.5 - 8.1 g/dL   Albumin 1.4 (L) 3.5 - 5.0 g/dL   AST 68 (H) 15 - 41 U/L   ALT 43 0 - 44 U/L   Alkaline Phosphatase 64 38 - 126 U/L   Total Bilirubin 4.4 (H) 0.3 - 1.2 mg/dL   GFR calc non Af Amer >60 >60 mL/min   GFR calc Af Amer >60 >60 mL/min   Anion gap 10 5 - 15    Comment: Performed at [facility] [address], [place], [place] [address]  Lipase, blood     Status: None   Collection Time: 05/15/19  3:12 PM  Result Value Ref Range   Lipase 47 11 - 51 U/L    Comment: Performed  at [facility], [place] [address], [place], [place] [address]  Protime-INR     Status: Abnormal   Collection Time: 05/15/19  3:12 PM  Result Value Ref Range   Prothrombin Time 34.7 (H) 11.4 - 15.2 seconds   INR 3.5 (H) 0.8 - 1.2    Comment: (NOTE) INR goal varies based on device and disease states. Performed at [facility], [place] [address] [place], [place] [address]   CBC with Differential     Status: Abnormal   Collection Time: 05/15/19  3:12 PM  Result Value Ref Range   WBC 7.0 4.0 - 10.5 K/uL   RBC 3.49 (L) 4.22 - 5.81 MIL/uL   Hemoglobin 8.8 (L) 13.0 - 17.0 g/dL   HCT 30.6 (L) 39.0 - 52.0 %   MCV 87.7 80.0 - 100.0 fL   MCH 25.2 (L) 26.0 - 34.0 pg   MCHC 28.8 (L) 30.0 - 36.0 g/dL   RDW 25.6 (H) 11.5 - 15.5 %   Platelets 78 (L) 150 - 400 K/uL    Comment: Immature Platelet Fraction may be clinically  indicated, consider ordering this additional test [id number] PLATELET COUNT CONFIRMED BY SMEAR REPEATED TO VERIFY    nRBC 0.0 0.0 - 0.2 %   Neutrophils Relative % 81 %   Neutro Abs 5.7 1.7 - 7.7 K/uL   Lymphocytes Relative 7 %   Lymphs Abs 0.5 (L) 0.7 - 4.0 K/uL   Monocytes Relative 11 %   Monocytes Absolute 0.8 0.1 - 1.0 K/uL   Eosinophils Relative 0 %   Eosinophils Absolute 0.0 0.0 - 0.5 K/uL   Basophils Relative 0 %   Basophils Absolute 0.0 0.0 - 0.1 K/uL   Immature Granulocytes 1 %   Abs Immature Granulocytes 0.04 0.00 - 0.07 K/uL    Comment: Performed at [facility], [place] [address] [place], [place] [address]  Ammonia     Status: None   Collection Time: 05/15/19  3:12 PM  Result Value Ref Range   Ammonia 20 9 - 35 umol/L    Comment: Performed at [facility], [place] [address], [place], [place] [address]  Ethanol     Status: None   Collection Time: 05/15/19  3:12 PM  Result Value Ref Range   Alcohol, Ethyl (B) <10 <10 mg/dL    Comment: (NOTE) Lowest detectable limit for serum alcohol is 10 mg/dL. For medical purposes only. Performed at [facility], [place] [address] [place], [place] [address]   Magnesium     Status: None   Collection Time: 05/15/19  3:12 PM  Result Value Ref Range   Magnesium 1.8 1.7 - 2.4 mg/dL    Comment: Performed at [facility] [address], [place], [place] [address]  Respiratory Panel by RT PCR (Flu A&B, Covid) - Nasopharyngeal Swab     Status: None   Collection Time: 05/15/19  5:19 PM   Specimen: Nasopharyngeal Swab  Result Value Ref Range   SARS Coronavirus 2 by RT PCR NEGATIVE NEGATIVE    Comment: (NOTE) SARS-CoV-2 target nucleic acids are NOT DETECTED. The SARS-CoV-2 RNA is generally detectable in upper respiratoy specimens during the acute phase of infection. The lowest concentration of SARS-CoV-2 viral copies this assay can detect is 131 copies/mL. A negative result does not preclude SARS-Cov-2 infection and  should not be used as the sole basis for treatment or other patient management decisions. A negative result may occur with  improper  specimen collection/handling, submission of specimen other than nasopharyngeal swab, presence of viral mutation(s) within the areas targeted by this assay, and inadequate number of viral copies (<131 copies/mL). A negative result must be combined with clinical observations, patient history, and epidemiological information. The expected result is Negative. Fact Sheet for Patients:  PinkCheek.be Fact Sheet for Healthcare Providers:  GravelBags.it This test is not yet ap proved or cleared by the [place] FDA and  has been authorized for detection and/or diagnosis of SARS-CoV-2 by FDA under an Emergency Use Authorization (EUA). This EUA will remain  in effect (meaning this test can be used) for the duration of the COVID-19 declaration under Section 564(b)(1) of the Act, 21 U.S.C. section 360bbb-3(b)(1), unless the authorization is terminated or revoked sooner.    Influenza A by PCR NEGATIVE NEGATIVE   Influenza B by PCR NEGATIVE NEGATIVE    Comment: (NOTE) The Xpert Xpress SARS-CoV-2/FLU/RSV assay is intended as an aid in  the diagnosis of influenza from Nasopharyngeal swab specimens and  should not be used as a sole basis for treatment. Nasal washings and  aspirates are unacceptable for Xpert Xpress SARS-CoV-2/FLU/RSV  testing. Fact Sheet for Patients: PinkCheek.be Fact Sheet for Healthcare Providers: GravelBags.it This test is not yet approved or cleared by the [place] FDA and  has been authorized for detection and/or diagnosis of SARS-CoV-2 by  FDA under an Emergency Use Authorization (EUA). This EUA will remain  in effect (meaning this test can be used) for the duration of the  Covid-19 declaration under Section 564(b)(1) of  the Act, 21  U.S.C. section 360bbb-3(b)(1), unless the authorization is  terminated or revoked. Performed at [facility], [place] [address], [place], [place] [address]   POC occult blood, ED     Status: None   Collection Time: 05/15/19  6:21 PM  Result Value Ref Range   Fecal Occult Bld NEGATIVE NEGATIVE  Vitamin B12     Status: Abnormal   Collection Time: 05/15/19  7:11 PM  Result Value Ref Range   Vitamin B-12 2,038 (H) 180 - 914 pg/mL    Comment: (NOTE) This assay is not validated for testing neonatal or myeloproliferative syndrome specimens for Vitamin B12 levels. Performed at [facility], [place] [address], [place], [place] [address]   Folate     Status: None   Collection Time: 05/15/19  7:11 PM  Result Value Ref Range   Folate 8.1 >5.9 ng/mL    Comment: Performed at [facility] [address], [place], [place] [address]  Iron and TIBC     Status: Abnormal   Collection Time: 05/15/19  7:11 PM  Result Value Ref Range   Iron 16 (L) 45 - 182 ug/dL   TIBC 249 (L) 250 - 450 ug/dL   Saturation Ratios 6 (L) 17.9 - 39.5 %   UIBC 233 ug/dL    Comment: Performed at [facility] [address] [place], [place] [address]  Ferritin     Status: None   Collection Time: 05/15/19  7:11 PM  Result Value Ref Range   Ferritin 80 24 - 336 ng/mL    Comment: Performed at [facility] [address], [place], [place] [address]  Reticulocytes     Status: Abnormal   Collection Time: 05/15/19  7:11 PM  Result Value Ref Range   Retic Ct Pct 4.3 (H) 0.4 - 3.1 %   RBC. 3.21 (L) 4.22 - 5.81 MIL/uL  Retic Count, Absolute 138.7 19.0 - 186.0 K/uL   Immature Retic Fract 35.9 (H) 2.3 - 15.9 %    Comment: Performed at [facility] [address] [place], [place] [address]  Hemoglobin and hematocrit, blood     Status: Abnormal   Collection Time: 05/15/19  7:11 PM  Result Value Ref Range   Hemoglobin 9.5 (L) 13.0 - 17.0 g/dL   HCT 32.5 (L) 39.0 - 52.0 %     Comment: Performed at [facility] [address] [place], [place] [address]  Acetaminophen level     Status: Abnormal   Collection Time: 05/15/19  7:11 PM  Result Value Ref Range   Acetaminophen (Tylenol), Serum <10 (L) 10 - 30 ug/mL    Comment: (NOTE) Therapeutic concentrations vary significantly. A range of 10-30 ug/mL  may be an effective concentration for many patients. However, some  are best treated at concentrations outside of this range. Acetaminophen concentrations >150 ug/mL at 4 hours after ingestion  and >50 ug/mL at 12 hours after ingestion are often associated with  toxic reactions. Performed at [facility], [place] [address], [place], [place] [id number]   Salicylate level     Status: Abnormal   Collection Time: 05/15/19  7:11 PM  Result Value Ref Range   Salicylate Lvl [id number] (L) 7.0 - 30.0 mg/dL    Comment: Performed at [facility] [address], [place], [place] [address]  Lactic acid, plasma     Status: Abnormal   Collection Time: 05/15/19  7:11 PM  Result Value Ref Range   Lactic Acid, Venous 2.7 (HH) 0.5 - 1.9 mmol/L    Comment: CRITICAL RESULT CALLED TO, READ BACK BY AND VERIFIED WITH: Junita Push RN [id number] [address]on Performed at [facility], [address] [place], [place] [address]   Type and screen [place]     Status: None   Collection Time: 05/15/19  7:15 PM  Result Value Ref Range   ABO/RH(D) A POS    Antibody Screen NEG    Sample Expiration      05/18/2019,2359 Performed at [facility], [place] [address], [place], [place] [address]   ABO/Rh     Status: None   Collection Time: 05/15/19  7:15 PM  Result Value Ref Range   ABO/RH(D)      A POS Performed at [facility] [address] [place], [place] [address]   Urinalysis, Routine w reflex microscopic     Status: Abnormal   Collection Time: 05/15/19  8:49 PM  Result Value Ref Range   Color, Urine AMBER (A) YELLOW    Comment:  BIOCHEMICALS MAY BE AFFECTED BY COLOR   APPearance CLEAR CLEAR   Specific Gravity, Urine 1.041 (H) 1.005 - 1.030   pH 5.0 5.0 - 8.0   Glucose, UA NEGATIVE NEGATIVE mg/dL   Hgb urine dipstick MODERATE (A) NEGATIVE   Bilirubin Urine NEGATIVE NEGATIVE   Ketones, ur NEGATIVE NEGATIVE mg/dL   Protein, ur NEGATIVE NEGATIVE mg/dL   Nitrite NEGATIVE NEGATIVE   Leukocytes,Ua NEGATIVE NEGATIVE   RBC / HPF 6-10 0 - 5 RBC/hpf   WBC, UA 0-5 0 - 5 WBC/hpf   Bacteria, UA NONE SEEN NONE SEEN   Squamous Epithelial / LPF 0-5 0 - 5   Mucus PRESENT    Hyaline Casts, UA PRESENT     Comment: Performed at [facility], [place]  [address], [place], [place] [address]  Rapid urine drug screen (hospital performed)     Status: Abnormal   Collection Time: 05/15/19  8:49 PM  Result Value Ref Range   Opiates POSITIVE (A) NONE DETECTED   Cocaine NONE DETECTED NONE DETECTED   Benzodiazepines NONE DETECTED NONE DETECTED   Amphetamines NONE DETECTED NONE DETECTED   Tetrahydrocannabinol NONE DETECTED NONE DETECTED   Barbiturates NONE DETECTED NONE DETECTED    Comment: (NOTE) DRUG SCREEN FOR MEDICAL PURPOSES ONLY.  IF CONFIRMATION IS NEEDED FOR ANY PURPOSE, NOTIFY LAB WITHIN 5 DAYS. LOWEST DETECTABLE LIMITS FOR URINE DRUG SCREEN Drug Class                     Cutoff (ng/mL) Amphetamine and metabolites    1000 Barbiturate and metabolites    200 Benzodiazepine                 [id number] Tricyclics and metabolites     300 Opiates and metabolites        300 Cocaine and metabolites        300 THC                            50 Performed at [facility], [place] [address], [place], [place] [address]   Lactic acid, plasma     Status: Abnormal   Collection Time: 05/15/19 10:00 PM  Result Value Ref Range   Lactic Acid, Venous 4.0 (HH) 0.5 - 1.9 mmol/L    Comment: CRITICAL VALUE NOTED.  VALUE IS CONSISTENT WITH PREVIOUSLY REPORTED AND CALLED VALUE. Performed at [facility], [place] [address], [place],  [place] [address]   HIV Antibody (routine testing w rflx)     Status: None   Collection Time: 05/15/19 10:00 PM  Result Value Ref Range   HIV Screen 4th Generation wRfx NON REACTIVE NON REACTIVE    Comment: Performed at [facility], [place] [address] [place], [place] [address]  Prepare fresh frozen plasma     Status: None (Preliminary result)   Collection Time: 05/15/19 11:41 PM  Result Value Ref Range   Unit Number [id number]    Blood Component Type THW PLS APHR    Unit division B0    Status of Unit ALLOCATED    Transfusion Status OK TO TRANSFUSE    Unit Number [id number]    Blood Component Type THW PLS APHR    Unit division B0    Status of Unit ALLOCATED    Transfusion Status OK TO TRANSFUSE     CT Head Wo Contrast  Result Date: 05/15/2019 CLINICAL DATA:  69 year old male with altered mental status. EXAM: CT HEAD WITHOUT CONTRAST TECHNIQUE: Contiguous axial images were obtained from the base of the skull through the vertex without intravenous contrast. COMPARISON:  01/25/2011 and prior CTs FINDINGS: Brain: No evidence of acute infarction, hemorrhage, hydrocephalus, extra-axial collection or mass lesion/mass effect. Atrophy and chronic small-vessel Aivy Akter matter ischemic changes again noted. Vascular: No hyperdense vessel or unexpected calcification. Skull: Normal. Negative for fracture or focal lesion. Sinuses/Orbits: No acute finding. Other: None. IMPRESSION: No evidence of acute intracranial abnormality. Atrophy and chronic small-vessel Gerard Cantara matter ischemic changes. Electronically Signed   By: Margarette Canada M.D.   On: 05/15/2019 16:20   CT Abdomen Pelvis W Contrast  Result Date: 05/15/2019 CLINICAL DATA:  Altered mental status, unclear cause, nonlocalized EXAM: CT ABDOMEN AND PELVIS WITH CONTRAST TECHNIQUE:  Multidetector CT imaging of the abdomen and pelvis was performed using the standard protocol following bolus administration of intravenous contrast. CONTRAST:  158mL OMNIPAQUE  IOHEXOL 300 MG/ML  SOLN COMPARISON:  CT 02/16/2016 FINDINGS: Lower chest: Trace bilateral effusions, right slightly greater than left with adjacent atelectatic changes. Normal heart size. No pericardial effusion. Hepatobiliary: Heterogeneity of the hepatic attenuation with a nodular liver surface contour compatible with cirrhosis. There is an ill-defined region some faint peripheral contrast enhancement and centrally isoattenuating tissue in segment III of the liver measuring approximately 3.2 cm (coronal 6/43). This finding likely corresponds to the isoechoic lesion seen on recent abdominal ultrasound. There is a more centrally hypoattenuating focus in the posterior right lobe liver measuring 1.5 cm in size which is not clearly present on comparison studies. Minimal gallbladder wall thickening without focal pericholecystic inflammation. Finding is nonspecific in the setting of intrinsic liver disease. No visible calcified gallstones or biliary ductal dilatation. Pancreas: Diffuse mild pancreatic atrophy. No focal peripancreatic inflammation or ductal dilatation. Spleen: Borderline splenomegaly. Few punctate calcifications in the spleen likely reflect sequela of prior granulomatous disease. Adrenals/Urinary Tract: Normal adrenal glands. Kidneys enhance and excrete symmetrically. No concerning renal mass, urolith or hydronephrosis. Urinary bladder is unremarkable. Stomach/Bowel: Distal esophagus, stomach and duodenum are unremarkable. A small portion bowels is seen protruding into a small umbilical hernia anteriorly with loculated free fluid within the hernia sac, hernia defect measures approximately 2 x 1.6 cm in transverse by craniocaudal dimension. No evidence of high-grade upstream obstruction. Some mild thickening of the bowel is nonspecific but could suggest some early vascular compromise. More distal small bowel is unremarkable. There is some mild edematous mural thickening predominately of the proximal colon  to the level of the hepatic flexure. Nonspecific given additional features liver disease. Vascular/Lymphatic: Atherosclerotic plaque within the normal caliber aorta. No suspicious or enlarged lymph nodes in the included lymphatic chains. Reproductive: Coarse eccentric calcification of the prostate. No concerning abnormalities of the prostate or seminal vesicles. Other: Small to moderate volume ascites predominantly within the subphrenic spaces. Bowel containing ventral hernia arising 3.1 cm superior to the umbilicus. Better detailed in the bowel section above. No other bowel containing hernia is seen. Mild circumferential body wall edema. No abdominopelvic free air. Musculoskeletal: Multilevel degenerative changes are present in the imaged portions of the spine. Prior fusion across the SI joints. Bilateral L5 pars defects are noted with grade 1 anterolisthesis L5 on S1. Multilevel degenerative changes are present in the imaged portions of the spine. Degenerative changes most pronounced at the L5-S1 level with vacuum disc phenomenon. Remote posttraumatic changes of the bilateral pubic rami and pubic bodies. IMPRESSION: 1. Cirrhosis with a 3.2 cm ill-defined region in segment III of the liver likely corresponds to the isoechoic lesion seen on recent abdominal ultrasound. Such finding is highly worrisome for potential malignancy in the setting of intrinsic liver disease. 2. Centrally hypoattenuating focus in the posterior right lobe liver measuring 1.5 cm in size which is not clearly present on comparison studies. While this could reflect benign hepatic cysts, a more insidious lesion such as [place] could have a similar appearance. 3. Small to moderate volume ascites predominantly within the subphrenic spaces. Mild circumferential body wall edema. 4. Small bowel containing supraumbilical ventral hernia without resulting obstruction but some mild thickening and hyperemia of the bowel is nonspecific but could suggest some  early vascular compromise. Correlate with exam findings and potential reduceability. 5. Features of anasarca with bilateral effusions, ascites, and body wall edema. 6.  Minimal gallbladder wall thickening without focal pericholecystic inflammation. Finding is nonspecific in the setting of intrinsic liver disease. 7. Bilateral L5 pars defects with grade 1 anterolisthesis L5 on S1. 8. Aortic Atherosclerosis (ICD10-I70.0). These results were called by telephone at the time of interpretation on 05/15/2019 at 8:33 pm to provider [facility] , who verbally acknowledged these results. Electronically Signed   By: Lovena Le M.D.   On: 05/15/2019 20:33   DG Chest Port 1 View  Result Date: 05/15/2019 CLINICAL DATA:  Confusion, failure to thrive EXAM: PORTABLE CHEST 1 VIEW COMPARISON:  Radiograph 03/26/2019 FINDINGS: Low volumes with streaky opacities in the lung bases favoring atelectasis. No focal consolidative opacity, pneumothorax or effusion. Mild cardiomegaly is similar to comparison portable radiographs. No acute osseous or soft tissue abnormality. Degenerative changes are present in the imaged spine and shoulders. Telemetry leads overlie the chest. IMPRESSION: 1. Low volumes with streaky opacities in the lung bases favoring atelectasis. 2. Mild cardiomegaly without edema or effusion. Electronically Signed   By: Lovena Le M.D.   On: 05/15/2019 17:41    ROS - all of the below systems have been reviewed with the patient and positives are indicated with bold text General: chills, fever or night sweats Eyes: blurry vision or double vision ENT: epistaxis or sore throat Allergy/Immunology: itchy/watery eyes or nasal congestion Hematologic/Lymphatic: bleeding problems, blood clots or swollen lymph nodes Endocrine: temperature intolerance or unexpected weight changes Breast: new or changing breast lumps or nipple discharge Resp: cough, shortness of breath, or wheezing CV: chest pain or dyspnea on  exertion GI: as per HPI GU: dysuria, trouble voiding, or hematuria MSK: joint pain or joint stiffness Neuro: TIA or stroke symptoms Derm: pruritus and skin lesion changes Psych: anxiety and depression  PE Blood pressure (!) 152/82, pulse 74, temperature 98.3 F (36.8 C), temperature source Axillary, resp. rate 17, height 5\' 11"  (1.803 m), SpO2 99 %. Constitutional: NAD; conversant; no deformities; wearing mask; jaundiced Eyes: Moist conjunctiva; no lid lag; icteric sclera; pupils equal and round Neck: Trachea midline; no thyromegaly Lungs: Normal respiratory effort; no tactile fremitus CV: RRR; no palpable thrills; pitting edema to bilateral lower ext GI: Abd soft, not significantly distended; focally tender around supraumbilical hernia which does appear incarcerated. No overlying skin changes.  MSK: Normal range of motion of extremities; no clubbing/cyanosis; skin graft rle  Psychiatric: Appropriate affect; alert, oriented to person and place Lymphatic: No palpable cervical or axillary lymphadenopathy  Results for orders placed or performed during the hospital encounter of 05/15/19 (from the past 48 hour(s))  Comprehensive metabolic panel     Status: Abnormal   Collection Time: 05/15/19  3:12 PM  Result Value Ref Range   Sodium 133 (L) 135 - 145 mmol/L   Potassium 4.5 3.5 - 5.1 mmol/L   Chloride 101 98 - 111 mmol/L   CO2 22 22 - 32 mmol/L   Glucose, Bld 100 (H) 70 - 99 mg/dL    Comment: Glucose reference range applies only to samples taken after fasting for at least 8 hours.   BUN 14 8 - 23 mg/dL   Creatinine, Ser 1.11 0.61 - 1.24 mg/dL   Calcium 7.8 (L) 8.9 - 10.3 mg/dL   Total Protein 6.0 (L) 6.5 - 8.1 g/dL   Albumin 1.4 (L) 3.5 - 5.0 g/dL   AST 68 (H) 15 - 41 U/L   ALT 43 0 - 44 U/L   Alkaline Phosphatase 64 38 - 126 U/L   Total Bilirubin 4.4 (H)  0.3 - 1.2 mg/dL   GFR calc non Af Amer >60 >60 mL/min   GFR calc Af Amer >60 >60 mL/min   Anion gap 10 5 - 15    Comment:  Performed at [facility] [address] [place], [place] [address]  Lipase, blood     Status: None   Collection Time: 05/15/19  3:12 PM  Result Value Ref Range   Lipase 47 11 - 51 U/L    Comment: Performed at [facility] [address], [place], [place] [address]  Protime-INR     Status: Abnormal   Collection Time: 05/15/19  3:12 PM  Result Value Ref Range   Prothrombin Time 34.7 (H) 11.4 - 15.2 seconds   INR 3.5 (H) 0.8 - 1.2    Comment: (NOTE) INR goal varies based on device and disease states. Performed at [facility], [place] [address] [place], [place] [address]   CBC with Differential     Status: Abnormal   Collection Time: 05/15/19  3:12 PM  Result Value Ref Range   WBC 7.0 4.0 - 10.5 K/uL   RBC 3.49 (L) 4.22 - 5.81 MIL/uL   Hemoglobin 8.8 (L) 13.0 - 17.0 g/dL   HCT 30.6 (L) 39.0 - 52.0 %   MCV 87.7 80.0 - 100.0 fL   MCH 25.2 (L) 26.0 - 34.0 pg   MCHC 28.8 (L) 30.0 - 36.0 g/dL   RDW 25.6 (H) 11.5 - 15.5 %   Platelets 78 (L) 150 - 400 K/uL    Comment: Immature Platelet Fraction may be clinically indicated, consider ordering this additional test [id number] PLATELET COUNT CONFIRMED BY SMEAR REPEATED TO VERIFY    nRBC 0.0 0.0 - 0.2 %   Neutrophils Relative % 81 %   Neutro Abs 5.7 1.7 - 7.7 K/uL   Lymphocytes Relative 7 %   Lymphs Abs 0.5 (L) 0.7 - 4.0 K/uL   Monocytes Relative 11 %   Monocytes Absolute 0.8 0.1 - 1.0 K/uL   Eosinophils Relative 0 %   Eosinophils Absolute 0.0 0.0 - 0.5 K/uL   Basophils Relative 0 %   Basophils Absolute 0.0 0.0 - 0.1 K/uL   Immature Granulocytes 1 %   Abs Immature Granulocytes 0.04 0.00 - 0.07 K/uL    Comment: Performed at [facility], [place] [address], [place], [place] [address]  Ammonia     Status: None   Collection Time: 05/15/19  3:12 PM  Result Value Ref Range   Ammonia 20 9 - 35 umol/L    Comment: Performed at [facility], [place] [address], [place], [place] [address]  Ethanol     Status: None    Collection Time: 05/15/19  3:12 PM  Result Value Ref Range   Alcohol, Ethyl (B) <10 <10 mg/dL    Comment: (NOTE) Lowest detectable limit for serum alcohol is 10 mg/dL. For medical purposes only. Performed at [facility], [place] [address], [place], [place] [address]   Magnesium     Status: None   Collection Time: 05/15/19  3:12 PM  Result Value Ref Range   Magnesium 1.8 1.7 - 2.4 mg/dL    Comment: Performed at [facility] [address], [place], [place] [address]  Respiratory Panel by RT PCR (Flu A&B, Covid) - Nasopharyngeal Swab     Status: None   Collection Time: 05/15/19  5:19 PM   Specimen: Nasopharyngeal Swab  Result Value Ref Range  SARS Coronavirus 2 by RT PCR NEGATIVE NEGATIVE    Comment: (NOTE) SARS-CoV-2 target nucleic acids are NOT DETECTED. The SARS-CoV-2 RNA is generally detectable in upper respiratoy specimens during the acute phase of infection. The lowest concentration of SARS-CoV-2 viral copies this assay can detect is 131 copies/mL. A negative result does not preclude SARS-Cov-2 infection and should not be used as the sole basis for treatment or other patient management decisions. A negative result may occur with  improper specimen collection/handling, submission of specimen other than nasopharyngeal swab, presence of viral mutation(s) within the areas targeted by this assay, and inadequate number of viral copies (<131 copies/mL). A negative result must be combined with clinical observations, patient history, and epidemiological information. The expected result is Negative. Fact Sheet for Patients:  PinkCheek.be Fact Sheet for Healthcare Providers:  GravelBags.it This test is not yet ap proved or cleared by the [place] FDA and  has been authorized for detection and/or diagnosis of SARS-CoV-2 by FDA under an Emergency Use Authorization (EUA). This EUA will remain  in effect (meaning  this test can be used) for the duration of the COVID-19 declaration under Section 564(b)(1) of the Act, 21 U.S.C. section 360bbb-3(b)(1), unless the authorization is terminated or revoked sooner.    Influenza A by PCR NEGATIVE NEGATIVE   Influenza B by PCR NEGATIVE NEGATIVE    Comment: (NOTE) The Xpert Xpress SARS-CoV-2/FLU/RSV assay is intended as an aid in  the diagnosis of influenza from Nasopharyngeal swab specimens and  should not be used as a sole basis for treatment. Nasal washings and  aspirates are unacceptable for Xpert Xpress SARS-CoV-2/FLU/RSV  testing. Fact Sheet for Patients: PinkCheek.be Fact Sheet for Healthcare Providers: GravelBags.it This test is not yet approved or cleared by the [place] FDA and  has been authorized for detection and/or diagnosis of SARS-CoV-2 by  FDA under an Emergency Use Authorization (EUA). This EUA will remain  in effect (meaning this test can be used) for the duration of the  Covid-19 declaration under Section 564(b)(1) of the Act, 21  U.S.C. section 360bbb-3(b)(1), unless the authorization is  terminated or revoked. Performed at [facility], [place] [address] [place], [place] [address]   POC occult blood, ED     Status: None   Collection Time: 05/15/19  6:21 PM  Result Value Ref Range   Fecal Occult Bld NEGATIVE NEGATIVE  Vitamin B12     Status: Abnormal   Collection Time: 05/15/19  7:11 PM  Result Value Ref Range   Vitamin B-12 2,038 (H) 180 - 914 pg/mL    Comment: (NOTE) This assay is not validated for testing neonatal or myeloproliferative syndrome specimens for Vitamin B12 levels. Performed at [facility], [place] [address] [place], [place] [address]   Folate     Status: None   Collection Time: 05/15/19  7:11 PM  Result Value Ref Range   Folate 8.1 >5.9 ng/mL    Comment: Performed at [facility] [address], [place], [place] [address]  Iron  and TIBC     Status: Abnormal   Collection Time: 05/15/19  7:11 PM  Result Value Ref Range   Iron 16 (L) 45 - 182 ug/dL   TIBC 249 (L) 250 - 450 ug/dL   Saturation Ratios 6 (L) 17.9 - 39.5 %   UIBC 233 ug/dL    Comment: Performed at [facility] [address], [place], [place] [address]  Ferritin  Status: None   Collection Time: 05/15/19  7:11 PM  Result Value Ref Range   Ferritin 80 24 - 336 ng/mL    Comment: Performed at [facility], [place] [address], [place], [place] [address]  Reticulocytes     Status: Abnormal   Collection Time: 05/15/19  7:11 PM  Result Value Ref Range   Retic Ct Pct 4.3 (H) 0.4 - 3.1 %   RBC. 3.21 (L) 4.22 - 5.81 MIL/uL   Retic Count, Absolute 138.7 19.0 - 186.0 K/uL   Immature Retic Fract 35.9 (H) 2.3 - 15.9 %    Comment: Performed at [facility] [address], [place], [place] [address]  Hemoglobin and hematocrit, blood     Status: Abnormal   Collection Time: 05/15/19  7:11 PM  Result Value Ref Range   Hemoglobin 9.5 (L) 13.0 - 17.0 g/dL   HCT 32.5 (L) 39.0 - 52.0 %    Comment: Performed at [facility] [address] [place], [place] [address]  Acetaminophen level     Status: Abnormal   Collection Time: 05/15/19  7:11 PM  Result Value Ref Range   Acetaminophen (Tylenol), Serum <10 (L) 10 - 30 ug/mL    Comment: (NOTE) Therapeutic concentrations vary significantly. A range of 10-30 ug/mL  may be an effective concentration for many patients. However, some  are best treated at concentrations outside of this range. Acetaminophen concentrations >150 ug/mL at 4 hours after ingestion  and >50 ug/mL at 12 hours after ingestion are often associated with  toxic reactions. Performed at [facility], [place] [address] [place], [place] [id number]   Salicylate level     Status: Abnormal   Collection Time: 05/15/19  7:11 PM  Result Value Ref Range   Salicylate Lvl [id number] (L) 7.0 - 30.0 mg/dL    Comment: Performed at [facility] [address] [place], [place] [address]  Lactic acid, plasma     Status: Abnormal   Collection Time: 05/15/19  7:11 PM  Result Value Ref Range   Lactic Acid, Venous 2.7 (HH) 0.5 - 1.9 mmol/L    Comment: CRITICAL RESULT CALLED TO, READ BACK BY AND VERIFIED WITH: Junita Push RN [id number] [address] Radon Performed at [facility], [address] [place], [place] [address]   Type and screen [place]     Status: None   Collection Time: 05/15/19  7:15 PM  Result Value Ref Range   ABO/RH(D) A POS    Antibody Screen NEG    Sample Expiration      05/18/2019,2359 Performed at [facility], [place] [address] [place], [place] [address]   ABO/Rh     Status: None   Collection Time: 05/15/19  7:15 PM  Result Value Ref Range   ABO/RH(D)      A POS Performed at [facility] [address], [place],  [address]   Urinalysis, Routine w reflex microscopic     Status: Abnormal   Collection Time: 05/15/19  8:49 PM  Result Value Ref Range   Color, Urine AMBER (A) YELLOW    Comment: BIOCHEMICALS MAY BE AFFECTED BY COLOR   APPearance CLEAR CLEAR   Specific Gravity, Urine 1.041 (H) 1.005 - 1.030   pH 5.0 5.0 - 8.0   Glucose, UA NEGATIVE NEGATIVE mg/dL   Hgb urine dipstick MODERATE (A) NEGATIVE   Bilirubin Urine NEGATIVE NEGATIVE   Ketones,  ur NEGATIVE NEGATIVE mg/dL   Protein, ur NEGATIVE NEGATIVE mg/dL   Nitrite NEGATIVE NEGATIVE   Leukocytes,Ua NEGATIVE NEGATIVE   RBC / HPF 6-10 0 - 5 RBC/hpf   WBC, UA 0-5 0 - 5 WBC/hpf   Bacteria, UA NONE SEEN NONE SEEN   Squamous Epithelial / LPF 0-5 0 - 5   Mucus PRESENT    Hyaline Casts, UA PRESENT     Comment: Performed at [facility], [place] [address], [place], [place] [address]  Rapid urine drug screen (hospital performed)     Status: Abnormal   Collection Time: 05/15/19  8:49 PM  Result Value Ref Range   Opiates POSITIVE (A) NONE DETECTED   Cocaine NONE DETECTED NONE DETECTED    Benzodiazepines NONE DETECTED NONE DETECTED   Amphetamines NONE DETECTED NONE DETECTED   Tetrahydrocannabinol NONE DETECTED NONE DETECTED   Barbiturates NONE DETECTED NONE DETECTED    Comment: (NOTE) DRUG SCREEN FOR MEDICAL PURPOSES ONLY.  IF CONFIRMATION IS NEEDED FOR ANY PURPOSE, NOTIFY LAB WITHIN 5 DAYS. LOWEST DETECTABLE LIMITS FOR URINE DRUG SCREEN Drug Class                     Cutoff (ng/mL) Amphetamine and metabolites    1000 Barbiturate and metabolites    200 Benzodiazepine                 [id number] Tricyclics and metabolites     300 Opiates and metabolites        300 Cocaine and metabolites        300 THC                            50 Performed at [facility], [place] [address], [place], [place] [address]   Lactic acid, plasma     Status: Abnormal   Collection Time: 05/15/19 10:00 PM  Result Value Ref Range   Lactic Acid, Venous 4.0 (HH) 0.5 - 1.9 mmol/L    Comment: CRITICAL VALUE NOTED.  VALUE IS CONSISTENT WITH PREVIOUSLY REPORTED AND CALLED VALUE. Performed at [facility], [place] [address] [place], [place] [address]   HIV Antibody (routine testing w rflx)     Status: None   Collection Time: 05/15/19 10:00 PM  Result Value Ref Range   HIV Screen 4th Generation wRfx NON REACTIVE NON REACTIVE    Comment: Performed at [facility], [place] [address], [place], [place] [address]  Prepare fresh frozen plasma     Status: None (Preliminary result)   Collection Time: 05/15/19 11:41 PM  Result Value Ref Range   Unit Number [id number]    Blood Component Type THW PLS APHR    Unit division B0    Status of Unit ALLOCATED    Transfusion Status OK TO TRANSFUSE    Unit Number [id number]    Blood Component Type THW PLS APHR    Unit division B0    Status of Unit ALLOCATED    Transfusion Status OK TO TRANSFUSE     CT Head Wo Contrast  Result Date: 05/15/2019 CLINICAL DATA:  69 year old male with altered mental status. EXAM: CT HEAD WITHOUT CONTRAST TECHNIQUE:  Contiguous axial images were obtained from the base of the skull through the vertex without intravenous contrast. COMPARISON:  01/25/2011 and prior CTs FINDINGS: Brain: No evidence of acute infarction, hemorrhage, hydrocephalus, extra-axial collection or mass lesion/mass effect. Atrophy and chronic small-vessel Norm Wray  matter ischemic changes again noted. Vascular: No hyperdense vessel or unexpected calcification. Skull: Normal. Negative for fracture or focal lesion. Sinuses/Orbits: No acute finding. Other: None. IMPRESSION: No evidence of acute intracranial abnormality. Atrophy and chronic small-vessel Sheala Dosh matter ischemic changes. Electronically Signed   By: Margarette Canada M.D.   On: 05/15/2019 16:20   CT Abdomen Pelvis W Contrast  Result Date: 05/15/2019 CLINICAL DATA:  Altered mental status, unclear cause, nonlocalized EXAM: CT ABDOMEN AND PELVIS WITH CONTRAST TECHNIQUE: Multidetector CT imaging of the abdomen and pelvis was performed using the standard protocol following bolus administration of intravenous contrast. CONTRAST:  134mL OMNIPAQUE IOHEXOL 300 MG/ML  SOLN COMPARISON:  CT 02/16/2016 FINDINGS: Lower chest: Trace bilateral effusions, right slightly greater than left with adjacent atelectatic changes. Normal heart size. No pericardial effusion. Hepatobiliary: Heterogeneity of the hepatic attenuation with a nodular liver surface contour compatible with cirrhosis. There is an ill-defined region some faint peripheral contrast enhancement and centrally isoattenuating tissue in segment III of the liver measuring approximately 3.2 cm (coronal 6/43). This finding likely corresponds to the isoechoic lesion seen on recent abdominal ultrasound. There is a more centrally hypoattenuating focus in the posterior right lobe liver measuring 1.5 cm in size which is not clearly present on comparison studies. Minimal gallbladder wall thickening without focal pericholecystic inflammation. Finding is nonspecific in the  setting of intrinsic liver disease. No visible calcified gallstones or biliary ductal dilatation. Pancreas: Diffuse mild pancreatic atrophy. No focal peripancreatic inflammation or ductal dilatation. Spleen: Borderline splenomegaly. Few punctate calcifications in the spleen likely reflect sequela of prior granulomatous disease. Adrenals/Urinary Tract: Normal adrenal glands. Kidneys enhance and excrete symmetrically. No concerning renal mass, urolith or hydronephrosis. Urinary bladder is unremarkable. Stomach/Bowel: Distal esophagus, stomach and duodenum are unremarkable. A small portion bowels is seen protruding into a small umbilical hernia anteriorly with loculated free fluid within the hernia sac, hernia defect measures approximately 2 x 1.6 cm in transverse by craniocaudal dimension. No evidence of high-grade upstream obstruction. Some mild thickening of the bowel is nonspecific but could suggest some early vascular compromise. More distal small bowel is unremarkable. There is some mild edematous mural thickening predominately of the proximal colon to the level of the hepatic flexure. Nonspecific given additional features liver disease. Vascular/Lymphatic: Atherosclerotic plaque within the normal caliber aorta. No suspicious or enlarged lymph nodes in the included lymphatic chains. Reproductive: Coarse eccentric calcification of the prostate. No concerning abnormalities of the prostate or seminal vesicles. Other: Small to moderate volume ascites predominantly within the subphrenic spaces. Bowel containing ventral hernia arising 3.1 cm superior to the umbilicus. Better detailed in the bowel section above. No other bowel containing hernia is seen. Mild circumferential body wall edema. No abdominopelvic free air. Musculoskeletal: Multilevel degenerative changes are present in the imaged portions of the spine. Prior fusion across the SI joints. Bilateral L5 pars defects are noted with grade 1 anterolisthesis L5 on  S1. Multilevel degenerative changes are present in the imaged portions of the spine. Degenerative changes most pronounced at the L5-S1 level with vacuum disc phenomenon. Remote posttraumatic changes of the bilateral pubic rami and pubic bodies. IMPRESSION: 1. Cirrhosis with a 3.2 cm ill-defined region in segment III of the liver likely corresponds to the isoechoic lesion seen on recent abdominal ultrasound. Such finding is highly worrisome for potential malignancy in the setting of intrinsic liver disease. 2. Centrally hypoattenuating focus in the posterior right lobe liver measuring 1.5 cm in size which is not clearly present on comparison studies. While  this could reflect benign hepatic cysts, a more insidious lesion such as Mack could have a similar appearance. 3. Small to moderate volume ascites predominantly within the subphrenic spaces. Mild circumferential body wall edema. 4. Small bowel containing supraumbilical ventral hernia without resulting obstruction but some mild thickening and hyperemia of the bowel is nonspecific but could suggest some early vascular compromise. Correlate with exam findings and potential reduceability. 5. Features of anasarca with bilateral effusions, ascites, and body wall edema. 6. Minimal gallbladder wall thickening without focal pericholecystic inflammation. Finding is nonspecific in the setting of intrinsic liver disease. 7. Bilateral L5 pars defects with grade 1 anterolisthesis L5 on S1. 8. Aortic Atherosclerosis (ICD10-I70.0). These results were called by telephone at the time of interpretation on 05/15/2019 at 8:33 pm to provider [facility] , who verbally acknowledged these results. Electronically Signed   By: Lovena Le M.D.   On: 05/15/2019 20:33   DG Chest Port 1 View  Result Date: 05/15/2019 CLINICAL DATA:  Confusion, failure to thrive EXAM: PORTABLE CHEST 1 VIEW COMPARISON:  Radiograph 03/26/2019 FINDINGS: Low volumes with streaky opacities in the lung bases  favoring atelectasis. No focal consolidative opacity, pneumothorax or effusion. Mild cardiomegaly is similar to comparison portable radiographs. No acute osseous or soft tissue abnormality. Degenerative changes are present in the imaged spine and shoulders. Telemetry leads overlie the chest. IMPRESSION: 1. Low volumes with streaky opacities in the lung bases favoring atelectasis. 2. Mild cardiomegaly without edema or effusion. Electronically Signed   By: Lovena Le M.D.   On: 05/15/2019 17:41     A/P: [name] is an 69 y.o. male with advanced cirrhosis, HTN, DM here with incarcerated small bowel containing supraumbilical hernia. He also has evident liver lesions seen on imaging.  -WBC 7, afebrile with normal vital signs. INR 3.5; plt 78; tbili 4.4, albumin 1.4, MELD at least 29, Child's C -He has been admitted to the medicine service; I have reviewed his imaging and discussed the case with the covering APP from medicine service. He is currently coagulopathic from his advanced cirrhosis to the point that any exploratory laparotomy/operative intervention at his current state would carry prohibitive risk of exsanguination. -Recommend FFP/Cryo and rechecking INR following -Even once corrected, very high morbidity and mortality associated with surgery in this patient population - would be exlap, possible bowel resection, likely primary layered closure of hernia. Following this, increased risk of ascitic leak, anastomotic leak, further decompensation, renal failure and dialysis, evisceration, hemorrhage, death -I have discussed the above with the patient whom reported agreement with the plan -CIWA - high risk for withdrawal  I have tried multiple times to reach family for more information and to update on his care/plan moving forward - nobody is answering  Sharon Mt. Dema Severin, M.D. [facility] Use [email or web address] to contact on call provider

## 2019-05-15 NOTE — ED Provider Notes (Signed)
[facility] EMERGENCY DEPARTMENT Provider Note   CSN: [id number] Arrival date & time: 05/15/19  1426     History Chief Complaint  Patient presents with  . Failure To Thrive    [name] is a 69 y.o. male with a past medical history of hypertension, DM 2, prolonged QT, alcohol abuse, cirrhosis, who presents today for evaluation of altered mental status.  He is unable to provide history due to his confused state.  Level 5 caveat  According to triage notes family reports that over the past few days he has been more confused, he is not ambulatory which is new and different for him.  HPI     Past Medical History:  Diagnosis Date  . Abdominal hernia   . Arthritis    "hands" (02/16/2016)  . Bright red rectal bleeding    "@ least 1-2 times/month; sometimes more" (02/16/2016)  . Cirrhosis ([place])   . Hypertension   . Type II diabetes mellitus [facility])     Patient Active Problem List   Diagnosis Date Noted  . AMS (altered mental status) 05/15/2019  . SIRS (systemic inflammatory response syndrome) ([place]) 08/14/2016  . Prolonged QT interval 08/14/2016  . Anxiety 03/06/2016  . Insomnia 03/06/2016  . Hypertension 03/06/2016  . Hepatitis C antibody test positive 02/18/2016  . Thrombocytopenia ([place]) 02/16/2016  . Normochromic normocytic anemia 02/16/2016  . Alcohol abuse 02/16/2016  . Cirrhosis of liver ([place]) 02/16/2016    Past Surgical History:  Procedure Laterality Date  . COLONOSCOPY N/A 02/17/2016   Procedure: COLONOSCOPY;  Surgeon: [name], MD;  Location: [facility] ENDOSCOPY;  Service: Endoscopy;  Laterality: N/A;  . ESOPHAGOGASTRODUODENOSCOPY N/A 02/17/2016   Procedure: ESOPHAGOGASTRODUODENOSCOPY (EGD);  Surgeon: [name], MD;  Location: [facility] ENDOSCOPY;  Service: Endoscopy;  Laterality: N/A;  . FRACTURE SURGERY    . IR PARACENTESIS  08/14/2016  . [place]   "has steel rods in it"       Family History  Problem Relation Age of Onset   . Hypertension Other     Social History   Tobacco Use  . Smoking status: Former Smoker    Packs/day: 0.50    Years: 38.00    Pack years: 19.00    Types: Cigarettes    Quit date: 2009    Years since quitting: 12.3  . Smokeless tobacco: Never Used  Substance Use Topics  . Alcohol use: No    Comment: Quit in 4/18  . Drug use: No    Home Medications Prior to Admission medications   Medication Sig Start Date End Date Taking? Authorizing Provider  cephALEXin (KEFLEX) 500 MG capsule Take 1 capsule (500 mg total) by mouth 4 (four) times daily. Patient not taking: Reported on 03/26/2019 04/01/18   Mesner, Corene Cornea, MD  furosemide (LASIX) 40 MG tablet TAKE 1 TABLET BY MOUTH EVERY DAY Patient taking differently: Take 40 mg by mouth at bedtime.  02/08/18   Milus Banister, MD  gabapentin (NEURONTIN) 100 MG capsule Take 1 capsule (100 mg total) by mouth 3 (three) times daily. 04/17/18   Davonna Belling, MD  oxyCODONE-acetaminophen (PERCOCET/ROXICET) 5-325 MG tablet Take 1 tablet by mouth every 8 (eight) hours as needed for severe pain. Patient not taking: Reported on 03/26/2019 04/17/18   Davonna Belling, MD  spironolactone (ALDACTONE) 100 MG tablet TAKE 1 TABLET BY MOUTH EVERY DAY Patient not taking: K+ sparing diuretic: Hyperkalemia may occur with decreased renal function 01/04/18   Milus Banister, MD  Allergies    Patient has no known allergies.  Review of Systems   Review of Systems  Unable to perform ROS: Mental status change    Physical Exam Updated Vital Signs BP 132/72   Pulse 66   Temp 99.9 F (37.7 C) (Rectal)   Resp (!) 21   Ht 5\' 11"  (1.803 m)   SpO2 97%   BMI 27.89 kg/m   Physical Exam Vitals and nursing note reviewed.  Constitutional:      General: He is not in acute distress.    Appearance: He is well-developed. He is not diaphoretic.  HENT:     Head: Normocephalic and atraumatic.  Eyes:     General: No scleral icterus.       Right eye: No discharge.         Left eye: No discharge.     Conjunctiva/sclera: Conjunctivae normal.  Cardiovascular:     Rate and Rhythm: Normal rate and regular rhythm.  Pulmonary:     Effort: Pulmonary effort is normal. No respiratory distress.     Breath sounds: No stridor.  Abdominal:     General: There is no distension.     Tenderness: There is abdominal tenderness.     Hernia: A hernia (Midline, TTP) is present.  Musculoskeletal:        General: No deformity.     Cervical back: Normal range of motion.  Skin:    General: Skin is warm and dry.  Neurological:     GCS: GCS eye subscore is 3. GCS verbal subscore is 4. GCS motor subscore is 6.     Motor: No abnormal muscle tone.     Comments: Pupils are equal round reactive to light. Facial movements are symmetric. He is able to turn himself in bed with difficulty.  Psychiatric:     Comments: Cooperative, however unable to further assess based on mental status.     ED Results / Procedures / Treatments   Labs (all labs ordered are listed, but only abnormal results are displayed) Labs Reviewed  COMPREHENSIVE METABOLIC PANEL - Abnormal; Notable for the following components:      Result Value   Sodium 133 (*)    Glucose, Bld 100 (*)    Calcium 7.8 (*)    Total Protein 6.0 (*)    Albumin 1.4 (*)    AST 68 (*)    Total Bilirubin 4.4 (*)    All other components within normal limits  PROTIME-INR - Abnormal; Notable for the following components:   Prothrombin Time 34.7 (*)    INR 3.5 (*)    All other components within normal limits  CBC WITH DIFFERENTIAL/PLATELET - Abnormal; Notable for the following components:   RBC 3.49 (*)    Hemoglobin 8.8 (*)    HCT 30.6 (*)    MCH 25.2 (*)    MCHC 28.8 (*)    RDW 25.6 (*)    Platelets 78 (*)    Lymphs Abs 0.5 (*)    All other components within normal limits  VITAMIN B12 - Abnormal; Notable for the following components:   Vitamin B-12 2,038 (*)    All other components within normal limits  IRON AND TIBC  - Abnormal; Notable for the following components:   Iron 16 (*)    TIBC 249 (*)    Saturation Ratios 6 (*)    All other components within normal limits  RETICULOCYTES - Abnormal; Notable for the following components:   Retic Ct Pct 4.3 (*)  RBC. 3.21 (*)    Immature Retic Fract 35.9 (*)    All other components within normal limits  HEMOGLOBIN AND HEMATOCRIT, BLOOD - Abnormal; Notable for the following components:   Hemoglobin 9.5 (*)    HCT 32.5 (*)    All other components within normal limits  ACETAMINOPHEN LEVEL - Abnormal; Notable for the following components:   Acetaminophen (Tylenol), Serum <10 (*)    All other components within normal limits  SALICYLATE LEVEL - Abnormal; Notable for the following components:   Salicylate Lvl [id number] (*)    All other components within normal limits  LACTIC ACID, PLASMA - Abnormal; Notable for the following components:   Lactic Acid, Venous 2.7 (*)    All other components within normal limits  RESPIRATORY PANEL BY RT PCR (FLU A&B, COVID)  LIPASE, BLOOD  AMMONIA  ETHANOL  MAGNESIUM  FOLATE  FERRITIN  URINALYSIS, ROUTINE W REFLEX MICROSCOPIC  RAPID URINE DRUG SCREEN, HOSP PERFORMED  VITAMIN B1  LACTIC ACID, PLASMA  HIV ANTIBODY (ROUTINE TESTING W REFLEX)  CBC  COMPREHENSIVE METABOLIC PANEL  POC OCCULT BLOOD, ED  TYPE AND SCREEN  ABO/RH    EKG EKG Interpretation  Date/Time:  Thursday May 15 2019 15:11:34 EDT Ventricular Rate:  77 PR Interval:    QRS Duration: 102 QT Interval:  434 QTC Calculation: 492 R Axis:   53 Text Interpretation: Sinus rhythm Borderline prolonged QT interval No STEMI Confirmed by Octaviano Glow [phone]) on 05/15/2019 3:44:57 PM   Radiology CT Head Wo Contrast  Result Date: 05/15/2019 CLINICAL DATA:  69 year old male with altered mental status. EXAM: CT HEAD WITHOUT CONTRAST TECHNIQUE: Contiguous axial images were obtained from the base of the skull through the vertex without intravenous contrast.  COMPARISON:  01/25/2011 and prior CTs FINDINGS: Brain: No evidence of acute infarction, hemorrhage, hydrocephalus, extra-axial collection or mass lesion/mass effect. Atrophy and chronic small-vessel white matter ischemic changes again noted. Vascular: No hyperdense vessel or unexpected calcification. Skull: Normal. Negative for fracture or focal lesion. Sinuses/Orbits: No acute finding. Other: None. IMPRESSION: No evidence of acute intracranial abnormality. Atrophy and chronic small-vessel white matter ischemic changes. Electronically Signed   By: Margarette Canada M.D.   On: 05/15/2019 16:20   CT Abdomen Pelvis W Contrast  Result Date: 05/15/2019 CLINICAL DATA:  Altered mental status, unclear cause, nonlocalized EXAM: CT ABDOMEN AND PELVIS WITH CONTRAST TECHNIQUE: Multidetector CT imaging of the abdomen and pelvis was performed using the standard protocol following bolus administration of intravenous contrast. CONTRAST:  11mL OMNIPAQUE IOHEXOL 300 MG/ML  SOLN COMPARISON:  CT 02/16/2016 FINDINGS: Lower chest: Trace bilateral effusions, right slightly greater than left with adjacent atelectatic changes. Normal heart size. No pericardial effusion. Hepatobiliary: Heterogeneity of the hepatic attenuation with a nodular liver surface contour compatible with cirrhosis. There is an ill-defined region some faint peripheral contrast enhancement and centrally isoattenuating tissue in segment III of the liver measuring approximately 3.2 cm (coronal 6/43). This finding likely corresponds to the isoechoic lesion seen on recent abdominal ultrasound. There is a more centrally hypoattenuating focus in the posterior right lobe liver measuring 1.5 cm in size which is not clearly present on comparison studies. Minimal gallbladder wall thickening without focal pericholecystic inflammation. Finding is nonspecific in the setting of intrinsic liver disease. No visible calcified gallstones or biliary ductal dilatation. Pancreas: Diffuse  mild pancreatic atrophy. No focal peripancreatic inflammation or ductal dilatation. Spleen: Borderline splenomegaly. Few punctate calcifications in the spleen likely reflect sequela of prior granulomatous disease. Adrenals/Urinary Tract: Normal adrenal  glands. Kidneys enhance and excrete symmetrically. No concerning renal mass, urolith or hydronephrosis. Urinary bladder is unremarkable. Stomach/Bowel: Distal esophagus, stomach and duodenum are unremarkable. A small portion bowels is seen protruding into a small umbilical hernia anteriorly with loculated free fluid within the hernia sac, hernia defect measures approximately 2 x 1.6 cm in transverse by craniocaudal dimension. No evidence of high-grade upstream obstruction. Some mild thickening of the bowel is nonspecific but could suggest some early vascular compromise. More distal small bowel is unremarkable. There is some mild edematous mural thickening predominately of the proximal colon to the level of the hepatic flexure. Nonspecific given additional features liver disease. Vascular/Lymphatic: Atherosclerotic plaque within the normal caliber aorta. No suspicious or enlarged lymph nodes in the included lymphatic chains. Reproductive: Coarse eccentric calcification of the prostate. No concerning abnormalities of the prostate or seminal vesicles. Other: Small to moderate volume ascites predominantly within the subphrenic spaces. Bowel containing ventral hernia arising 3.1 cm superior to the umbilicus. Better detailed in the bowel section above. No other bowel containing hernia is seen. Mild circumferential body wall edema. No abdominopelvic free air. Musculoskeletal: Multilevel degenerative changes are present in the imaged portions of the spine. Prior fusion across the SI joints. Bilateral L5 pars defects are noted with grade 1 anterolisthesis L5 on S1. Multilevel degenerative changes are present in the imaged portions of the spine. Degenerative changes most  pronounced at the L5-S1 level with vacuum disc phenomenon. Remote posttraumatic changes of the bilateral pubic rami and pubic bodies. IMPRESSION: 1. Cirrhosis with a 3.2 cm ill-defined region in segment III of the liver likely corresponds to the isoechoic lesion seen on recent abdominal ultrasound. Such finding is highly worrisome for potential malignancy in the setting of intrinsic liver disease. 2. Centrally hypoattenuating focus in the posterior right lobe liver measuring 1.5 cm in size which is not clearly present on comparison studies. While this could reflect benign hepatic cysts, a more insidious lesion such as [place] could have a similar appearance. 3. Small to moderate volume ascites predominantly within the subphrenic spaces. Mild circumferential body wall edema. 4. Small bowel containing supraumbilical ventral hernia without resulting obstruction but some mild thickening and hyperemia of the bowel is nonspecific but could suggest some early vascular compromise. Correlate with exam findings and potential reduceability. 5. Features of anasarca with bilateral effusions, ascites, and body wall edema. 6. Minimal gallbladder wall thickening without focal pericholecystic inflammation. Finding is nonspecific in the setting of intrinsic liver disease. 7. Bilateral L5 pars defects with grade 1 anterolisthesis L5 on S1. 8. Aortic Atherosclerosis (ICD10-I70.0). These results were called by telephone at the time of interpretation on 05/15/2019 at 8:33 pm to provider [facility] , who verbally acknowledged these results. Electronically Signed   By: Lovena Le M.D.   On: 05/15/2019 20:33   DG Chest Port 1 View  Result Date: 05/15/2019 CLINICAL DATA:  Confusion, failure to thrive EXAM: PORTABLE CHEST 1 VIEW COMPARISON:  Radiograph 03/26/2019 FINDINGS: Low volumes with streaky opacities in the lung bases favoring atelectasis. No focal consolidative opacity, pneumothorax or effusion. Mild cardiomegaly is similar to  comparison portable radiographs. No acute osseous or soft tissue abnormality. Degenerative changes are present in the imaged spine and shoulders. Telemetry leads overlie the chest. IMPRESSION: 1. Low volumes with streaky opacities in the lung bases favoring atelectasis. 2. Mild cardiomegaly without edema or effusion. Electronically Signed   By: Lovena Le M.D.   On: 05/15/2019 17:41    Procedures Procedures (including critical care time)  Medications Ordered in ED Medications  LORazepam (ATIVAN) injection 0-4 mg ( Intravenous See Alternative 05/15/19 1943)    Or  LORazepam (ATIVAN) tablet 0-4 mg (2 mg Oral Given 05/15/19 1943)  LORazepam (ATIVAN) injection 0-4 mg (has no administration in time range)    Or  LORazepam (ATIVAN) tablet 0-4 mg (has no administration in time range)  thiamine tablet 100 mg (100 mg Oral Given 05/15/19 1943)    Or  thiamine (B-1) injection 100 mg ( Intravenous See Alternative 05/15/19 1943)  cefTRIAXone (ROCEPHIN) 1 g in sodium chloride 0.9 % 100 mL IVPB (has no administration in time range)  sodium chloride 0.9 % bolus 500 mL (0 mLs Intravenous Stopped 05/15/19 1953)  iohexol (OMNIPAQUE) 300 MG/ML solution 100 mL (100 mLs Intravenous Contrast Given 05/15/19 1957)    ED Course  I have reviewed the triage vital signs and the nursing notes.  Pertinent labs & imaging results that were available during my care of the patient were reviewed by me and considered in my medical decision making (see chart for details).  Clinical Course as of May 14 2117  Thu May 15, 2019  1707 I attempted to contact family.  No answer.    [EH]  2029 CT Abdomen Pelvis W Contrast [EH]    Clinical Course User Index [EH] Ollen Gross   MDM Rules/Calculators/A&P                     Patient is a 69 year old man who presents today for evaluation of confusion.  Unable to get in contact with family and patient is unable to provide reliable information therefore information about  recent events is extremely limited.  There he is afebrile, not consistently tachycardic or tachypneic.  He does not have a leukocytosis.  His CBC shows anemia, anemia panel was sent.  Occult blood in stool is negative.  CMP without significant acute abnormalities.  His total bilirubin is elevated however it is better than previous.  Ethanol is negative.  Lipase is not elevated.  Covid testing is negative.  Given that ethanol is negative he is started on CIWA protocol.  I do not suspect he is acutely withdrawing as he does not appear hallucinating, is not tremulous does not appear anxious.  His ammonia is not significantly elevated.  INR is elevated at 3.5, does not appear that he takes any blood thinners.  Chart review shows that he has longstanding history of alcohol use and cirrhosis.  CT abd was obtained due to history of alcohol abuse with unknown recent course.  Results were viewed and discussed with hospitalist.   Hospitalist Dr. Flossie Buffy will see patient for admission.   This patient was seen as a shared visit with Dr. Eulis Foster.   Note: Portions of this report may have been transcribed using voice recognition software. Every effort was made to ensure accuracy; however, inadvertent computerized transcription errors may be present   Final Clinical Impression(s) / ED Diagnoses Final diagnoses:  Confusion  Hernia of small intestine    Rx / DC Orders ED Discharge Orders    None       Ollen Gross 05/15/19 2126    Wyvonnia Dusky, MD 05/16/19 [phone]

## 2019-05-15 NOTE — H&P (Addendum)
History and Physical    [name] [id number] DOB: 05-Jun-1950 DOA: 05/15/2019  PCP: Charlott Rakes, MD  Patient coming from: Home  I have personally briefly reviewed patient's old medical records in [place]  Chief Complaint: Altered mental status, decreased mobility  HPI: [name] is a 69 y.o. male with medical history significant for type 2 diabetes, hypertension, alcoholic cirrhosis, history of prolonged QT who presents with concerns of altered mental status and decreased mobility per family.  Patient was a poor historian and appeared confused.  No family could be reached.  He only mentions that he has been having 2 weeks of lower extremity and abdominal swelling.  Also endorses abdominal pain but was unable to localize it.  He was alert and oriented only to self.  Noted to have a history of medication noncompliance.   ED Course: He had a temperature of 99.9, normotensive on room air. WBC of 7, hemoglobin 8.8 from a prior of 11.5 although suspect his baseline is lower since he had a prior of 9 a month ago.  Platelet of 78 which appears to be chronic.  Sodium of 133, glucose of 100, AST of 68 and ALT of 43 which is stable.  Total bilirubin of 4.4 down from 6 about a week ago.  INR 3.5. Creatinine normal at 1.11.  CT head negative.  Chest x-ray negative.  CT abdomen and pelvis showed cirrhosis with a 3.2 cm ill-defined region in the liver that is worrisome for potential malignancy.  There is also another 1.5 cm lesion to the right lobe of the liver that could be malignancy as well.  For small to moderate volume ascites predominantly in the subphrenic space.  Features of anasarca with bilateral effusion, ascites and body wall edema.  Review of Systems:  Unable to obtain given AMS  Past Medical History:  Diagnosis Date  . Abdominal hernia   . Arthritis    "hands" (02/16/2016)  . Bright red rectal bleeding    "@ least 1-2 times/month; sometimes more" (02/16/2016)  .  Cirrhosis ([place])   . Hypertension   . Type II diabetes mellitus ([place])     Past Surgical History:  Procedure Laterality Date  . COLONOSCOPY N/A 02/17/2016   Procedure: COLONOSCOPY;  Surgeon: [name], MD;  Location: [facility] ENDOSCOPY;  Service: Endoscopy;  Laterality: N/A;  . ESOPHAGOGASTRODUODENOSCOPY N/A 02/17/2016   Procedure: ESOPHAGOGASTRODUODENOSCOPY (EGD);  Surgeon: [name], MD;  Location: [facility] ENDOSCOPY;  Service: Endoscopy;  Laterality: N/A;  . FRACTURE SURGERY    . IR PARACENTESIS  08/14/2016  . [place]   "has steel rods in it"     reports that he quit smoking about 12 years ago. His smoking use included cigarettes. He has a 19.00 pack-year smoking history. He has never used smokeless tobacco. He reports that he does not drink alcohol or use drugs.  No Known Allergies  Family History  Problem Relation Age of Onset  . Hypertension Other      Prior to Admission medications   Medication Sig Start Date End Date Taking? Authorizing Provider  cephALEXin (KEFLEX) 500 MG capsule Take 1 capsule (500 mg total) by mouth 4 (four) times daily. Patient not taking: Reported on 03/26/2019 04/01/18   Mesner, Corene Cornea, MD  furosemide (LASIX) 40 MG tablet TAKE 1 TABLET BY MOUTH EVERY DAY Patient taking differently: Take 40 mg by mouth at bedtime.  02/08/18   Milus Banister, MD  gabapentin (NEURONTIN) 100 MG capsule Take 1  capsule (100 mg total) by mouth 3 (three) times daily. 04/17/18   Davonna Belling, MD  oxyCODONE-acetaminophen (PERCOCET/ROXICET) 5-325 MG tablet Take 1 tablet by mouth every 8 (eight) hours as needed for severe pain. Patient not taking: Reported on 03/26/2019 04/17/18   Davonna Belling, MD  spironolactone (ALDACTONE) 100 MG tablet TAKE 1 TABLET BY MOUTH EVERY DAY Patient not taking: K+ sparing diuretic: Hyperkalemia may occur with decreased renal function 01/04/18   Milus Banister, MD    Physical Exam: Vitals:   05/15/19 1900 05/15/19 1915 05/15/19  1915 05/15/19 1930  BP:   135/72 132/72  Pulse: 73 72 72 66  Resp: 19 (!) 24  (!) 21  Temp:      TempSrc:      SpO2: 97% 100%  97%  Height:        Constitutional: NAD, calm, comfortable, elderly male laying flat in bed Vitals:   05/15/19 1900 05/15/19 1915 05/15/19 1915 05/15/19 1930  BP:   135/72 132/72  Pulse: 73 72 72 66  Resp: 19 (!) 24  (!) 21  Temp:      TempSrc:      SpO2: 97% 100%  97%  Height:       Eyes: PERRL, lids and conjunctivae normal ENMT: Mucous membranes are moist.  Neck: normal, supple Respiratory: clear to auscultation bilaterally, no wheezing, no crackles. Normal respiratory effort. No accessory muscle use.  Cardiovascular: Regular rate and rhythm, no murmurs / rubs / gallops. +2 pitting edema of pre-tibial region of bilateral LE. 2+ pedal pulses.  Abdomen:  Mild Tenderness to umbilical and left quadrants but no rebound, guarding or rigidity.  Reducible ventral hernia noted. Bowel sounds positive.  Musculoskeletal: no clubbing / cyanosis. No joint deformity upper and lower extremities.  Skin: Chronic venous stasis changes of bilateral LE Neurologic: Patient alert and oriented only to self.  Able to move all extremities.  Able to answer some simple questions but otherwise appears confused.  Patient was pulling off close during exam. Psychiatric: Appears confused.  Alert and oriented only to self.   Labs on Admission: I have personally reviewed following labs and imaging studies  CBC: Recent Labs  Lab 05/15/19 1512 05/15/19 1911  WBC 7.0  --   NEUTROABS 5.7  --   HGB 8.8* 9.5*  HCT 30.6* 32.5*  MCV 87.7  --   PLT 78*  --    Basic Metabolic Panel: Recent Labs  Lab 05/15/19 1512  NA 133*  K 4.5  CL 101  CO2 22  GLUCOSE 100*  BUN 14  CREATININE 1.11  CALCIUM 7.8*  MG 1.8   GFR: CrCl cannot be calculated (Unknown ideal weight.). Liver Function Tests: Recent Labs  Lab 05/15/19 1512  AST 68*  ALT 43  ALKPHOS 64  BILITOT 4.4*  PROT  6.0*  ALBUMIN 1.4*   Recent Labs  Lab 05/15/19 1512  LIPASE 47   Recent Labs  Lab 05/15/19 1512  AMMONIA 20   Coagulation Profile: Recent Labs  Lab 05/15/19 1512  INR 3.5*   Cardiac Enzymes: No results for input(s): CKTOTAL, CKMB, CKMBINDEX, TROPONINI in the last 168 hours. BNP (last 3 results) No results for input(s): PROBNP in the last 8760 hours. HbA1C: No results for input(s): HGBA1C in the last 72 hours. CBG: No results for input(s): GLUCAP in the last 168 hours. Lipid Profile: No results for input(s): CHOL, HDL, LDLCALC, TRIG, CHOLHDL, LDLDIRECT in the last 72 hours. Thyroid Function Tests: No results for input(s):  TSH, T4TOTAL, FREET4, T3FREE, THYROIDAB in the last 72 hours. Anemia Panel: Recent Labs    05/15/19 1911  VITAMINB12 2,038*  FOLATE 8.1  FERRITIN 80  TIBC 249*  IRON 16*  RETICCTPCT 4.3*   Urine analysis:    Component Value Date/Time   COLORURINE YELLOW 01/19/2017 0137   APPEARANCEUR CLEAR 01/19/2017 0137   LABSPEC 1.006 01/19/2017 0137   PHURINE 5.0 01/19/2017 0137   GLUCOSEU NEGATIVE 01/19/2017 0137   HGBUR NEGATIVE 01/19/2017 0137   BILIRUBINUR NEGATIVE 01/19/2017 0137   KETONESUR NEGATIVE 01/19/2017 0137   PROTEINUR NEGATIVE 01/19/2017 0137   UROBILINOGEN 1.0 12/07/2014 1042   NITRITE NEGATIVE 01/19/2017 0137   LEUKOCYTESUR NEGATIVE 01/19/2017 0137    Radiological Exams on Admission: CT Head Wo Contrast  Result Date: 05/15/2019 CLINICAL DATA:  69 year old male with altered mental status. EXAM: CT HEAD WITHOUT CONTRAST TECHNIQUE: Contiguous axial images were obtained from the base of the skull through the vertex without intravenous contrast. COMPARISON:  01/25/2011 and prior CTs FINDINGS: Brain: No evidence of acute infarction, hemorrhage, hydrocephalus, extra-axial collection or mass lesion/mass effect. Atrophy and chronic small-vessel white matter ischemic changes again noted. Vascular: No hyperdense vessel or unexpected  calcification. Skull: Normal. Negative for fracture or focal lesion. Sinuses/Orbits: No acute finding. Other: None. IMPRESSION: No evidence of acute intracranial abnormality. Atrophy and chronic small-vessel white matter ischemic changes. Electronically Signed   By: Margarette Canada M.D.   On: 05/15/2019 16:20   CT Abdomen Pelvis W Contrast  Result Date: 05/15/2019 CLINICAL DATA:  Altered mental status, unclear cause, nonlocalized EXAM: CT ABDOMEN AND PELVIS WITH CONTRAST TECHNIQUE: Multidetector CT imaging of the abdomen and pelvis was performed using the standard protocol following bolus administration of intravenous contrast. CONTRAST:  131mL OMNIPAQUE IOHEXOL 300 MG/ML  SOLN COMPARISON:  CT 02/16/2016 FINDINGS: Lower chest: Trace bilateral effusions, right slightly greater than left with adjacent atelectatic changes. Normal heart size. No pericardial effusion. Hepatobiliary: Heterogeneity of the hepatic attenuation with a nodular liver surface contour compatible with cirrhosis. There is an ill-defined region some faint peripheral contrast enhancement and centrally isoattenuating tissue in segment III of the liver measuring approximately 3.2 cm (coronal 6/43). This finding likely corresponds to the isoechoic lesion seen on recent abdominal ultrasound. There is a more centrally hypoattenuating focus in the posterior right lobe liver measuring 1.5 cm in size which is not clearly present on comparison studies. Minimal gallbladder wall thickening without focal pericholecystic inflammation. Finding is nonspecific in the setting of intrinsic liver disease. No visible calcified gallstones or biliary ductal dilatation. Pancreas: Diffuse mild pancreatic atrophy. No focal peripancreatic inflammation or ductal dilatation. Spleen: Borderline splenomegaly. Few punctate calcifications in the spleen likely reflect sequela of prior granulomatous disease. Adrenals/Urinary Tract: Normal adrenal glands. Kidneys enhance and excrete  symmetrically. No concerning renal mass, urolith or hydronephrosis. Urinary bladder is unremarkable. Stomach/Bowel: Distal esophagus, stomach and duodenum are unremarkable. A small portion bowels is seen protruding into a small umbilical hernia anteriorly with loculated free fluid within the hernia sac, hernia defect measures approximately 2 x 1.6 cm in transverse by craniocaudal dimension. No evidence of high-grade upstream obstruction. Some mild thickening of the bowel is nonspecific but could suggest some early vascular compromise. More distal small bowel is unremarkable. There is some mild edematous mural thickening predominately of the proximal colon to the level of the hepatic flexure. Nonspecific given additional features liver disease. Vascular/Lymphatic: Atherosclerotic plaque within the normal caliber aorta. No suspicious or enlarged lymph nodes in the included lymphatic chains. Reproductive:  Coarse eccentric calcification of the prostate. No concerning abnormalities of the prostate or seminal vesicles. Other: Small to moderate volume ascites predominantly within the subphrenic spaces. Bowel containing ventral hernia arising 3.1 cm superior to the umbilicus. Better detailed in the bowel section above. No other bowel containing hernia is seen. Mild circumferential body wall edema. No abdominopelvic free air. Musculoskeletal: Multilevel degenerative changes are present in the imaged portions of the spine. Prior fusion across the SI joints. Bilateral L5 pars defects are noted with grade 1 anterolisthesis L5 on S1. Multilevel degenerative changes are present in the imaged portions of the spine. Degenerative changes most pronounced at the L5-S1 level with vacuum disc phenomenon. Remote posttraumatic changes of the bilateral pubic rami and pubic bodies. IMPRESSION: 1. Cirrhosis with a 3.2 cm ill-defined region in segment III of the liver likely corresponds to the isoechoic lesion seen on recent abdominal  ultrasound. Such finding is highly worrisome for potential malignancy in the setting of intrinsic liver disease. 2. Centrally hypoattenuating focus in the posterior right lobe liver measuring 1.5 cm in size which is not clearly present on comparison studies. While this could reflect benign hepatic cysts, a more insidious lesion such as [place] could have a similar appearance. 3. Small to moderate volume ascites predominantly within the subphrenic spaces. Mild circumferential body wall edema. 4. Small bowel containing supraumbilical ventral hernia without resulting obstruction but some mild thickening and hyperemia of the bowel is nonspecific but could suggest some early vascular compromise. Correlate with exam findings and potential reduceability. 5. Features of anasarca with bilateral effusions, ascites, and body wall edema. 6. Minimal gallbladder wall thickening without focal pericholecystic inflammation. Finding is nonspecific in the setting of intrinsic liver disease. 7. Bilateral L5 pars defects with grade 1 anterolisthesis L5 on S1. 8. Aortic Atherosclerosis (ICD10-I70.0). These results were called by telephone at the time of interpretation on 05/15/2019 at 8:33 pm to provider [facility] , who verbally acknowledged these results. Electronically Signed   By: Lovena Le M.D.   On: 05/15/2019 20:33   DG Chest Port 1 View  Result Date: 05/15/2019 CLINICAL DATA:  Confusion, failure to thrive EXAM: PORTABLE CHEST 1 VIEW COMPARISON:  Radiograph 03/26/2019 FINDINGS: Low volumes with streaky opacities in the lung bases favoring atelectasis. No focal consolidative opacity, pneumothorax or effusion. Mild cardiomegaly is similar to comparison portable radiographs. No acute osseous or soft tissue abnormality. Degenerative changes are present in the imaged spine and shoulders. Telemetry leads overlie the chest. IMPRESSION: 1. Low volumes with streaky opacities in the lung bases favoring atelectasis. 2. Mild  cardiomegaly without edema or effusion. Electronically Signed   By: Lovena Le M.D.   On: 05/15/2019 17:41    EKG: Independently reviewed.  Assessment/Plan  AMS in the setting of cirrhosis concerning for SBP Normal ammonia, LFTs and total bilirubin stable but AMS with worseing anasarca is concerning for SBP will start Rocephin IR consult for paracentesis for fluid studies   Liver lesions concerning for maligancy.  Recommend GI consult in the morning  Ventral hernia appears reducible. Benign abdominal exam Continue to monitor  Alcohol abuse Unsure of pt's last drink Place on CIWA protocol for precaution  Thrombocyopenia  chronic and stable  Anemia of chronic disease stable  Elevated INR due to cirrhosis   Type 2 diabetes  Monitor without sliding scale for now  HTN stable  DVT prophylaxis: SCDS Code Status: Full Family Communication:Unable to reach family  disposition Plan: Home with at least 2 midnight stays  Consults  called:  Admission status: inpatient  Status is: Inpatient  Remains inpatient appropriate because:Altered mental status   Dispo: The patient is from: Home              Anticipated d/c is to: Home              Anticipated d/c date is: 2 days              Patient currently is not medically stable to d/c.          Orene Desanctis DO [facility]   If 7PM-7AM, please contact night-coverage [email or web address]   05/15/2019, 9:20 PM

## 2019-05-15 NOTE — ED Notes (Signed)
Pt walked out the door undressed, trying to leave. Pt was having trouble walking and could not maintain balance. This tech and Visual merchandiser got the pt back in bed and dressed. Pt was reeducated on use of the call light for any assistance. Pt is in bed.

## 2019-05-15 NOTE — ED Notes (Signed)
[name] step daugher [id number] looking for an update on the pt

## 2019-05-15 NOTE — ED Notes (Signed)
Pt transported to CT ?

## 2019-05-15 NOTE — ED Notes (Signed)
PA performed rectal exam to collect hemoccult card. Pt tolerated well, resting currently.

## 2019-05-15 NOTE — ED Triage Notes (Signed)
Pt BIB [facility] from home w/ c/o decreased mental status, difficulty concentrating, decreased mobility ongoing for the past few days, per family. Pt hx of end stage cirrhosis, edema noted in bilat lower legs. VS stable, A&Ox2, confused to time and situation, NAD noted.  150/100 HR 96 96% on RA 98.3 oral temp 120 CBG

## 2019-05-15 NOTE — ED Notes (Signed)
Pt seen out of bed. Pt removed IV and urinated on self. Pt placed in clean linens and hooked back up to the monitor. Pt redirected back to the bed and hooked up to the male urine collection system.

## 2019-05-15 NOTE — ED Provider Notes (Signed)
  Face-to-face evaluation   History: He presents by EMS for evaluation of altered mental status.  No murmurs reported that he has been going on for few days.  They're not currently here.  He is reported to have cirrhosis.  Patient states he does not drink alcohol.  He is unable to give a cogent history.  Physical exam: Alert, calm cooperative.  He is oriented to person place and the street where he lives.  No dysarthria or aphasia.  Abdomen soft nontender.  Extremities had bilateral pitting edema, left greater than right.  Chronic appearing edema of the right lower legs present.  Tender abdominal mass, just above umbilicus consistent with hernia.  This mass is not reducible.  10:15 PM-again tried to reduce mass, above umbilicus, not reducible.  Patient had moderate pain with attempted reduction which ceased when I stopped the reduction attempt.  Consult general surgery regarding hernia care-Dr. Dema Severin is currently in the operating room.  I left a message with the nurse, who will give him the request for consultation regarding a nonreducible hernia.  Medical screening examination/treatment/procedure(s) were conducted as a shared visit with non-physician practitioner(s) and myself.  I personally evaluated the patient during the encounter    Daleen Bo, MD 05/16/19 [phone]

## 2019-05-16 ENCOUNTER — Inpatient Hospital Stay (HOSPITAL_COMMUNITY): Payer: Medicare Other

## 2019-05-16 DIAGNOSIS — K469 Unspecified abdominal hernia without obstruction or gangrene: Secondary | ICD-10-CM

## 2019-05-16 DIAGNOSIS — K769 Liver disease, unspecified: Secondary | ICD-10-CM

## 2019-05-16 DIAGNOSIS — K7031 Alcoholic cirrhosis of liver with ascites: Secondary | ICD-10-CM

## 2019-05-16 DIAGNOSIS — Z515 Encounter for palliative care: Secondary | ICD-10-CM

## 2019-05-16 DIAGNOSIS — Z7189 Other specified counseling: Secondary | ICD-10-CM

## 2019-05-16 DIAGNOSIS — R410 Disorientation, unspecified: Secondary | ICD-10-CM

## 2019-05-16 LAB — COMPREHENSIVE METABOLIC PANEL
ALT: 42 U/L (ref 0–44)
AST: 66 U/L — ABNORMAL HIGH (ref 15–41)
Albumin: 1.7 g/dL — ABNORMAL LOW (ref 3.5–5.0)
Alkaline Phosphatase: 62 U/L (ref 38–126)
Anion gap: 7 (ref 5–15)
BUN: 11 mg/dL (ref 8–23)
CO2: 26 mmol/L (ref 22–32)
Calcium: 8 mg/dL — ABNORMAL LOW (ref 8.9–10.3)
Chloride: 100 mmol/L (ref 98–111)
Creatinine, Ser: 0.94 mg/dL (ref 0.61–1.24)
GFR calc Af Amer: >60 mL/min (ref >60)
GFR calc non Af Amer: >60 mL/min (ref >60)
Glucose, Bld: 99 mg/dL (ref 70–99)
Potassium: 4.1 mmol/L (ref 3.5–5.1)
Sodium: 133 mmol/L — ABNORMAL LOW (ref 135–145)
Total Bilirubin: 4.1 mg/dL — ABNORMAL HIGH (ref 0.3–1.2)
Total Protein: 6.5 g/dL (ref 6.5–8.1)

## 2019-05-16 LAB — CBC
HCT: 31.4 % — ABNORMAL LOW (ref 39.0–52.0)
Hemoglobin: 9.2 g/dL — ABNORMAL LOW (ref 13.0–17.0)
MCH: 25.4 pg — ABNORMAL LOW (ref 26.0–34.0)
MCHC: 29.3 g/dL — ABNORMAL LOW (ref 30.0–36.0)
MCV: 86.7 fL (ref 80.0–100.0)
Platelets: 79 10*3/uL — ABNORMAL LOW (ref 150–400)
RBC: 3.62 MIL/uL — ABNORMAL LOW (ref 4.22–5.81)
RDW: 25.2 % — ABNORMAL HIGH (ref 11.5–15.5)
WBC: 7 10*3/uL (ref 4.0–10.5)
nRBC: 0 % (ref 0.0–0.2)

## 2019-05-16 LAB — PROTIME-INR
INR: 2.6 — ABNORMAL HIGH (ref 0.8–1.2)
Prothrombin Time: 27.4 seconds — ABNORMAL HIGH (ref 11.4–15.2)

## 2019-05-16 LAB — GLUCOSE, CAPILLARY: Glucose-Capillary: 98 mg/dL (ref 70–99)

## 2019-05-16 LAB — APTT: aPTT: 36 seconds (ref 24–36)

## 2019-05-16 MED ORDER — GLYCOPYRROLATE 0.2 MG/ML IJ SOLN
0.2000 mg | INTRAMUSCULAR | Status: DC | PRN
  Administered 2019-05-16: 0.2 mg via INTRAVENOUS
  Filled 2019-05-16: qty 1

## 2019-05-16 MED ORDER — GLYCOPYRROLATE 1 MG PO TABS
1.0000 mg | ORAL_TABLET | ORAL | Status: DC | PRN
  Filled 2019-05-16: qty 1

## 2019-05-16 MED ORDER — LORAZEPAM 2 MG/ML IJ SOLN
2.0000 mg | INTRAMUSCULAR | Status: DC | PRN
  Administered 2019-05-16: 2 mg via INTRAVENOUS
  Filled 2019-05-16 (×2): qty 1

## 2019-05-16 MED ORDER — MORPHINE SULFATE (PF) 2 MG/ML IV SOLN
2.0000 mg | INTRAVENOUS | Status: DC | PRN
  Administered 2019-05-16 (×2): 2 mg via INTRAVENOUS
  Filled 2019-05-16 (×2): qty 1

## 2019-05-16 MED ORDER — MORPHINE SULFATE (PF) 2 MG/ML IV SOLN
2.0000 mg | INTRAVENOUS | Status: DC | PRN
  Administered 2019-05-16: 2 mg via INTRAVENOUS
  Administered 2019-05-17: 4 mg via INTRAVENOUS
  Administered 2019-05-17 (×2): 2 mg via INTRAVENOUS
  Filled 2019-05-16 (×2): qty 1
  Filled 2019-05-16: qty 2
  Filled 2019-05-16: qty 1

## 2019-05-16 MED ORDER — ORAL CARE MOUTH RINSE
15.0000 mL | Freq: Two times a day (BID) | OROMUCOSAL | Status: DC
  Administered 2019-05-16: 15 mL via OROMUCOSAL

## 2019-05-16 MED ORDER — ACETAMINOPHEN 325 MG PO TABS: 650.0000 mg | ORAL_TABLET | Freq: Four times a day (QID) | ORAL | Status: DC | PRN

## 2019-05-16 MED ORDER — POLYVINYL ALCOHOL 1.4 % OP SOLN
1.0000 [drp] | Freq: Four times a day (QID) | OPHTHALMIC | Status: DC | PRN
  Filled 2019-05-16: qty 15

## 2019-05-16 MED ORDER — GLYCOPYRROLATE 0.2 MG/ML IJ SOLN: 0.2000 mg | INTRAMUSCULAR | Status: DC | PRN

## 2019-05-16 MED ORDER — BIOTENE DRY MOUTH MT LIQD: 15.0000 mL | OROMUCOSAL | Status: DC | PRN

## 2019-05-16 MED ORDER — ACETAMINOPHEN 650 MG RE SUPP: 650.0000 mg | Freq: Four times a day (QID) | RECTAL | Status: DC | PRN

## 2019-05-16 NOTE — Progress Notes (Addendum)
Silo, Pt's step daughter, and she gave telephone consent to administer Blood to pt as ordered. Charge Nurse Angela Nevin, RN at bedside as second witness.

## 2019-05-16 NOTE — Progress Notes (Signed)
Received pt alert and oriented to self only. Noted pt with slurred speech unable to communicated well. MD is aware. Noted pt with general weakness. Will monitor pt.

## 2019-05-16 NOTE — Progress Notes (Signed)
Subjective/Chief Complaint: Just grunts to questions   Objective: Vital signs in last 24 hours: Temp:  [97.8 F (36.6 C)-99.9 F (37.7 C)] 98.2 F (36.8 C) (04/23 0416) Pulse Rate:  [66-94] 81 (04/23 0739) Resp:  [16-27] 20 (04/23 0739) BP: (115-153)/(70-118) 153/89 (04/23 0739) SpO2:  [93 %-100 %] 98 % (04/23 0739) Last BM Date: (unable to assess, pt confused)  Intake/Output from previous day: 04/22 0701 - 04/23 0700 In: 400 [IV Piggyback:400] Out: -  Intake/Output this shift: No intake/output data recorded.  General appearance: cooperative Resp: clear to auscultation bilaterally Cardio: regular rate and rhythm GI: incarcerated supraumbilical hernia, no skin changes, fluid wave in abdomen  Lab Results:  Recent Labs    05/15/19 1512 05/15/19 1512 05/15/19 1911 05/16/19 0656  WBC 7.0  --   --  7.0  HGB 8.8*   < > 9.5* 9.2*  HCT 30.6*   < > 32.5* 31.4*  PLT 78*  --   --  79*   < > = values in this interval not displayed.   BMET Recent Labs    05/15/19 1512 05/16/19 0656  NA 133* 133*  K 4.5 4.1  CL 101 100  CO2 22 26  GLUCOSE 100* 99  BUN 14 11  CREATININE 1.11 0.94  CALCIUM 7.8* 8.0*   PT/INR Recent Labs    05/15/19 1512 05/16/19 0656  LABPROT 34.7* 27.4*  INR 3.5* 2.6*   ABG No results for input(s): PHART, HCO3 in the last 72 hours.  Invalid input(s): PCO2, PO2  Studies/Results: CT Head Wo Contrast  Result Date: 05/15/2019 CLINICAL DATA:  69 year old male with altered mental status. EXAM: CT HEAD WITHOUT CONTRAST TECHNIQUE: Contiguous axial images were obtained from the base of the skull through the vertex without intravenous contrast. COMPARISON:  01/25/2011 and prior CTs FINDINGS: Brain: No evidence of acute infarction, hemorrhage, hydrocephalus, extra-axial collection or mass lesion/mass effect. Atrophy and chronic small-vessel white matter ischemic changes again noted. Vascular: No hyperdense vessel or unexpected calcification. Skull:  Normal. Negative for fracture or focal lesion. Sinuses/Orbits: No acute finding. Other: None. IMPRESSION: No evidence of acute intracranial abnormality. Atrophy and chronic small-vessel white matter ischemic changes. Electronically Signed   By: Margarette Canada M.D.   On: 05/15/2019 16:20   CT Abdomen Pelvis W Contrast  Result Date: 05/15/2019 CLINICAL DATA:  Altered mental status, unclear cause, nonlocalized EXAM: CT ABDOMEN AND PELVIS WITH CONTRAST TECHNIQUE: Multidetector CT imaging of the abdomen and pelvis was performed using the standard protocol following bolus administration of intravenous contrast. CONTRAST:  193mL OMNIPAQUE IOHEXOL 300 MG/ML  SOLN COMPARISON:  CT 02/16/2016 FINDINGS: Lower chest: Trace bilateral effusions, right slightly greater than left with adjacent atelectatic changes. Normal heart size. No pericardial effusion. Hepatobiliary: Heterogeneity of the hepatic attenuation with a nodular liver surface contour compatible with cirrhosis. There is an ill-defined region some faint peripheral contrast enhancement and centrally isoattenuating tissue in segment III of the liver measuring approximately 3.2 cm (coronal 6/43). This finding likely corresponds to the isoechoic lesion seen on recent abdominal ultrasound. There is a more centrally hypoattenuating focus in the posterior right lobe liver measuring 1.5 cm in size which is not clearly present on comparison studies. Minimal gallbladder wall thickening without focal pericholecystic inflammation. Finding is nonspecific in the setting of intrinsic liver disease. No visible calcified gallstones or biliary ductal dilatation. Pancreas: Diffuse mild pancreatic atrophy. No focal peripancreatic inflammation or ductal dilatation. Spleen: Borderline splenomegaly. Few punctate calcifications in the spleen likely reflect sequela  of prior granulomatous disease. Adrenals/Urinary Tract: Normal adrenal glands. Kidneys enhance and excrete symmetrically. No  concerning renal mass, urolith or hydronephrosis. Urinary bladder is unremarkable. Stomach/Bowel: Distal esophagus, stomach and duodenum are unremarkable. A small portion bowels is seen protruding into a small umbilical hernia anteriorly with loculated free fluid within the hernia sac, hernia defect measures approximately 2 x 1.6 cm in transverse by craniocaudal dimension. No evidence of high-grade upstream obstruction. Some mild thickening of the bowel is nonspecific but could suggest some early vascular compromise. More distal small bowel is unremarkable. There is some mild edematous mural thickening predominately of the proximal colon to the level of the hepatic flexure. Nonspecific given additional features liver disease. Vascular/Lymphatic: Atherosclerotic plaque within the normal caliber aorta. No suspicious or enlarged lymph nodes in the included lymphatic chains. Reproductive: Coarse eccentric calcification of the prostate. No concerning abnormalities of the prostate or seminal vesicles. Other: Small to moderate volume ascites predominantly within the subphrenic spaces. Bowel containing ventral hernia arising 3.1 cm superior to the umbilicus. Better detailed in the bowel section above. No other bowel containing hernia is seen. Mild circumferential body wall edema. No abdominopelvic free air. Musculoskeletal: Multilevel degenerative changes are present in the imaged portions of the spine. Prior fusion across the SI joints. Bilateral L5 pars defects are noted with grade 1 anterolisthesis L5 on S1. Multilevel degenerative changes are present in the imaged portions of the spine. Degenerative changes most pronounced at the L5-S1 level with vacuum disc phenomenon. Remote posttraumatic changes of the bilateral pubic rami and pubic bodies. IMPRESSION: 1. Cirrhosis with a 3.2 cm ill-defined region in segment III of the liver likely corresponds to the isoechoic lesion seen on recent abdominal ultrasound. Such finding  is highly worrisome for potential malignancy in the setting of intrinsic liver disease. 2. Centrally hypoattenuating focus in the posterior right lobe liver measuring 1.5 cm in size which is not clearly present on comparison studies. While this could reflect benign hepatic cysts, a more insidious lesion such as [place] could have a similar appearance. 3. Small to moderate volume ascites predominantly within the subphrenic spaces. Mild circumferential body wall edema. 4. Small bowel containing supraumbilical ventral hernia without resulting obstruction but some mild thickening and hyperemia of the bowel is nonspecific but could suggest some early vascular compromise. Correlate with exam findings and potential reduceability. 5. Features of anasarca with bilateral effusions, ascites, and body wall edema. 6. Minimal gallbladder wall thickening without focal pericholecystic inflammation. Finding is nonspecific in the setting of intrinsic liver disease. 7. Bilateral L5 pars defects with grade 1 anterolisthesis L5 on S1. 8. Aortic Atherosclerosis (ICD10-I70.0). These results were called by telephone at the time of interpretation on 05/15/2019 at 8:33 pm to provider [facility] , who verbally acknowledged these results. Electronically Signed   By: Lovena Le M.D.   On: 05/15/2019 20:33   DG Chest Port 1 View  Result Date: 05/15/2019 CLINICAL DATA:  Confusion, failure to thrive EXAM: PORTABLE CHEST 1 VIEW COMPARISON:  Radiograph 03/26/2019 FINDINGS: Low volumes with streaky opacities in the lung bases favoring atelectasis. No focal consolidative opacity, pneumothorax or effusion. Mild cardiomegaly is similar to comparison portable radiographs. No acute osseous or soft tissue abnormality. Degenerative changes are present in the imaged spine and shoulders. Telemetry leads overlie the chest. IMPRESSION: 1. Low volumes with streaky opacities in the lung bases favoring atelectasis. 2. Mild cardiomegaly without edema or  effusion. Electronically Signed   By: Lovena Le M.D.   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   I called his step daughter, [name] [phone], and she reports he has had this hernia for a long time. She says his doctors told him he cannot have it repaired due to his advanced cirrhosis and heart failure. She says he is no longer able to stand and they cannot care for him at home any longer. She feels he should go to hospice. I agree he would have a very high mortality from any surgery and we discussed this. She asks that we do not do any surgery and I agree with this. I will consult Palliative Care so they can look into hospice placement.  LOS: 1 day    Zenovia Jarred 05/16/2019

## 2019-05-16 NOTE — Consult Note (Signed)
Consultation Note Date: 05/16/2019   Patient Name: [name]  DOB: 15-Dec-1950  MRN: [id number]  Age / Sex: 69 y.o., male  PCP: Darren Rakes, MD Referring Physician: Orene Desanctis, DO  Reason for Consultation: Establishing goals of care and Hospice Evaluation  HPI/Patient Profile: 69 y.o. male  with past medical history of end stage cirrhosis, history of prolong QTc, HTN, diabetes admitted on 05/15/2019 with AMS and decreased mobility and overall functional decline.   Clinical Assessment and Goals of Care: I met today at [name] bedside and no visitors/family present. [place] has been the point of contact per notes.   I called and spoke with [name]. [name] explains to me that her mother, [name], is married to [name] and they have been together since 2009. [name] is reported to have an advancing dementia and unable to make decisions. [name] has been trying to help them both. [name] reports decline over months and significant decline over the past week when he did not even know who [name] was and had 3 falls in ~24 hours. [name] says that [name] was told last year that he may only have 6 months to live. He continues to drink alcohol. [name] reports that they had discussed previously and that [name] would want to be comfortable if he were nearing end of life especially now with concern for liver lesions concerning for malignancy. [name] has had many family members that have died and feels that she is seeing similar decline in [name] that his time is short. She is interested in full comfort care, DNR, and agrees with transition to [facility] for end of life care. [name] was appropriately tearful.   [name] reports that [name] has no biological children but does have a brother that she will try and contact but no other family. We have no contact information for any other family members  other than [name] and [name]. [name] does appear to be making decisions in good faith based on their previous conversations for [name]. She also has very good understanding of his underlying health issues prior and since hospitalization.   Primary Decision Maker NEXT OF KIN wife [name] (with dementia); [name]'s daughter [name] making decisions in her mother's place [phone]    SUMMARY OF RECOMMENDATIONS   - DNR - Comfort care - Transition to [place] Planning:  DNR   Symptom Management:   PRN morphine and ativan for comfort.   Palliative Prophylaxis:   Aspiration, Bowel Regimen, Delirium Protocol, Frequent Pain Assessment, Oral Care and Turn Reposition  Additional Recommendations (Limitations, Scope, Preferences):  Full Comfort Care  Psycho-social/Spiritual:   Desire for further Chaplaincy support:no  Additional Recommendations: Education on Hospice and Grief/Bereavement Support  Prognosis:   < 2 weeks given declining functional status with underlying/advancing liver cirrhosis, possible SBP, incarcerated small bowel containing supraumbilical hernia (poor surgical candidate). Also new concern for liver malignancy.   Discharge Planning: Hospice facility      Primary Diagnoses: Present on  Admission: . AMS (altered mental status) . Thrombocytopenia ([place]) . Cirrhosis of liver ([place]) . Hypertension   I have reviewed the medical record, interviewed the patient and family, and examined the patient. The following aspects are pertinent.  Past Medical History:  Diagnosis Date  . Abdominal hernia   . Arthritis    "hands" (02/16/2016)  . Bright red rectal bleeding    "@ least 1-2 times/month; sometimes more" (02/16/2016)  . Cirrhosis ([place])   . Hypertension   . Type II diabetes mellitus ([place])    Social History   Socioeconomic History  . Marital status: Single    Spouse name: Not on file  . Number of children: Not on file  .  Years of education: Not on file  . Highest education level: Not on file  Occupational History  . 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 Smoking status: Former Smoker    Packs/day: 0.50    Years: 38.00    Pack years: 19.00    Types: Cigarettes    Quit date: 2009    Years since quitting: 12.3  . Smokeless tobacco: Never Used  Substance and Sexual Activity  . Alcohol use: No    Comment: Quit in 4/18  . Drug use: No  . Sexual activity: Not Currently  Other Topics Concern  . Not on file  Social History Narrative  . Not on file   Social Determinants of Health   Financial Resource Strain:   . Difficulty of Paying Living Expenses:   Food Insecurity:   . Worried About Charity fundraiser in the Last Year:   . Arboriculturist in the Last Year:   Transportation Needs:   . Film/video editor (Medical):   Marland Kitchen Lack of Transportation (Non-Medical):   Physical Activity:   . Days of Exercise per Week:   . Minutes of Exercise per Session:   Stress:   . Feeling of Stress :   Social Connections:   . Frequency of Communication with Friends and Family:   . Frequency of Social Gatherings with Friends and Family:   . Attends Religious Services:   . Active Member of Clubs or Organizations:   . Attends Archivist Meetings:   Marland Kitchen Marital Status:    Family History  Problem Relation Age of Onset  . Hypertension Other    Scheduled Meds: . LORazepam  0-4 mg Intravenous Q6H   Or  . LORazepam  0-4 mg Oral Q6H  . [START ON 05/18/2019] LORazepam  0-4 mg Intravenous Q12H   Or  . [START ON 05/18/2019] LORazepam  0-4 mg Oral Q12H  . mouth rinse  15 mL Mouth Rinse BID  . thiamine  100 mg Oral Daily   Or  . thiamine  100 mg Intravenous Daily   Continuous Infusions: . cefTRIAXone (ROCEPHIN)  IV Stopped (05/15/19 2255)   PRN Meds:. No Known Allergies Review of Systems  Unable to perform ROS: Acuity of condition    Physical Exam Vitals and nursing  note reviewed.  Constitutional:      General: He is not in acute distress.    Appearance: He is ill-appearing and diaphoretic.  Cardiovascular:     Rate and Rhythm: Normal rate.  Pulmonary:     Effort: Pulmonary effort is normal. No tachypnea, accessory muscle usage or respiratory distress.  Abdominal:     General: There is distension.     Palpations: Abdomen is soft.  Comments: Hernia present  Neurological:     Mental Status: He is lethargic, disoriented and confused.     Comments: He opens eyes but does not track. He has tremor. Mumbles on occasion but nothing intelligible.      Vital Signs: BP (!) 153/89 (BP Location: Left Arm)   Pulse 84   Temp 98.2 F (36.8 C) (Oral)   Resp 18   Ht _0  (1.803 m)   SpO2 100%   BMI 27.89 kg/m  Pain Scale: PAINAD   Pain Score: 10-Worst pain ever   SpO2: SpO2: 100 % O2 Device:SpO2: 100 % O2 Flow Rate: .O2 Flow Rate (L/min): 1 L/min  IO: Intake/output summary:   Intake/Output Summary (Last 24 hours) at 05/16/2019 1051 Last data filed at 05/16/2019 0858 Gross per 24 hour  Intake 400 ml  Output --  Net 400 ml    LBM: Last BM Date: (unable to assess, pt confused) Baseline Weight:   Most recent weight:       Palliative Assessment/Data:     Time In: 1200 Time Out: 1300 Time Total: 60 min Greater than 50%  of this time was spent counseling and coordinating care related to the above assessment and plan.  Signed by: Vinie Sill, NP Palliative Medicine Team Pager # [phone] (M-F 8a-5p) Team Phone # [phone] (Nights/Weekends)

## 2019-05-16 NOTE — Evaluation (Signed)
Clinical/Bedside Swallow Evaluation Patient Details  Name: [name] MRN: [id number] Date of Birth: August 28, 1950  Today's Date: 05/16/2019 Time: SLP Start Time (ACUTE ONLY): 0907 SLP Stop Time (ACUTE ONLY): 0919 SLP Time Calculation (min) (ACUTE ONLY): 12 min  Past Medical History:  Past Medical History:  Diagnosis Date  . Abdominal hernia   . Arthritis    "hands" (02/16/2016)  . Bright red rectal bleeding    "@ least 1-2 times/month; sometimes more" (02/16/2016)  . Cirrhosis ([place])   . Hypertension   . Type II diabetes mellitus ([place])    Past Surgical History:  Past Surgical History:  Procedure Laterality Date  . COLONOSCOPY N/A 02/17/2016   Procedure: COLONOSCOPY;  Surgeon: [name], MD;  [facility] END[facility]COPY;  Service: Endoscopy;  Laterality: N/A;  . ESOPHAGOGASTRODUODENOSCOPY N/A 02/17/2016   Procedure: ESOPHAGOGASTRODUODENOSCOPY (EGD);  Surgeon: [name], MD;  Location: [facility] ENDOSCOPY;  Service: Endoscopy;  Laterality: N/A;  . FRACTURE SURGERY    . IR PARACENTESIS  08/14/2016  . [place]   "has steel rods in it"   HPI:  [name] is a 69 y.o. male with medical history significant for type 2 diabetes, hypertension, alcoholic cirrhosis, history of prolonged QT who presents with concerns of altered mental status and decreased mobility per family. CXR reported: "Low volumes with streaky opacities in the lung bases favoring   Assessment / Plan / Recommendation Clinical Impression  Pt was seen for a bedside swallow evaluation and he presents with suspected cognitive-based dysphagia. Pt was encountered awake/alert.  He grunted in response to SLP questions and he followed a few commands, but he did not have any meaningful verbalizations throughout this evaluation.  Oral cavity appeared functional to limited evaluation and dentition (natural vs dentures) was observed.  Pt was unable to follow commands to complete an oral mechanism exam.  He consumed trials of  ice chips, thin liquid, and puree.  Pt required moderate verbal and tactile cues for labial opening, but he demonstrated good labial closure around the spoon and straw.  He was able to draw liquid from the straw interdependently given extra time.  AP transport appeared mildly prolonged with puree trials, but suspect timely swallow initiation with all trials.  No overt s/sx of aspiration were observed with any trials.    Recommend initiation of Dysphagia 1 (puree) solids and thin liquids with medications administered crushed in puree.  Suspect that pt will not have sufficient PO intake to meet his nutritional needs given his current mentation.  He will benefit from 1:1 assistance with meals and full supervision to cue for the following compensatory strategies: 1) Only feed when awake/alert 2) Small bites/sips 3) Sit upright as possible 4) Slow rate of intake 5) Check for oral clearance after puree/crushed meds.  SLP will f/u to monitor diet tolerance and to make adjustments as appropriate.   SLP Visit Diagnosis: Dysphagia, unspecified (R13.10)    Aspiration Risk  Mild aspiration risk    Diet Recommendation Dysphagia 1 (Puree);Thin liquid   Liquid Administration via: Straw;Spoon Medication Administration: Crushed with puree Supervision: Staff to assist with self feeding;Full supervision/cueing for compensatory strategies Compensations: Minimize environmental distractions;Slow rate;Small sips/bites;Other (Comment)(check to make sure that oral cavity is clear after PO intake) Postural Changes: Seated upright at 90 degrees    Other  Recommendations Oral Care Recommendations: Oral care BID;Staff/trained caregiver to provide oral care Other Recommendations: Have oral suction available   Follow up Recommendations Skilled Nursing facility  Frequency and Duration min 2x/week  2 weeks       Prognosis Prognosis for Safe Diet Advancement: Fair Barriers to Reach Goals: Cognitive deficits       Swallow Study   General HPI: [name] is a 69 y.o. male with medical history significant for type 2 diabetes, hypertension, alcoholic cirrhosis, history of prolonged QT who presents with concerns of altered mental status and decreased mobility per family. CXR reported: "Low volumes with streaky opacities in the lung bases favoring Type of Study: Bedside Swallow Evaluation Previous Swallow Assessment: None  Diet Prior to this Study: NPO Temperature Spikes Noted: Yes Respiratory Status: Nasal cannula History of Recent Intubation: No Behavior/Cognition: Alert;Confused;Agitated;Impulsive;Requires cueing Oral Cavity Assessment: Within Functional Limits Oral Care Completed by SLP: No Oral Cavity - Dentition: Other (Comment)(Suspect top and bottom dentures, but possibly natural) Patient Positioning: Upright in bed Baseline Vocal Quality: Normal Volitional Cough: Cognitively unable to elicit Volitional Swallow: Unable to elicit    Oral/Motor/Sensory Function Overall Oral Motor/Sensory Function: Other (comment)(Pt unable to follow commands to complete OME )   Ice Chips Ice chips: Within functional limits Presentation: Spoon   Thin Liquid Thin Liquid: Within functional limits Presentation: Spoon;Straw    Nectar Thick Nectar Thick Liquid: Not tested   Honey Thick Honey Thick Liquid: Not tested   Puree Puree: Impaired Presentation: Spoon Oral Phase Functional Implications: Prolonged oral transit   Solid     Solid: Not tested     [name] M.S., CCC-SLP Acute Rehabilitation Services Office: [phone]   Elvia Collum Farley Crooker 05/16/2019,9:32 AM

## 2019-05-16 NOTE — Progress Notes (Signed)
Received call from RN that Mr. Fabry is restless/moaning and agitated despite current orders for comfort.    Chart reviewed including review of MAR.  Goals is full comfort with plan for transition to [facility] once bed available.  Will increase prn morphine and ativan.   Micheline Rough, MD [place] Palliative Medicine Team [phone]  NO CHARGE NOTE

## 2019-05-16 NOTE — TOC Initial Note (Signed)
Transition of Care [facility]) - Initial/Assessment Note    Patient Details  Name: [name] MRN: [id number] Date of Birth: Oct 13, 1950  Transition of Care [facility]) CM/SW Contact:    Marilu Favre, RN Phone Number: 05/16/2019, 12:48 PM  Clinical Narrative:                  Spoke to patient's step daughter [name] [id number], she is patient's wife ( has dementia) daughter.   Discussed residential hospice with [name] she would like [facility]. Referral made to [facility] with [facility] , awaiting determination.  Expected Discharge Plan: [place] Barriers to Discharge: Continued Medical Work up   Patient Goals and CMS Choice     Choice offered to / list presented to : Adult Children  Expected Discharge Plan and Services Expected Discharge Plan: [place]   Discharge Planning Services: CM Consult Post Acute Care Choice: Durable Medical Equipment Living arrangements for the past 2 months: Single Family Home                   DME Agency: NA       HH Arranged: NA          Prior Living Arrangements/Services Living arrangements for the past 2 months: Single Family Home Lives with:: Spouse Patient language and need for interpreter reviewed:: Yes                 Activities of Daily Living      Permission Sought/Granted                  Emotional Assessment              Admission diagnosis:  Confusion [R41.0] Hernia of small intestine [K46.9] Ascites due to alcoholic cirrhosis ([place]) [id number] AMS (altered mental status) [R41.82] Patient Active Problem List   Diagnosis Date Noted  . AMS (altered mental status) 05/15/2019  . Lesion of liver 05/15/2019  . Anemia 05/15/2019  . Elevated INR 05/15/2019  . SIRS (systemic inflammatory response syndrome) ([place]) 08/14/2016  . Type 2 diabetes mellitus ([place]) 08/14/2016  . Prolonged QT interval 08/14/2016  . Anxiety 03/06/2016  . Insomnia 03/06/2016  . Hypertension  03/06/2016  . Hepatitis C antibody test positive 02/18/2016  . Thrombocytopenia ([place]) 02/16/2016  . Normochromic normocytic anemia 02/16/2016  . Alcohol abuse 02/16/2016  . Cirrhosis of liver ([place]) 02/16/2016   PCP:  Charlott Rakes, MD Pharmacy:   [id number] - [place], [place] [id number] [place] [id number] Phone: [phone] Fax: [phone]     Social Determinants of Health (SDOH) Interventions    Readmission Risk Interventions No flowsheet data found.

## 2019-05-16 NOTE — Progress Notes (Signed)
PROGRESS NOTE    Masaaki Sturgell  [id number] DOB: Oct 18, 1950 DOA: 05/15/2019 PCP: Charlott Rakes, MD    Brief Narrative:  69 year old gentleman with history of type 2 diabetes, hypertension, alcoholic cirrhosis, ongoing alcoholism presented to the emergency room with altered mental status and decreased mobility.  Patient also complained of some abdominal pain.  In the emergency room, he had a temperature 99.9.  Blood pressure stable.  Total bilirubin 4.4, INR 3.5.  Also noted to have incarcerated ventral hernia.  He was admitted to the hospital with diagnosis of altered mental status likely due to decompensated cirrhosis, SBP and incarcerated ventral hernia.   Assessment & Plan:   Principal Problem:   AMS (altered mental status) Active Problems:   Thrombocytopenia ([place])   Cirrhosis of liver ([place])   Hypertension   Type 2 diabetes mellitus ([place])   Lesion of liver   Anemia   Elevated INR   Hernia of small intestine   Goals of care, counseling/discussion   Palliative care by specialist  Acute metabolic encephalopathy in the setting of underlying cirrhosis in a patient with ongoing alcoholism, new liver lesion suspected malignancy: Alcohol use and abuse End-stage chronic liver disease with thrombocytopenia and anemia of chronic disease, coagulopathy Failure to thrive.  Plan: Patient has end-stage liver disease and now with encephalopathy.  He has had very high mortality and no chance of recovery. As per discussion with surgery, palliative care and family a comfort care approach was planned. Starting on comfort care measures.  All symptom control medications available. Transition to inpatient hospice when bed available. No escalation of care.  No lab draws. RN to pronounce death if happens in the hospital.   DVT prophylaxis: Comfort care Code Status: Comfort care Family Communication: Patient's stepdaughter called by surgery and palliative care Disposition Plan: Status is:  Inpatient  Remains inpatient appropriate because:Unsafe d/c plan   Dispo: The patient is from: Home              Anticipated d/c is to: hospice home               Anticipated d/c date is: 1 day              Patient currently is not medically stable to d/c. can only go to inpatient hospice    Consultants:   Surgery  Palliative medicine  Procedures:   None  Antimicrobials:  Anti-infectives (From admission, onward)   Start     Dose/Rate Route Frequency Ordered Stop   05/15/19 2130  cefTRIAXone (ROCEPHIN) 1 g in sodium chloride 0.9 % 100 mL IVPB     1 g 200 mL/hr over 30 Minutes Intravenous Every 24 hours 05/15/19 2117           Subjective: Patient seen and examined.  He is totally confused.  Nursing reported giving some dose of Ativan early morning for agitation.  Unable to provide any history.  Objective: Vitals:   05/16/19 0416 05/16/19 0739 05/16/19 0957 05/16/19 0957  BP: (!) 152/89 (!) 153/89    Pulse: 84 81  84  Resp: 18 20  18   Temp: 98.2 F (36.8 C)  98.2 F (36.8 C) 98.2 F (36.8 C)  TempSrc: Oral  Oral Oral  SpO2: 95% 98%  100%  Height:        Intake/Output Summary (Last 24 hours) at 05/16/2019 1408 Last data filed at 05/16/2019 1100 Gross per 24 hour  Intake 400 ml  Output --  Net 400 ml  There were no vitals filed for this visit.  Examination:  General exam: Appears chronically sick looking.  Lethargic and confused.  Not interactive. Respiratory system: Clear to auscultation. Respiratory effort normal.  No added sounds. Cardiovascular system: S1 & S2 heard, RRR.  Gastrointestinal system: Abdomen is distended but nontender.  Midline ventral hernia present.    Central nervous system: Sleepy and lethargic.  Moving all extremities.   Data Reviewed: I have personally reviewed following labs and imaging studies  CBC: Recent Labs  Lab 05/15/19 1512 05/15/19 1911 05/16/19 0656  WBC 7.0  --  7.0  NEUTROABS 5.7  --   --   HGB 8.8* 9.5* 9.2*   HCT 30.6* 32.5* 31.4*  MCV 87.7  --  86.7  PLT 78*  --  79*   Basic Metabolic Panel: Recent Labs  Lab 05/15/19 1512 05/16/19 0656  NA 133* 133*  K 4.5 4.1  CL 101 100  CO2 22 26  GLUCOSE 100* 99  BUN 14 11  CREATININE 1.11 0.94  CALCIUM 7.8* 8.0*  MG 1.8  --    GFR: CrCl cannot be calculated (Unknown ideal weight.). Liver Function Tests: Recent Labs  Lab 05/15/19 1512 05/16/19 0656  AST 68* 66*  ALT 43 42  ALKPHOS 64 62  BILITOT 4.4* 4.1*  PROT 6.0* 6.5  ALBUMIN 1.4* 1.7*   Recent Labs  Lab 05/15/19 1512  LIPASE 47   Recent Labs  Lab 05/15/19 1512  AMMONIA 20   Coagulation Profile: Recent Labs  Lab 05/15/19 1512 05/16/19 0656  INR 3.5* 2.6*   Cardiac Enzymes: No results for input(s): CKTOTAL, CKMB, CKMBINDEX, TROPONINI in the last 168 hours. BNP (last 3 results) No results for input(s): PROBNP in the last 8760 hours. HbA1C: No results for input(s): HGBA1C in the last 72 hours. CBG: Recent Labs  Lab 05/16/19 0724  GLUCAP 98   Lipid Profile: No results for input(s): CHOL, HDL, LDLCALC, TRIG, CHOLHDL, LDLDIRECT in the last 72 hours. Thyroid Function Tests: No results for input(s): TSH, T4TOTAL, FREET4, T3FREE, THYROIDAB in the last 72 hours. Anemia Panel: Recent Labs    05/15/19 1911  VITAMINB12 2,038*  FOLATE 8.1  FERRITIN 80  TIBC 249*  IRON 16*  RETICCTPCT 4.3*   Sepsis Labs: Recent Labs  Lab 05/15/19 1911 05/15/19 2200  LATICACIDVEN 2.7* 4.0*    Recent Results (from the past 240 hour(s))  Respiratory Panel by RT PCR (Flu A&B, Covid) - Nasopharyngeal Swab     Status: None   Collection Time: 05/15/19  5:19 PM   Specimen: Nasopharyngeal Swab  Result Value Ref Range Status   SARS Coronavirus 2 by RT PCR NEGATIVE NEGATIVE Final    Comment: (NOTE) SARS-CoV-2 target nucleic acids are NOT DETECTED. The SARS-CoV-2 RNA is generally detectable in upper respiratoy specimens during the acute phase of infection. The  lowest concentration of SARS-CoV-2 viral copies this assay can detect is 131 copies/mL. A negative result does not preclude SARS-Cov-2 infection and should not be used as the sole basis for treatment or other patient management decisions. A negative result may occur with  improper specimen collection/handling, submission of specimen other than nasopharyngeal swab, presence of viral mutation(s) within the areas targeted by this assay, and inadequate number of viral copies (<131 copies/mL). A negative result must be combined with clinical observations, patient history, and epidemiological information. The expected result is Negative. Fact Sheet for Patients:  PinkCheek.be Fact Sheet for Healthcare Providers:  GravelBags.it This test is not yet ap proved  or cleared by the [place] and  has been authorized for detection and/or diagnosis of SARS-CoV-2 by FDA under an Emergency Use Authorization (EUA). This EUA will remain  in effect (meaning this test can be used) for the duration of the COVID-19 declaration under Section 564(b)(1) of the Act, 21 U.S.C. section 360bbb-3(b)(1), unless the authorization is terminated or revoked sooner.    Influenza A by PCR NEGATIVE NEGATIVE Final   Influenza B by PCR NEGATIVE NEGATIVE Final    Comment: (NOTE) The Xpert Xpress SARS-CoV-2/FLU/RSV assay is intended as an aid in  the diagnosis of influenza from Nasopharyngeal swab specimens and  should not be used as a sole basis for treatment. Nasal washings and  aspirates are unacceptable for Xpert Xpress SARS-CoV-2/FLU/RSV  testing. Fact Sheet for Patients: PinkCheek.be Fact Sheet for Healthcare Providers: GravelBags.it This test is not yet approved or cleared by the [place] FDA and  has been authorized for detection and/or diagnosis of SARS-CoV-2 by  FDA under an Emergency  Use Authorization (EUA). This EUA will remain  in effect (meaning this test can be used) for the duration of the  Covid-19 declaration under Section 564(b)(1) of the Act, 21  U.S.C. section 360bbb-3(b)(1), unless the authorization is  terminated or revoked. Performed at [facility], [place] [address] [place], [place] [address]          Radiology Studies: CT Head Wo Contrast  Result Date: 05/15/2019 CLINICAL DATA:  69 year old male with altered mental status. EXAM: CT HEAD WITHOUT CONTRAST TECHNIQUE: Contiguous axial images were obtained from the base of the skull through the vertex without intravenous contrast. COMPARISON:  01/25/2011 and prior CTs FINDINGS: Brain: No evidence of acute infarction, hemorrhage, hydrocephalus, extra-axial collection or mass lesion/mass effect. Atrophy and chronic small-vessel white matter ischemic changes again noted. Vascular: No hyperdense vessel or unexpected calcification. Skull: Normal. Negative for fracture or focal lesion. Sinuses/Orbits: No acute finding. Other: None. IMPRESSION: No evidence of acute intracranial abnormality. Atrophy and chronic small-vessel white matter ischemic changes. Electronically Signed   By: Margarette Canada M.D.   On: 05/15/2019 16:20   CT Abdomen Pelvis W Contrast  Result Date: 05/15/2019 CLINICAL DATA:  Altered mental status, unclear cause, nonlocalized EXAM: CT ABDOMEN AND PELVIS WITH CONTRAST TECHNIQUE: Multidetector CT imaging of the abdomen and pelvis was performed using the standard protocol following bolus administration of intravenous contrast. CONTRAST:  156mL OMNIPAQUE IOHEXOL 300 MG/ML  SOLN COMPARISON:  CT 02/16/2016 FINDINGS: Lower chest: Trace bilateral effusions, right slightly greater than left with adjacent atelectatic changes. Normal heart size. No pericardial effusion. Hepatobiliary: Heterogeneity of the hepatic attenuation with a nodular liver surface contour compatible with cirrhosis. There is an ill-defined  region some faint peripheral contrast enhancement and centrally isoattenuating tissue in segment III of the liver measuring approximately 3.2 cm (coronal 6/43). This finding likely corresponds to the isoechoic lesion seen on recent abdominal ultrasound. There is a more centrally hypoattenuating focus in the posterior right lobe liver measuring 1.5 cm in size which is not clearly present on comparison studies. Minimal gallbladder wall thickening without focal pericholecystic inflammation. Finding is nonspecific in the setting of intrinsic liver disease. No visible calcified gallstones or biliary ductal dilatation. Pancreas: Diffuse mild pancreatic atrophy. No focal peripancreatic inflammation or ductal dilatation. Spleen: Borderline splenomegaly. Few punctate calcifications in the spleen likely reflect sequela of prior granulomatous disease. Adrenals/Urinary Tract: Normal adrenal glands. Kidneys enhance and excrete symmetrically. No concerning renal mass, urolith or hydronephrosis. Urinary bladder is  unremarkable. Stomach/Bowel: Distal esophagus, stomach and duodenum are unremarkable. A small portion bowels is seen protruding into a small umbilical hernia anteriorly with loculated free fluid within the hernia sac, hernia defect measures approximately 2 x 1.6 cm in transverse by craniocaudal dimension. No evidence of high-grade upstream obstruction. Some mild thickening of the bowel is nonspecific but could suggest some early vascular compromise. More distal small bowel is unremarkable. There is some mild edematous mural thickening predominately of the proximal colon to the level of the hepatic flexure. Nonspecific given additional features liver disease. Vascular/Lymphatic: Atherosclerotic plaque within the normal caliber aorta. No suspicious or enlarged lymph nodes in the included lymphatic chains. Reproductive: Coarse eccentric calcification of the prostate. No concerning abnormalities of the prostate or seminal  vesicles. Other: Small to moderate volume ascites predominantly within the subphrenic spaces. Bowel containing ventral hernia arising 3.1 cm superior to the umbilicus. Better detailed in the bowel section above. No other bowel containing hernia is seen. Mild circumferential body wall edema. No abdominopelvic free air. Musculoskeletal: Multilevel degenerative changes are present in the imaged portions of the spine. Prior fusion across the SI joints. Bilateral L5 pars defects are noted with grade 1 anterolisthesis L5 on S1. Multilevel degenerative changes are present in the imaged portions of the spine. Degenerative changes most pronounced at the L5-S1 level with vacuum disc phenomenon. Remote posttraumatic changes of the bilateral pubic rami and pubic bodies. IMPRESSION: 1. Cirrhosis with a 3.2 cm ill-defined region in segment III of the liver likely corresponds to the isoechoic lesion seen on recent abdominal ultrasound. Such finding is highly worrisome for potential malignancy in the setting of intrinsic liver disease. 2. Centrally hypoattenuating focus in the posterior right lobe liver measuring 1.5 cm in size which is not clearly present on comparison studies. While this could reflect benign hepatic cysts, a more insidious lesion such as [place] could have a similar appearance. 3. Small to moderate volume ascites predominantly within the subphrenic spaces. Mild circumferential body wall edema. 4. Small bowel containing supraumbilical ventral hernia without resulting obstruction but some mild thickening and hyperemia of the bowel is nonspecific but could suggest some early vascular compromise. Correlate with exam findings and potential reduceability. 5. Features of anasarca with bilateral effusions, ascites, and body wall edema. 6. Minimal gallbladder wall thickening without focal pericholecystic inflammation. Finding is nonspecific in the setting of intrinsic liver disease. 7. Bilateral L5 pars defects with grade 1  anterolisthesis L5 on S1. 8. Aortic Atherosclerosis (ICD10-I70.0). These results were called by telephone at the time of interpretation on 05/15/2019 at 8:33 pm to provider [facility] , who verbally acknowledged these results. Electronically Signed   By: Lovena Le M.D.   On: 05/15/2019 20:33   DG Chest Port 1 View  Result Date: 05/15/2019 CLINICAL DATA:  Confusion, failure to thrive EXAM: PORTABLE CHEST 1 VIEW COMPARISON:  Radiograph 03/26/2019 FINDINGS: Low volumes with streaky opacities in the lung bases favoring atelectasis. No focal consolidative opacity, pneumothorax or effusion. Mild cardiomegaly is similar to comparison portable radiographs. No acute osseous or soft tissue abnormality. Degenerative changes are present in the imaged spine and shoulders. Telemetry leads overlie the chest. IMPRESSION: 1. Low volumes with streaky opacities in the lung bases favoring atelectasis. 2. Mild cardiomegaly without edema or effusion. Electronically Signed   By: Lovena Le M.D.   On: 05/15/2019 17:41        Scheduled Meds: . LORazepam  0-4 mg Intravenous Q6H   Or  . LORazepam  0-4 mg  Oral Q6H  . [START ON 05/18/2019] LORazepam  0-4 mg Intravenous Q12H   Or  . [START ON 05/18/2019] LORazepam  0-4 mg Oral Q12H  . mouth rinse  15 mL Mouth Rinse BID  . thiamine  100 mg Oral Daily   Or  . thiamine  100 mg Intravenous Daily   Continuous Infusions: . cefTRIAXone (ROCEPHIN)  IV Stopped (05/15/19 2255)     LOS: 1 day    Time spent: 30 minutes    Barb Merino, MD [facility] Pager [phone]

## 2019-05-16 NOTE — Progress Notes (Signed)
Manufacturing engineer [facility])  Referral received for residential hospice at [facility].  Visited pt at the bedside, confirmed interest in [facility] with his step-daughter [name].  Will update TOC manager and family once bed availability has been determined.  Venia Carbon RN, BSN, [facility] Liaison

## 2019-05-17 LAB — BPAM FFP
Blood Product Expiration Date: 202104272359
Blood Product Expiration Date: 202104272359
ISSUE DATE / TIME: 202104230103
ISSUE DATE / TIME: 202104230246
Unit Type and Rh: 6200
Unit Type and Rh: 6200

## 2019-05-17 LAB — PREPARE FRESH FROZEN PLASMA

## 2019-05-17 NOTE — Progress Notes (Signed)
Manufacturing engineer [facility])  [facility] has a bed for Mr. Morvan today.  RN staff, please call [phone] at any time to give report.  Please fax d/c summary to [phone].  Thank you,  Venia Carbon RN, BSN, [facility] Liaison

## 2019-05-17 NOTE — Discharge Summary (Signed)
Physician Discharge Summary  [name] [id number] DOB: 1950-06-24 DOA: 05/15/2019  PCP: Darren Rakes, MD  Admit date: 05/15/2019 Discharge date: 05/17/2019  Admitted From: Home Disposition: Hospice home    Discharge Condition: Critical CODE STATUS: Comfort care Diet recommendation: Comfort  Discharge summary:  69 year old gentleman with history of type 2 diabetes, hypertension, alcoholic cirrhosis, ongoing alcoholism presented to the emergency room with altered mental status and decreased mobility.  Patient also complained of some abdominal pain.  In the emergency room, he had a temperature 99.9.  Blood pressure stable.  Total bilirubin 4.4, INR 3.5.  Also noted to have incarcerated ventral hernia.  He was admitted to the hospital with diagnosis of altered mental status likely due to decompensated cirrhosis, SBP and incarcerated ventral hernia.  Acute metabolic encephalopathy in the setting of underlying cirrhosis in a patient with ongoing alcoholism, new liver lesion suspected malignancy, Alcohol use and abuse, End-stage chronic liver disease with thrombocytopenia and anemia of chronic disease, coagulopathy. Failure to thrive.  Plan: Patient has end-stage liver disease and now with encephalopathy.  He has had very high mortality and no chance of recovery. As per discussion with surgery, palliative care and family a comfort care approach was planned.  Started on comfort care measures.  All symptom control medications available. Transfer to inpatient hospice to provide end-of-life care. Patient will be medicated for comfort before transferred.   Discharge Diagnoses:  Principal Problem:   AMS (altered mental status) Active Problems:   Thrombocytopenia (HCC)   Cirrhosis of liver (HCC)   Hypertension   Type 2 diabetes mellitus ([place])   Lesion of liver   Anemia   Elevated INR   Hernia of small intestine   Goals of care, counseling/discussion   Palliative care by  specialist    Discharge Instructions   Allergies as of 05/17/2019   No Known Allergies     Medication List    STOP taking these medications   cephALEXin 500 MG capsule Commonly known as: KEFLEX   furosemide 20 MG tablet Commonly known as: LASIX   furosemide 40 MG tablet Commonly known as: LASIX   gabapentin 100 MG capsule Commonly known as: Neurontin   oxyCODONE-acetaminophen 5-325 MG tablet Commonly known as: PERCOCET/ROXICET   spironolactone 100 MG tablet Commonly known as: ALDACTONE   spironolactone 50 MG tablet Commonly known as: ALDACTONE       No Known Allergies  Consultations:  Surgery  Palliative and hospice   Procedures/Studies: CT Head Wo Contrast  Result Date: 05/15/2019 CLINICAL DATA:  69 year old male with altered mental status. EXAM: CT HEAD WITHOUT CONTRAST TECHNIQUE: Contiguous axial images were obtained from the base of the skull through the vertex without intravenous contrast. COMPARISON:  01/25/2011 and prior CTs FINDINGS: Brain: No evidence of acute infarction, hemorrhage, hydrocephalus, extra-axial collection or mass lesion/mass effect. Atrophy and chronic small-vessel white matter ischemic changes again noted. Vascular: No hyperdense vessel or unexpected calcification. Skull: Normal. Negative for fracture or focal lesion. Sinuses/Orbits: No acute finding. Other: None. IMPRESSION: No evidence of acute intracranial abnormality. Atrophy and chronic small-vessel white matter ischemic changes. Electronically Signed   By: Margarette Canada M.D.   On: 05/15/2019 16:20   CT Abdomen Pelvis W Contrast  Result Date: 05/15/2019 CLINICAL DATA:  Altered mental status, unclear cause, nonlocalized EXAM: CT ABDOMEN AND PELVIS WITH CONTRAST TECHNIQUE: Multidetector CT imaging of the abdomen and pelvis was performed using the standard protocol following bolus administration of intravenous contrast. CONTRAST:  152mL OMNIPAQUE IOHEXOL 300 MG/ML  SOLN  COMPARISON:  CT  02/16/2016 FINDINGS: Lower chest: Trace bilateral effusions, right slightly greater than left with adjacent atelectatic changes. Normal heart size. No pericardial effusion. Hepatobiliary: Heterogeneity of the hepatic attenuation with a nodular liver surface contour compatible with cirrhosis. There is an ill-defined region some faint peripheral contrast enhancement and centrally isoattenuating tissue in segment III of the liver measuring approximately 3.2 cm (coronal 6/43). This finding likely corresponds to the isoechoic lesion seen on recent abdominal ultrasound. There is a more centrally hypoattenuating focus in the posterior right lobe liver measuring 1.5 cm in size which is not clearly present on comparison studies. Minimal gallbladder wall thickening without focal pericholecystic inflammation. Finding is nonspecific in the setting of intrinsic liver disease. No visible calcified gallstones or biliary ductal dilatation. Pancreas: Diffuse mild pancreatic atrophy. No focal peripancreatic inflammation or ductal dilatation. Spleen: Borderline splenomegaly. Few punctate calcifications in the spleen likely reflect sequela of prior granulomatous disease. Adrenals/Urinary Tract: Normal adrenal glands. Kidneys enhance and excrete symmetrically. No concerning renal mass, urolith or hydronephrosis. Urinary bladder is unremarkable. Stomach/Bowel: Distal esophagus, stomach and duodenum are unremarkable. A small portion bowels is seen protruding into a small umbilical hernia anteriorly with loculated free fluid within the hernia sac, hernia defect measures approximately 2 x 1.6 cm in transverse by craniocaudal dimension. No evidence of high-grade upstream obstruction. Some mild thickening of the bowel is nonspecific but could suggest some early vascular compromise. More distal small bowel is unremarkable. There is some mild edematous mural thickening predominately of the proximal colon to the level of the hepatic flexure.  Nonspecific given additional features liver disease. Vascular/Lymphatic: Atherosclerotic plaque within the normal caliber aorta. No suspicious or enlarged lymph nodes in the included lymphatic chains. Reproductive: Coarse eccentric calcification of the prostate. No concerning abnormalities of the prostate or seminal vesicles. Other: Small to moderate volume ascites predominantly within the subphrenic spaces. Bowel containing ventral hernia arising 3.1 cm superior to the umbilicus. Better detailed in the bowel section above. No other bowel containing hernia is seen. Mild circumferential body wall edema. No abdominopelvic free air. Musculoskeletal: Multilevel degenerative changes are present in the imaged portions of the spine. Prior fusion across the SI joints. Bilateral L5 pars defects are noted with grade 1 anterolisthesis L5 on S1. Multilevel degenerative changes are present in the imaged portions of the spine. Degenerative changes most pronounced at the L5-S1 level with vacuum disc phenomenon. Remote posttraumatic changes of the bilateral pubic rami and pubic bodies. IMPRESSION: 1. Cirrhosis with a 3.2 cm ill-defined region in segment III of the liver likely corresponds to the isoechoic lesion seen on recent abdominal ultrasound. Such finding is highly worrisome for potential malignancy in the setting of intrinsic liver disease. 2. Centrally hypoattenuating focus in the posterior right lobe liver measuring 1.5 cm in size which is not clearly present on comparison studies. While this could reflect benign hepatic cysts, a more insidious lesion such as [place] could have a similar appearance. 3. Small to moderate volume ascites predominantly within the subphrenic spaces. Mild circumferential body wall edema. 4. Small bowel containing supraumbilical ventral hernia without resulting obstruction but some mild thickening and hyperemia of the bowel is nonspecific but could suggest some early vascular compromise. Correlate  with exam findings and potential reduceability. 5. Features of anasarca with bilateral effusions, ascites, and body wall edema. 6. Minimal gallbladder wall thickening without focal pericholecystic inflammation. Finding is nonspecific in the setting of intrinsic liver disease. 7. Bilateral L5 pars defects with grade 1 anterolisthesis L5 on  S1. 8. Aortic Atherosclerosis (ICD10-I70.0). These results were called by telephone at the time of interpretation on 05/15/2019 at 8:33 pm to provider [facility] , who verbally acknowledged these results. Electronically Signed   By: Lovena Le M.D.   On: 05/15/2019 20:33   DG Chest Port 1 View  Result Date: 05/15/2019 CLINICAL DATA:  Confusion, failure to thrive EXAM: PORTABLE CHEST 1 VIEW COMPARISON:  Radiograph 03/26/2019 FINDINGS: Low volumes with streaky opacities in the lung bases favoring atelectasis. No focal consolidative opacity, pneumothorax or effusion. Mild cardiomegaly is similar to comparison portable radiographs. No acute osseous or soft tissue abnormality. Degenerative changes are present in the imaged spine and shoulders. Telemetry leads overlie the chest. IMPRESSION: 1. Low volumes with streaky opacities in the lung bases favoring atelectasis. 2. Mild cardiomegaly without edema or effusion. Electronically Signed   By: Lovena Le M.D.   On: 05/15/2019 17:41   VAS [place] LOWER EXTREMITY VENOUS (DVT) (ONLY [facility] & [facility] 7a-7p)  Result Date: 05/06/2019  Lower Venous DVTStudy Indications: Edema.  Comparison Study: no prior Performing Technologist: Abram Sander RVS  Examination Guidelines: A complete evaluation includes B-mode imaging, spectral Doppler, color Doppler, and power Doppler as needed of all accessible portions of each vessel. Bilateral testing is considered an integral part of a complete examination. Limited examinations for reoccurring indications may be performed as noted. The reflux portion of the exam is performed with the patient in reverse  Trendelenburg.  +---------+---------------+---------+-----------+----------+--------------+ RIGHT    CompressibilityPhasicitySpontaneityPropertiesThrombus Aging +---------+---------------+---------+-----------+----------+--------------+ CFV      Full           Yes      Yes                                 +---------+---------------+---------+-----------+----------+--------------+ SFJ      Full                                                        +---------+---------------+---------+-----------+----------+--------------+ FV Prox  Full                                                        +---------+---------------+---------+-----------+----------+--------------+ FV Mid   Full                                                        +---------+---------------+---------+-----------+----------+--------------+ FV DistalFull                                                        +---------+---------------+---------+-----------+----------+--------------+ PFV      Full                                                        +---------+---------------+---------+-----------+----------+--------------+  POP      Full           Yes      Yes                                 +---------+---------------+---------+-----------+----------+--------------+ PTV      Full                                                        +---------+---------------+---------+-----------+----------+--------------+ PERO                                                  Not visualized +---------+---------------+---------+-----------+----------+--------------+   +---------+---------------+---------+-----------+----------+--------------+ LEFT     CompressibilityPhasicitySpontaneityPropertiesThrombus Aging +---------+---------------+---------+-----------+----------+--------------+ CFV      Full           Yes      Yes                                  +---------+---------------+---------+-----------+----------+--------------+ SFJ      Full                                                        +---------+---------------+---------+-----------+----------+--------------+ FV Prox  Full                                                        +---------+---------------+---------+-----------+----------+--------------+ FV Mid   Full                                                        +---------+---------------+---------+-----------+----------+--------------+ FV DistalFull                                                        +---------+---------------+---------+-----------+----------+--------------+ PFV      Full                                                        +---------+---------------+---------+-----------+----------+--------------+ POP      Full           Yes      Yes                                 +---------+---------------+---------+-----------+----------+--------------+  PTV      Full                                                        +---------+---------------+---------+-----------+----------+--------------+ PERO                                                  Not visualized +---------+---------------+---------+-----------+----------+--------------+     Summary: BILATERAL: - No evidence of deep vein thrombosis seen in the lower extremities, bilaterally.   *See table(s) above for measurements and observations. Electronically signed by Deitra Mayo MD on 05/06/2019 at 3:15:22 PM.    Final    US Abdomen Limited RUQ  Result Date: 05/06/2019 CLINICAL DATA:  Elevated bilirubin EXAM: ULTRASOUND ABDOMEN LIMITED RIGHT UPPER QUADRANT COMPARISON:  05/03/2017 FINDINGS: Gallbladder: No definitive cholelithiasis is noted. Gallbladder wall is thickened likely in part due to decompression as well as mild ascites. Common bile duct: Not well visualized Liver: Nodularity and heterogeneity in the liver  is noted consistent with underlying cirrhosis. There is a 3.8 cm left lobe mass lesion identified which is relatively isoechoic with the adjacent liver. This has not been visualized on prior exams. Nonemergent MRI is recommended for further evaluation. Portal vein is patent on color Doppler imaging with normal direction of blood flow towards the liver. Other: None. IMPRESSION: Changes consistent with cirrhosis with ascites. No definitive biliary ductal dilatation is seen. 3.8 cm relatively isoechoic mass within the left lobe of the liver. Nonemergent MRI is recommended for further workup to allow for optimum imaging. Electronically Signed   By: Inez Catalina M.D.   On: 05/06/2019 17:53    Subjective: Patient seen and examined.  Medicated for discomfort at night.  Currently obtunded and unresponsive.   Discharge Exam: Vitals:   05/16/19 0957 05/17/19 0455  BP:  (!) 149/95  Pulse: 84 76  Resp: 18 17  Temp: 98.2 F (36.8 C) 99.1 F (37.3 C)  SpO2: 100% 90%   Vitals:   05/16/19 0739 05/16/19 0957 05/16/19 0957 05/17/19 0455  BP: (!) 153/89   (!) 149/95  Pulse: 81  84 76  Resp: 20  18 17   Temp:  98.2 F (36.8 C) 98.2 F (36.8 C) 99.1 F (37.3 C)  TempSrc:  Oral Oral Oral  SpO2: 98%  100% 90%  Height:        General: Sick looking.  Obtunded.  Deep sonorous breathing. Icteric. Cardiovascular: RRR, S1/S2  Respiratory: CTA bilaterally, no wheezing, no rhonchi, conducted airway sounds. Abdominal: Soft, NT, distended. bowel sounds + Extremities: no edema, no cyanosis    The results of significant diagnostics from this hospitalization (including imaging, microbiology, ancillary and laboratory) are listed below for reference.     Microbiology: Recent Results (from the past 240 hour(s))  Respiratory Panel by RT PCR (Flu A&B, Covid) - Nasopharyngeal Swab     Status: None   Collection Time: 05/15/19  5:19 PM   Specimen: Nasopharyngeal Swab  Result Value Ref Range Status   SARS  Coronavirus 2 by RT PCR NEGATIVE NEGATIVE Final    Comment: (NOTE) SARS-CoV-2 target nucleic acids are NOT DETECTED. The SARS-CoV-2 RNA is generally detectable in upper respiratoy specimens during the  acute phase of infection. The lowest concentration of SARS-CoV-2 viral copies this assay can detect is 131 copies/mL. A negative result does not preclude SARS-Cov-2 infection and should not be used as the sole basis for treatment or other patient management decisions. A negative result may occur with  improper specimen collection/handling, submission of specimen other than nasopharyngeal swab, presence of viral mutation(s) within the areas targeted by this assay, and inadequate number of viral copies (<131 copies/mL). A negative result must be combined with clinical observations, patient history, and epidemiological information. The expected result is Negative. Fact Sheet for Patients:  PinkCheek.be Fact Sheet for Healthcare Providers:  GravelBags.it This test is not yet ap proved or cleared by the [place] FDA and  has been authorized for detection and/or diagnosis of SARS-CoV-2 by FDA under an Emergency Use Authorization (EUA). This EUA will remain  in effect (meaning this test can be used) for the duration of the COVID-19 declaration under Section 564(b)(1) of the Act, 21 U.S.C. section 360bbb-3(b)(1), unless the authorization is terminated or revoked sooner.    Influenza A by PCR NEGATIVE NEGATIVE Final   Influenza B by PCR NEGATIVE NEGATIVE Final    Comment: (NOTE) The Xpert Xpress SARS-CoV-2/FLU/RSV assay is intended as an aid in  the diagnosis of influenza from Nasopharyngeal swab specimens and  should not be used as a sole basis for treatment. Nasal washings and  aspirates are unacceptable for Xpert Xpress SARS-CoV-2/FLU/RSV  testing. Fact Sheet for Patients: PinkCheek.be Fact Sheet  for Healthcare Providers: GravelBags.it This test is not yet approved or cleared by the [place] FDA and  has been authorized for detection and/or diagnosis of SARS-CoV-2 by  FDA under an Emergency Use Authorization (EUA). This EUA will remain  in effect (meaning this test can be used) for the duration of the  Covid-19 declaration under Section 564(b)(1) of the Act, 21  U.S.C. section 360bbb-3(b)(1), unless the authorization is  terminated or revoked. Performed at [facility], [place] [address], [place], [place] [address]      Labs: BNP (last 3 results) Recent Labs    03/26/19 2047 05/06/19 1430  BNP 124.5* [id number]*   Basic Metabolic Panel: Recent Labs  Lab 05/15/19 1512 05/16/19 0656  NA 133* 133*  K 4.5 4.1  CL 101 100  CO2 22 26  GLUCOSE 100* 99  BUN 14 11  CREATININE 1.11 0.94  CALCIUM 7.8* 8.0*  MG 1.8  --    Liver Function Tests: Recent Labs  Lab 05/15/19 1512 05/16/19 0656  AST 68* 66*  ALT 43 42  ALKPHOS 64 62  BILITOT 4.4* 4.1*  PROT 6.0* 6.5  ALBUMIN 1.4* 1.7*   Recent Labs  Lab 05/15/19 1512  LIPASE 47   Recent Labs  Lab 05/15/19 1512  AMMONIA 20   CBC: Recent Labs  Lab 05/15/19 1512 05/15/19 1911 05/16/19 0656  WBC 7.0  --  7.0  NEUTROABS 5.7  --   --   HGB 8.8* 9.5* 9.2*  HCT 30.6* 32.5* 31.4*  MCV 87.7  --  86.7  PLT 78*  --  79*   Cardiac Enzymes: No results for input(s): CKTOTAL, CKMB, CKMBINDEX, TROPONINI in the last 168 hours. BNP: Invalid input(s): POCBNP CBG: Recent Labs  Lab 05/16/19 0724  GLUCAP 98   D-Dimer No results for input(s): DDIMER in the last 72 hours. Hgb A1c No results for input(s): HGBA1C in the last 72 hours. Lipid Profile No results for input(s): CHOL, HDL, LDLCALC, TRIG,  CHOLHDL, LDLDIRECT in the last 72 hours. Thyroid function studies No results for input(s): TSH, T4TOTAL, T3FREE, THYROIDAB in the last 72 hours.  Invalid input(s): FREET3 Anemia work  up Recent Labs    05/15/19 1911  VITAMINB12 2,038*  FOLATE 8.1  FERRITIN 80  TIBC 249*  IRON 16*  RETICCTPCT 4.3*   Urinalysis    Component Value Date/Time   COLORURINE AMBER (A) 05/15/2019 2049   APPEARANCEUR CLEAR 05/15/2019 2049   LABSPEC 1.041 (H) 05/15/2019 2049   PHURINE 5.0 05/15/2019 2049   GLUCOSEU NEGATIVE 05/15/2019 2049   HGBUR MODERATE (A) 05/15/2019 2049   BILIRUBINUR NEGATIVE 05/15/2019 2049   KETONESUR NEGATIVE 05/15/2019 2049   PROTEINUR NEGATIVE 05/15/2019 2049   UROBILINOGEN 1.0 12/07/2014 1042   NITRITE NEGATIVE 05/15/2019 2049   LEUKOCYTESUR NEGATIVE 05/15/2019 2049   Sepsis Labs Invalid input(s): PROCALCITONIN,  WBC,  LACTICIDVEN Microbiology Recent Results (from the past 240 hour(s))  Respiratory Panel by RT PCR (Flu A&B, Covid) - Nasopharyngeal Swab     Status: None   Collection Time: 05/15/19  5:19 PM   Specimen: Nasopharyngeal Swab  Result Value Ref Range Status   SARS Coronavirus 2 by RT PCR NEGATIVE NEGATIVE Final    Comment: (NOTE) SARS-CoV-2 target nucleic acids are NOT DETECTED. The SARS-CoV-2 RNA is generally detectable in upper respiratoy specimens during the acute phase of infection. The lowest concentration of SARS-CoV-2 viral copies this assay can detect is 131 copies/mL. A negative result does not preclude SARS-Cov-2 infection and should not be used as the sole basis for treatment or other patient management decisions. A negative result may occur with  improper specimen collection/handling, submission of specimen other than nasopharyngeal swab, presence of viral mutation(s) within the areas targeted by this assay, and inadequate number of viral copies (<131 copies/mL). A negative result must be combined with clinical observations, patient history, and epidemiological information. The expected result is Negative. Fact Sheet for Patients:  PinkCheek.be Fact Sheet for Healthcare Providers:   GravelBags.it This test is not yet ap proved or cleared by the [place] FDA and  has been authorized for detection and/or diagnosis of SARS-CoV-2 by FDA under an Emergency Use Authorization (EUA). This EUA will remain  in effect (meaning this test can be used) for the duration of the COVID-19 declaration under Section 564(b)(1) of the Act, 21 U.S.C. section 360bbb-3(b)(1), unless the authorization is terminated or revoked sooner.    Influenza A by PCR NEGATIVE NEGATIVE Final   Influenza B by PCR NEGATIVE NEGATIVE Final    Comment: (NOTE) The Xpert Xpress SARS-CoV-2/FLU/RSV assay is intended as an aid in  the diagnosis of influenza from Nasopharyngeal swab specimens and  should not be used as a sole basis for treatment. Nasal washings and  aspirates are unacceptable for Xpert Xpress SARS-CoV-2/FLU/RSV  testing. Fact Sheet for Patients: PinkCheek.be Fact Sheet for Healthcare Providers: GravelBags.it This test is not yet approved or cleared by the [place] FDA and  has been authorized for detection and/or diagnosis of SARS-CoV-2 by  FDA under an Emergency Use Authorization (EUA). This EUA will remain  in effect (meaning this test can be used) for the duration of the  Covid-19 declaration under Section 564(b)(1) of the Act, 21  U.S.C. section 360bbb-3(b)(1), unless the authorization is  terminated or revoked. Performed at [facility], [place] [address], [place], [place] [address]      Time coordinating discharge: 40 minutes  SIGNED:   Barb Merino, MD  [facility] 05/17/2019,  10:11 AM

## 2019-05-17 NOTE — Progress Notes (Signed)
D/c to [facility].  Report called to RN receiving patient at [phone].  Maye Hides RN

## 2019-05-17 NOTE — TOC Transition Note (Signed)
Transition of Care [facility]) - CM/SW Discharge Note   Patient Details  Name: [name] MRN: [id number] Date of Birth: 1950/10/11  Transition of Care [facility]) CM/SW Contact:  Gabrielle Dare Phone Number: 05/17/2019, 10:28 AM   Clinical Narrative:     Patient will Discharge To: [facility] Anticipated DC Date: May 17, 2019 Family Notified:yes, step daughter [name], [phone] Transport By: Corey Harold   Per MD patient ready for DC to [facility] . RN, patient, patient's family, and facility notified of DC.  Discharge Summary sent to facility. RN given number for report [phone]). DC packet on chart. Ambulance transport requested for patient for 1:00pm.  CSW signing off.  Reed Breech LCSWA [phone]    Final next level of care: [place] Barriers to Discharge: No Barriers Identified   Patient Goals and CMS Choice     Choice offered to / list presented to : Adult Children  Discharge Placement              Patient chooses bed at: [facility]) Patient to be transferred to facility by: [place] Name of family member notified: [name] Patient and family notified of of transfer: 05/17/19  Discharge Plan and Services   Discharge Planning Services: CM Consult Post Acute Care Choice: Durable Medical Equipment            DME Agency: NA       HH Arranged: NA          Social Determinants of Health (SDOH) Interventions     Readmission Risk Interventions No flowsheet data found.

## 2019-05-22 LAB — VITAMIN B1: Vitamin B1 (Thiamine): 79.2 nmol/L (ref 66.5–200.0)

## 2019-06-24 DEATH — deceased

## 2022-04-19 IMAGING — CT CT ABD-PELV W/ CM
2 of 5 series · 14 of 46 positions shown, 16 images · IV contrast (omnipaque)
Comparison: CT 02/16/2016

CLINICAL DATA: Altered mental status, unclear cause, nonlocalized

EXAM:
CT ABDOMEN AND PELVIS WITH CONTRAST
TECHNIQUE: Multidetector CT imaging of the abdomen and pelvis was performed
using the standard protocol following bolus administration of
intravenous contrast.
CONTRAST:  100mL OMNIPAQUE IOHEXOL 300 MG/ML  SOLN

[Series 3: abdomen 5.0 (person_name) · axial · 0.95mm/px · z∈[-445,-5]mm · 11 of 102 slices shown, 13 images]
[im 7/102  soft-tissue]
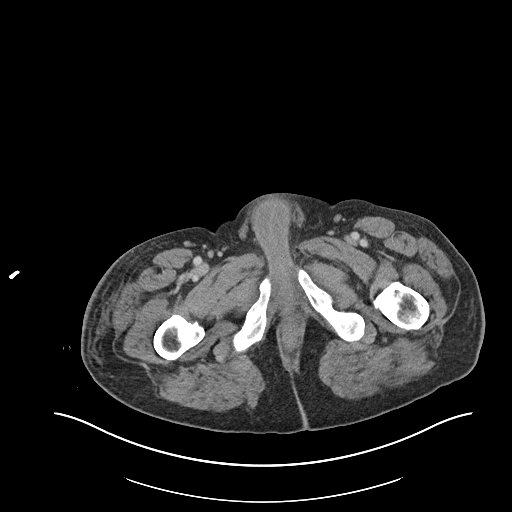
[im 7/102  bone]
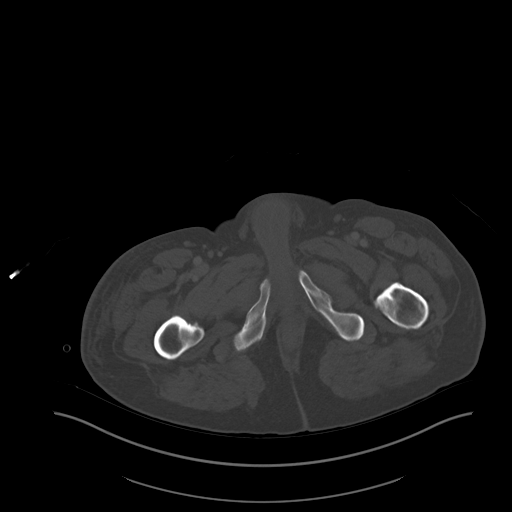
[im 19/102  soft-tissue]
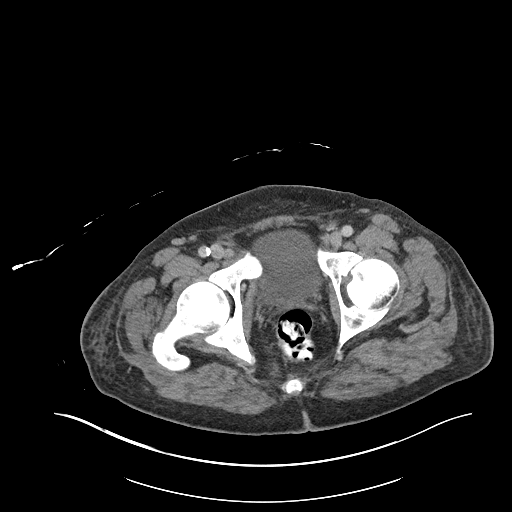
[im 26/102  soft-tissue]
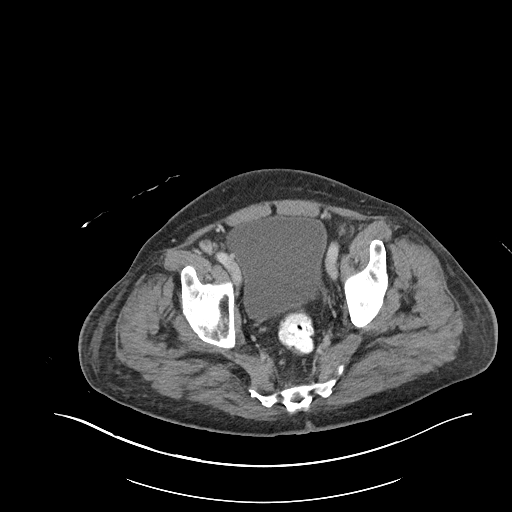
[im 32/102  soft-tissue]
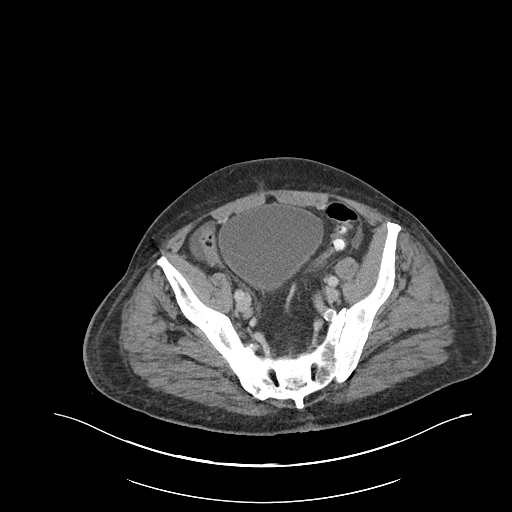
[im 45/102  soft-tissue]
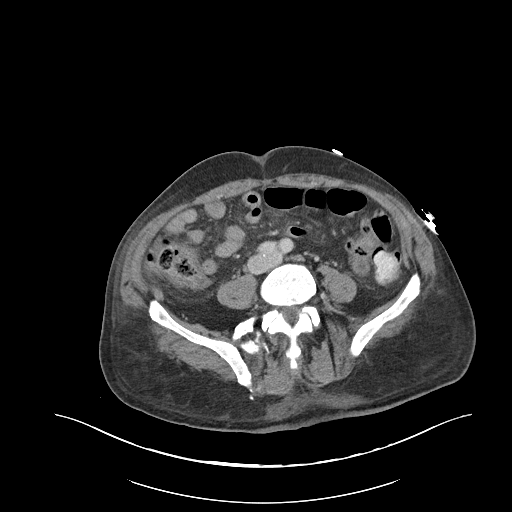
[im 51/102  soft-tissue]
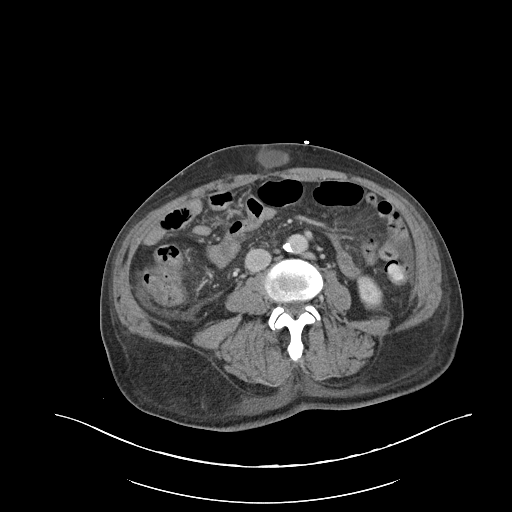
[im 57/102  soft-tissue]
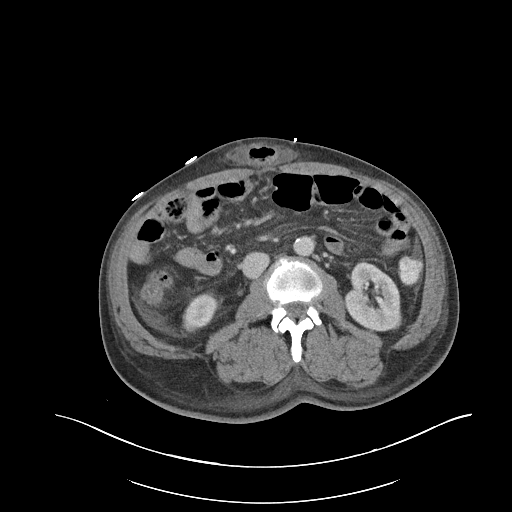
[im 70/102  soft-tissue]
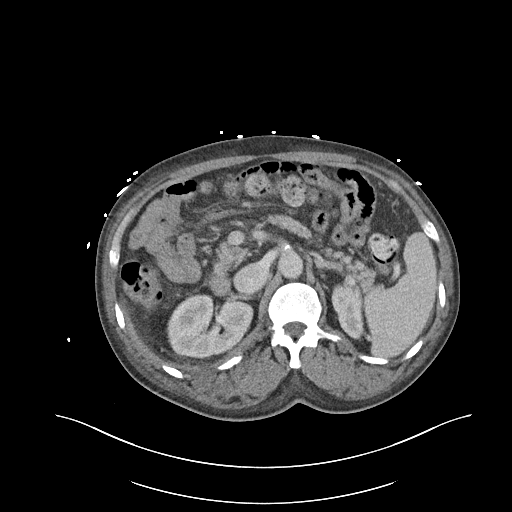
[im 76/102  soft-tissue]
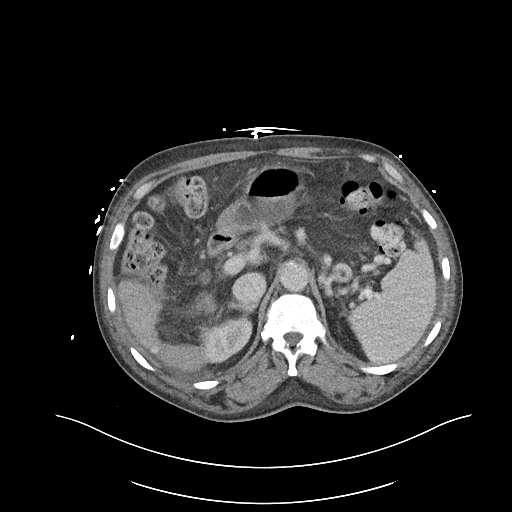
[im 76/102  bone]
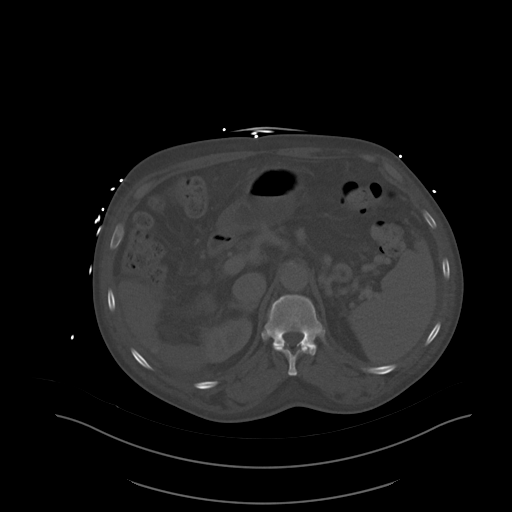
[im 83/102  soft-tissue]
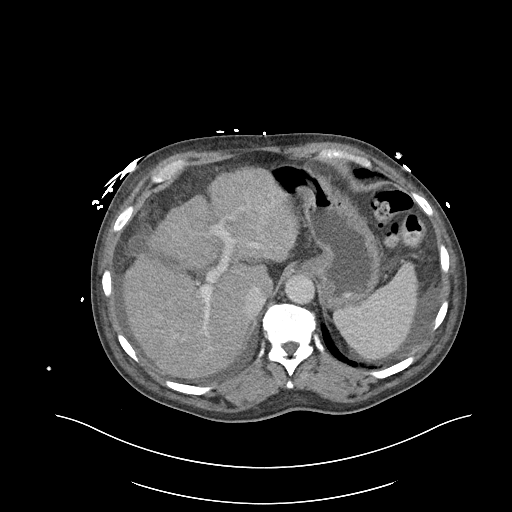
[im 95/102  soft-tissue]
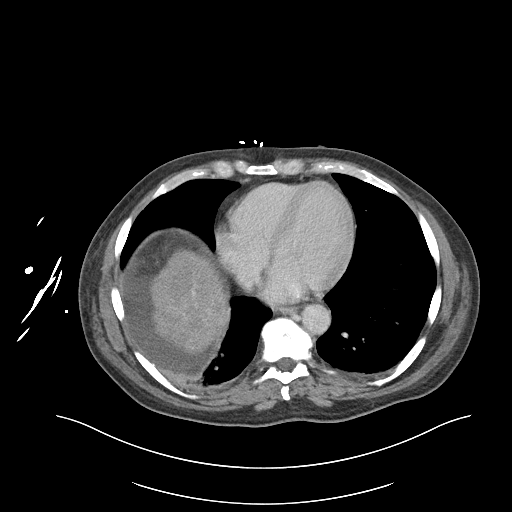

[Series 6: abdomen 3.0 (person_name) · coronal · 0.85mm/px · 3 of 113 slices shown]
[im 38/113  soft-tissue]
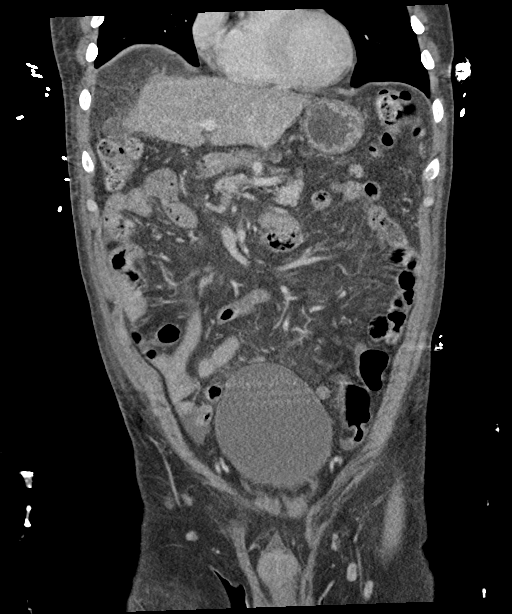
[im 50/113  soft-tissue]
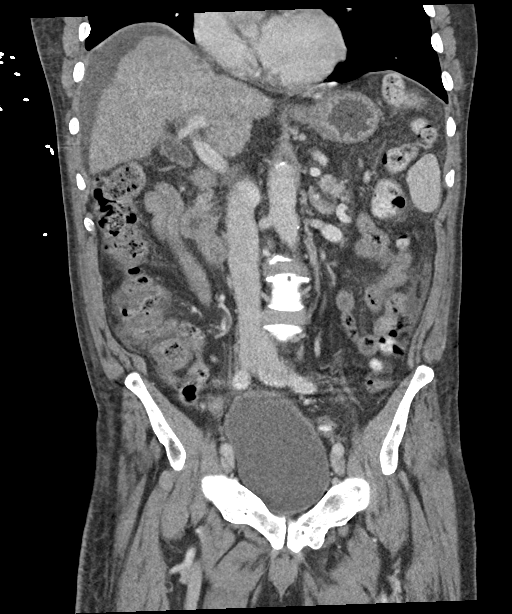
[im 63/113  soft-tissue]
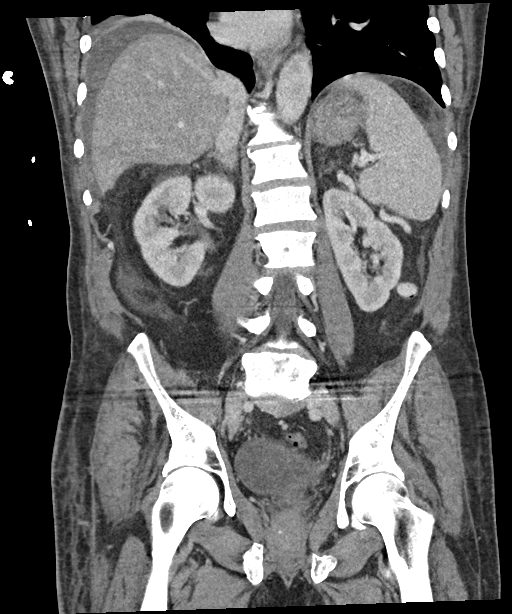

[14 of 46 positions shown; findings below may reference images not displayed]

FINDINGS: Lower chest: Trace bilateral effusions, right slightly greater than
left with adjacent atelectatic changes. Normal heart size. No
pericardial effusion.

Hepatobiliary: Heterogeneity of the hepatic attenuation with a
nodular liver surface contour compatible with cirrhosis. There is an
ill-defined region some faint peripheral contrast enhancement and
centrally isoattenuating tissue in segment III of the liver
measuring approximately 3.2 cm (coronal 6/43). This finding likely
corresponds to the isoechoic lesion seen on recent abdominal
ultrasound. There is a more centrally hypoattenuating focus in the
posterior right lobe liver measuring 1.5 cm in size which is not
clearly present on comparison studies. Minimal gallbladder wall
thickening without focal pericholecystic inflammation. Finding is
nonspecific in the setting of intrinsic liver disease. No visible
calcified gallstones or biliary ductal dilatation.

Pancreas: Diffuse mild pancreatic atrophy. No focal peripancreatic
inflammation or ductal dilatation.

Spleen: Borderline splenomegaly. Few punctate calcifications in the
spleen likely reflect sequela of prior granulomatous disease.

Adrenals/Urinary Tract: Normal adrenal glands. Kidneys enhance and
excrete symmetrically. No concerning renal mass, urolith or
hydronephrosis. Urinary bladder is unremarkable.

Stomach/Bowel: Distal esophagus, stomach and duodenum are
unremarkable. A small portion bowels is seen protruding into a small
umbilical hernia anteriorly with loculated free fluid within the
hernia sac, hernia defect measures approximately 2 x 1.6 cm in
transverse by craniocaudal dimension. No evidence of high-grade
upstream obstruction. Some mild thickening of the bowel is
nonspecific but could suggest some early vascular compromise. More
distal small bowel is unremarkable. There is some mild edematous
mural thickening predominately of the proximal colon to the level of
the hepatic flexure. Nonspecific given additional features liver
disease.

Vascular/Lymphatic: Atherosclerotic plaque within the normal caliber
aorta. No suspicious or enlarged lymph nodes in the included
lymphatic chains.

Reproductive: Coarse eccentric calcification of the prostate. No
concerning abnormalities of the prostate or seminal vesicles.

Other: Small to moderate volume ascites predominantly within the
subphrenic spaces. Bowel containing ventral hernia arising 3.1 cm
superior to the umbilicus. Better detailed in the bowel section
above. No other bowel containing hernia is seen. Mild
circumferential body wall edema. No abdominopelvic free air.

Musculoskeletal: Multilevel degenerative changes are present in the
imaged portions of the spine. Prior fusion across the SI joints.
Bilateral L5 pars defects are noted with grade 1 anterolisthesis L5
on S1. Multilevel degenerative changes are present in the imaged
portions of the spine. Degenerative changes most pronounced at the
L5-S1 level with vacuum disc phenomenon. Remote posttraumatic
changes of the bilateral pubic rami and pubic bodies.
IMPRESSION: 1. Cirrhosis with a 3.2 cm ill-defined region in segment III of the
liver likely corresponds to the isoechoic lesion seen on recent
abdominal ultrasound. Such finding is highly worrisome for potential
malignancy in the setting of intrinsic liver disease.
2. Centrally hypoattenuating focus in the posterior right lobe liver
measuring 1.5 cm in size which is not clearly present on comparison
studies. While this could reflect benign hepatic cysts, a more
insidious lesion such as HCC could have a similar appearance.
3. Small to moderate volume ascites predominantly within the
subphrenic spaces. Mild circumferential body wall edema.
4. Small bowel containing supraumbilical ventral hernia without
resulting obstruction but some mild thickening and hyperemia of the
bowel is nonspecific but could suggest some early vascular
compromise. Correlate with exam findings and potential
reduceability.
5. Features of anasarca with bilateral effusions, ascites, and body
wall edema.
6. Minimal gallbladder wall thickening without focal pericholecystic
inflammation. Finding is nonspecific in the setting of intrinsic
liver disease.
7. Bilateral L5 pars defects with grade 1 anterolisthesis L5 on S1.
8. Aortic Atherosclerosis ([id number]-VIX.X).

These results were called by telephone at the time of interpretation
on 05/15/2019 at [DATE] to provider NECTOR GATITO , who verbally
acknowledged these results.
